# Patient Record
Sex: Male | Born: 1945
Health system: Southern US, Community
[De-identification: ages and names within clinical notes are randomized; demographics above are authoritative.]

## PROBLEM LIST (undated history)

## (undated) DIAGNOSIS — R7303 Prediabetes: Secondary | ICD-10-CM

## (undated) DIAGNOSIS — G479 Sleep disorder, unspecified: Secondary | ICD-10-CM

## (undated) DIAGNOSIS — J45909 Unspecified asthma, uncomplicated: Secondary | ICD-10-CM

## (undated) DIAGNOSIS — F32A Depression, unspecified: Secondary | ICD-10-CM

## (undated) DIAGNOSIS — F329 Major depressive disorder, single episode, unspecified: Secondary | ICD-10-CM

## (undated) DIAGNOSIS — F419 Anxiety disorder, unspecified: Secondary | ICD-10-CM

## (undated) DIAGNOSIS — E785 Hyperlipidemia, unspecified: Secondary | ICD-10-CM

## (undated) DIAGNOSIS — Z87438 Personal history of other diseases of male genital organs: Secondary | ICD-10-CM

## (undated) HISTORY — PX: CIRCUMCISION: SUR203

## (undated) HISTORY — DX: Depression, unspecified: F32.A

## (undated) HISTORY — DX: Prediabetes: R73.03

## (undated) HISTORY — DX: Major depressive disorder, single episode, unspecified: F32.9

## (undated) HISTORY — PX: TONSILLECTOMY: SUR1361

## (undated) HISTORY — PX: OTHER SURGICAL HISTORY: SHX169

## (undated) HISTORY — DX: Personal history of other diseases of male genital organs: Z87.438

## (undated) HISTORY — DX: Unspecified asthma, uncomplicated: J45.909

## (undated) HISTORY — DX: Sleep disorder, unspecified: G47.9

## (undated) HISTORY — DX: Hyperlipidemia, unspecified: E78.5

## (undated) HISTORY — DX: Anxiety disorder, unspecified: F41.9

---

## 2011-08-29 DIAGNOSIS — F329 Major depressive disorder, single episode, unspecified: Secondary | ICD-10-CM | POA: Diagnosis not present

## 2011-09-08 DIAGNOSIS — N400 Enlarged prostate without lower urinary tract symptoms: Secondary | ICD-10-CM | POA: Diagnosis not present

## 2011-10-10 DIAGNOSIS — L570 Actinic keratosis: Secondary | ICD-10-CM | POA: Diagnosis not present

## 2011-10-10 DIAGNOSIS — L821 Other seborrheic keratosis: Secondary | ICD-10-CM | POA: Diagnosis not present

## 2011-11-20 DIAGNOSIS — E559 Vitamin D deficiency, unspecified: Secondary | ICD-10-CM | POA: Diagnosis not present

## 2011-11-20 DIAGNOSIS — Z79899 Other long term (current) drug therapy: Secondary | ICD-10-CM | POA: Diagnosis not present

## 2011-11-20 DIAGNOSIS — Z125 Encounter for screening for malignant neoplasm of prostate: Secondary | ICD-10-CM | POA: Diagnosis not present

## 2011-11-20 DIAGNOSIS — E782 Mixed hyperlipidemia: Secondary | ICD-10-CM | POA: Diagnosis not present

## 2011-11-24 DIAGNOSIS — F329 Major depressive disorder, single episode, unspecified: Secondary | ICD-10-CM | POA: Diagnosis not present

## 2011-12-14 DIAGNOSIS — H2510 Age-related nuclear cataract, unspecified eye: Secondary | ICD-10-CM | POA: Diagnosis not present

## 2011-12-20 DIAGNOSIS — E559 Vitamin D deficiency, unspecified: Secondary | ICD-10-CM | POA: Diagnosis not present

## 2011-12-20 DIAGNOSIS — Z79899 Other long term (current) drug therapy: Secondary | ICD-10-CM | POA: Diagnosis not present

## 2011-12-20 DIAGNOSIS — E782 Mixed hyperlipidemia: Secondary | ICD-10-CM | POA: Diagnosis not present

## 2011-12-20 DIAGNOSIS — Z125 Encounter for screening for malignant neoplasm of prostate: Secondary | ICD-10-CM | POA: Diagnosis not present

## 2012-01-05 DIAGNOSIS — R748 Abnormal levels of other serum enzymes: Secondary | ICD-10-CM | POA: Diagnosis not present

## 2012-01-05 DIAGNOSIS — R7301 Impaired fasting glucose: Secondary | ICD-10-CM | POA: Diagnosis not present

## 2012-01-22 DIAGNOSIS — F329 Major depressive disorder, single episode, unspecified: Secondary | ICD-10-CM | POA: Diagnosis not present

## 2012-03-06 DIAGNOSIS — R7301 Impaired fasting glucose: Secondary | ICD-10-CM | POA: Diagnosis not present

## 2012-03-06 DIAGNOSIS — R7401 Elevation of levels of liver transaminase levels: Secondary | ICD-10-CM | POA: Diagnosis not present

## 2012-05-09 DIAGNOSIS — F329 Major depressive disorder, single episode, unspecified: Secondary | ICD-10-CM | POA: Diagnosis not present

## 2012-05-13 DIAGNOSIS — IMO0002 Reserved for concepts with insufficient information to code with codable children: Secondary | ICD-10-CM | POA: Diagnosis not present

## 2012-07-02 ENCOUNTER — Encounter (HOSPITAL_COMMUNITY): Payer: Self-pay | Admitting: *Deleted

## 2012-07-02 ENCOUNTER — Emergency Department (HOSPITAL_COMMUNITY)
Admission: EM | Admit: 2012-07-02 | Discharge: 2012-07-02 | Disposition: A | Discharge status: Home / Self Care | Payer: Medicare Other | Attending: Emergency Medicine | Admitting: Emergency Medicine

## 2012-07-02 ENCOUNTER — Emergency Department (HOSPITAL_COMMUNITY): Payer: Medicare Other

## 2012-07-02 DIAGNOSIS — K5730 Diverticulosis of large intestine without perforation or abscess without bleeding: Secondary | ICD-10-CM | POA: Diagnosis not present

## 2012-07-02 DIAGNOSIS — R339 Retention of urine, unspecified: Secondary | ICD-10-CM | POA: Diagnosis not present

## 2012-07-02 DIAGNOSIS — I10 Essential (primary) hypertension: Secondary | ICD-10-CM | POA: Insufficient documentation

## 2012-07-02 DIAGNOSIS — L570 Actinic keratosis: Secondary | ICD-10-CM | POA: Diagnosis not present

## 2012-07-02 DIAGNOSIS — N400 Enlarged prostate without lower urinary tract symptoms: Secondary | ICD-10-CM | POA: Diagnosis not present

## 2012-07-02 DIAGNOSIS — K7689 Other specified diseases of liver: Secondary | ICD-10-CM | POA: Diagnosis not present

## 2012-07-02 DIAGNOSIS — R1031 Right lower quadrant pain: Secondary | ICD-10-CM | POA: Diagnosis not present

## 2012-07-02 DIAGNOSIS — Z79899 Other long term (current) drug therapy: Secondary | ICD-10-CM | POA: Diagnosis not present

## 2012-07-02 DIAGNOSIS — R110 Nausea: Secondary | ICD-10-CM | POA: Insufficient documentation

## 2012-07-02 DIAGNOSIS — L738 Other specified follicular disorders: Secondary | ICD-10-CM | POA: Diagnosis not present

## 2012-07-02 DIAGNOSIS — R109 Unspecified abdominal pain: Secondary | ICD-10-CM

## 2012-07-02 LAB — COMPREHENSIVE METABOLIC PANEL
ALT: 74 U/L — ABNORMAL HIGH (ref 0–53)
AST: 85 U/L — ABNORMAL HIGH (ref 0–37)
Albumin: 4 g/dL (ref 3.5–5.2)
Calcium: 9.5 mg/dL (ref 8.4–10.5)
Creatinine, Ser: 1.44 mg/dL — ABNORMAL HIGH (ref 0.50–1.35)
Sodium: 138 mEq/L (ref 135–145)
Total Protein: 7.5 g/dL (ref 6.0–8.3)

## 2012-07-02 LAB — CBC WITH DIFFERENTIAL/PLATELET
Basophils Absolute: 0.1 10*3/uL (ref 0.0–0.1)
Basophils Relative: 1 % (ref 0–1)
Eosinophils Absolute: 0.3 10*3/uL (ref 0.0–0.7)
Eosinophils Relative: 4 % (ref 0–5)
Lymphocytes Relative: 23 % (ref 12–46)
MCH: 31.8 pg (ref 26.0–34.0)
MCHC: 34.6 g/dL (ref 30.0–36.0)
MCV: 92 fL (ref 78.0–100.0)
Platelets: 292 10*3/uL (ref 150–400)
RDW: 12.9 % (ref 11.5–15.5)
WBC: 6.3 10*3/uL (ref 4.0–10.5)

## 2012-07-02 LAB — URINALYSIS, ROUTINE W REFLEX MICROSCOPIC
Bilirubin Urine: NEGATIVE
Hgb urine dipstick: NEGATIVE
Nitrite: NEGATIVE
Specific Gravity, Urine: 1.011 (ref 1.005–1.030)
pH: 7 (ref 5.0–8.0)

## 2012-07-02 MED ORDER — ONDANSETRON HCL 8 MG PO TABS: 8.0000 mg | ORAL_TABLET | Freq: Three times a day (TID) | ORAL | Status: DC | PRN

## 2012-07-02 MED ORDER — IOHEXOL 300 MG/ML  SOLN
100.0000 mL | Freq: Once | INTRAMUSCULAR | Status: AC | PRN
  Administered 2012-07-02: 100 mL via INTRAVENOUS

## 2012-07-02 MED ORDER — MORPHINE SULFATE 4 MG/ML IJ SOLN
4.0000 mg | Freq: Once | INTRAMUSCULAR | Status: AC
  Administered 2012-07-02: 4 mg via INTRAVENOUS
  Filled 2012-07-02: qty 1

## 2012-07-02 MED ORDER — ONDANSETRON HCL 4 MG/2ML IJ SOLN
4.0000 mg | Freq: Once | INTRAMUSCULAR | Status: AC
  Administered 2012-07-02: 4 mg via INTRAVENOUS
  Filled 2012-07-02: qty 2

## 2012-07-02 MED ORDER — LORAZEPAM 2 MG/ML IJ SOLN
0.5000 mg | Freq: Once | INTRAMUSCULAR | Status: AC
  Administered 2012-07-02: 0.5 mg via INTRAVENOUS
  Filled 2012-07-02: qty 1

## 2012-07-02 MED ORDER — FENTANYL CITRATE 0.05 MG/ML IJ SOLN
50.0000 ug | Freq: Once | INTRAMUSCULAR | Status: AC
  Administered 2012-07-02: 50 ug via INTRAVENOUS
  Filled 2012-07-02: qty 2

## 2012-07-02 MED ORDER — OXYCODONE-ACETAMINOPHEN 5-325 MG PO TABS
1.0000 | ORAL_TABLET | ORAL | Status: DC | PRN
Start: 2012-07-02 — End: 2013-07-28

## 2012-07-02 NOTE — ED Notes (Signed)
MD at bedside. 

## 2012-07-02 NOTE — ED Provider Notes (Signed)
History     CSN: [id number]  Arrival date & time 07/02/12  1932   First MD Initiated Contact with Patient 07/02/12 2020      No chief complaint on file.   (Consider location/radiation/quality/duration/timing/severity/associated sxs/prior treatment) HPI History provided by pt.   Pt has had intermittent, non-radiating, sharp pain in RLQ for the past three days.  Gradually increasing in intensity and frequency.  Seems to be aggravated by laying flat as well as movement.  Associated w/ nausea and urinary retention.  Denies fever, anorexia, change in bowels.  H/o BPH and HTN.  No past abd surgeries.  No recent trauma or heavy lifting.   History reviewed. No pertinent past medical history.  History reviewed. No pertinent past surgical history.  No family history on file.  History  Substance Use Topics  . Smoking status: Never Smoker   . Smokeless tobacco: Not on file  . Alcohol Use: 1.8 oz/week    3 Glasses of wine per week      Review of Systems  All other systems reviewed and are negative.    Allergies  Review of patient's allergies indicates no known allergies.  Home Medications   Current Outpatient Rx  Name  Route  Sig  Dispense  Refill  . ALFUZOSIN HCL ER 10 MG PO TB24   Oral   Take 10 mg by mouth daily.         . FENOFIBRATE 160 MG PO TABS   Oral   Take 160 mg by mouth daily.         Marland Kitchen FINASTERIDE 5 MG PO TABS   Oral   Take 5 mg by mouth daily.         Marland Kitchen HYDROXYZINE HCL 10 MG PO TABS   Oral   Take 10 mg by mouth 3 (three) times daily as needed.         Marland Kitchen LORAZEPAM 1 MG PO TABS   Oral   Take 1 mg by mouth at bedtime.         Marland Kitchen VITAMIN D (ERGOCALCIFEROL) 50000 UNITS PO CAPS   Oral   Take 50,000 Units by mouth every 7 (seven) days. Take on Sunday           BP 136/91  Pulse 102  Temp 98 F (36.7 C) (Oral)  Resp 19  SpO2 94%  Physical Exam  Nursing note and vitals reviewed. Constitutional: He is oriented to person, place, and time.  He appears well-developed and well-nourished. No distress.  HENT:  Head: Normocephalic and atraumatic.  Eyes:       Normal appearance  Neck: Normal range of motion.  Cardiovascular: Normal rate and regular rhythm.   Pulmonary/Chest: Effort normal and breath sounds normal. No respiratory distress.  Abdominal: Soft. Bowel sounds are normal. He exhibits no distension and no mass. There is no rebound and no guarding.       Slight tenderness RLQ  Genitourinary:       No CVA tenderness  Musculoskeletal: Normal range of motion.  Neurological: He is alert and oriented to person, place, and time.  Skin: Skin is warm and dry. No rash noted.  Psychiatric: He has a normal mood and affect. His behavior is normal.    ED Course  Procedures (including critical care time)  Labs Reviewed  CBC WITH DIFFERENTIAL - Abnormal; Notable for the following:    Monocytes Relative 14 (*)     All other components within normal limits  COMPREHENSIVE METABOLIC PANEL -  Abnormal; Notable for the following:    Glucose, Bld 118 (*)     Creatinine, Ser 1.44 (*)     AST 85 (*)     ALT 74 (*)     GFR calc non Af Amer 48 (*)     GFR calc Af Amer 56 (*)     All other components within normal limits  URINALYSIS, ROUTINE W REFLEX MICROSCOPIC   Ct Abdomen Pelvis W Contrast  07/02/2012  *RADIOLOGY REPORT*  Clinical Data: Right lower abdominal pain.  Nausea.  Evaluate for appendicitis.  CT ABDOMEN AND PELVIS WITH CONTRAST  Technique:  Multidetector CT imaging of the abdomen and pelvis was performed following the standard protocol during bolus administration of intravenous contrast.  Contrast: OMNIPAQUE IOHEXOL 300 MG/ML  SOLN  Comparison: No priors.  Findings:  Lung Bases: Unremarkable.  Abdomen/Pelvis:  Diffusely decreased attenuation throughout the hepatic parenchyma is compatible with hepatic steatosis, with some focal fatty sparing adjacent to the gallbladder fossa.  The appearance of the gallbladder, pancreas,  spleen, bilateral adrenal glands and the right kidney is unremarkable.  2.2 cm simple cyst in the upper pole left kidney.  Subcentimeter low attenuation lesion in the lower pole of the left kidney is too small to definitively characterize, but is statistically likely to represent a small cyst.  There also appear to be some small peripelvic cysts in the left renal hilum.  The appendix is normal.  No ascites or pneumoperitoneum and no pathologic distension of small bowel.  No definite pathologic lymphadenopathy identified within the abdomen or pelvis.  There are a few colonic diverticula, without surrounding inflammatory changes to suggest an acute diverticulitis at this time.  Hypertrophy of the median lobe of the prostate gland is noted.  Urinary bladder is unremarkable in appearance.  Musculoskeletal: There are no aggressive appearing lytic or blastic lesions noted in the visualized portions of the skeleton.  IMPRESSION: 1.  No acute abnormality in the abdomen or pelvis to account for the patient's symptoms.  Specifically, the appendix is normal. 2.  Hepatic steatosis. 3.  Mild colonic diverticulosis without findings to suggest acute diverticulitis. 4.  Additional incidental findings, as above.   Original Report Authenticated By: Trudie Reed, M.D.      1. Abdominal pain       MDM  [phone] M w/ h/o BPH and HTN, otherwise healthy, presents w/ c/o non-traumatic RLQ pain.  Low clinical suspicion for appendicitis, but CT abd/pelvis ordered d/t patient's age and his level of concern.  Labs sig for mild elevation of transaminases, likely secondary to alcohol intake, and CT neg for acute pathology. Results discussed w/ pt.  He is frustrated that we were unable to determine etiology of pain, but I suspect he has a muscle strain.  Prescribed percocet and recommended he take his daily prednisone, apply heat and avoid activities that aggravate pain.  He has a f/u appointment scheduled with his PCP tomorrow.   Referred to GI for persistent sx as well.  Return precautions discussed.         Otilio Miu, PA-C 07/03/12 0106

## 2012-07-02 NOTE — ED Provider Notes (Signed)
Eduardo Watts is a 66 y.o. male with right lower abdominal pain, for 3 days associated with nausea. No change in bowel or urinary habits. Abdomen is soft is tender. The right lower quadrant without rebound, tenderness.  ED evaluation is reassuring for lack of acute intra-abdominal processes. Doubt metabolic instability, serious bacterial infection or impending vascular collapse; the patient is stable for discharge.  Medical screening examination/treatment/procedure(s) were conducted as a shared visit with non-physician practitioner(s) and myself.  I personally evaluated the patient during the encounter  Flint Melter, MD 07/03/12 [phone]

## 2012-07-02 NOTE — ED Notes (Signed)
Pt c/o lower right localized abd pain x 3 days; worse x 3 hours; nausea; increased urination; pain does not radiate to back or groin

## 2012-07-02 NOTE — ED Notes (Signed)
Patient transported to CT 

## 2012-07-03 DIAGNOSIS — E119 Type 2 diabetes mellitus without complications: Secondary | ICD-10-CM | POA: Diagnosis not present

## 2012-07-03 DIAGNOSIS — R7401 Elevation of levels of liver transaminase levels: Secondary | ICD-10-CM | POA: Diagnosis not present

## 2012-07-03 DIAGNOSIS — R1031 Right lower quadrant pain: Secondary | ICD-10-CM | POA: Diagnosis not present

## 2012-07-06 NOTE — ED Provider Notes (Signed)
Medical screening examination/treatment/procedure(s) were performed by non-physician practitioner and as supervising physician I was immediately available for consultation/collaboration.  Flint Melter, MD 07/06/12 [phone]

## 2012-07-11 DIAGNOSIS — M720 Palmar fascial fibromatosis [Dupuytren]: Secondary | ICD-10-CM | POA: Diagnosis not present

## 2012-07-17 DIAGNOSIS — M545 Low back pain: Secondary | ICD-10-CM | POA: Diagnosis not present

## 2012-07-17 DIAGNOSIS — M47817 Spondylosis without myelopathy or radiculopathy, lumbosacral region: Secondary | ICD-10-CM | POA: Diagnosis not present

## 2012-07-17 DIAGNOSIS — M25559 Pain in unspecified hip: Secondary | ICD-10-CM | POA: Diagnosis not present

## 2012-07-17 DIAGNOSIS — IMO0002 Reserved for concepts with insufficient information to code with codable children: Secondary | ICD-10-CM | POA: Diagnosis not present

## 2012-07-29 DIAGNOSIS — F329 Major depressive disorder, single episode, unspecified: Secondary | ICD-10-CM | POA: Diagnosis not present

## 2012-10-02 DIAGNOSIS — R7401 Elevation of levels of liver transaminase levels: Secondary | ICD-10-CM | POA: Diagnosis not present

## 2012-10-02 DIAGNOSIS — E119 Type 2 diabetes mellitus without complications: Secondary | ICD-10-CM | POA: Diagnosis not present

## 2012-11-07 DIAGNOSIS — F329 Major depressive disorder, single episode, unspecified: Secondary | ICD-10-CM | POA: Diagnosis not present

## 2013-01-01 DIAGNOSIS — E559 Vitamin D deficiency, unspecified: Secondary | ICD-10-CM | POA: Diagnosis not present

## 2013-01-01 DIAGNOSIS — E119 Type 2 diabetes mellitus without complications: Secondary | ICD-10-CM | POA: Diagnosis not present

## 2013-01-01 DIAGNOSIS — R7401 Elevation of levels of liver transaminase levels: Secondary | ICD-10-CM | POA: Diagnosis not present

## 2013-01-01 DIAGNOSIS — Z125 Encounter for screening for malignant neoplasm of prostate: Secondary | ICD-10-CM | POA: Diagnosis not present

## 2013-01-01 DIAGNOSIS — I10 Essential (primary) hypertension: Secondary | ICD-10-CM | POA: Diagnosis not present

## 2013-01-01 DIAGNOSIS — R6882 Decreased libido: Secondary | ICD-10-CM | POA: Diagnosis not present

## 2013-01-13 DIAGNOSIS — H2510 Age-related nuclear cataract, unspecified eye: Secondary | ICD-10-CM | POA: Diagnosis not present

## 2013-02-14 DIAGNOSIS — I789 Disease of capillaries, unspecified: Secondary | ICD-10-CM | POA: Diagnosis not present

## 2013-02-14 DIAGNOSIS — L821 Other seborrheic keratosis: Secondary | ICD-10-CM | POA: Diagnosis not present

## 2013-02-14 DIAGNOSIS — L820 Inflamed seborrheic keratosis: Secondary | ICD-10-CM | POA: Diagnosis not present

## 2013-02-14 DIAGNOSIS — L723 Sebaceous cyst: Secondary | ICD-10-CM | POA: Diagnosis not present

## 2013-02-14 DIAGNOSIS — D1801 Hemangioma of skin and subcutaneous tissue: Secondary | ICD-10-CM | POA: Diagnosis not present

## 2013-03-28 DIAGNOSIS — F329 Major depressive disorder, single episode, unspecified: Secondary | ICD-10-CM | POA: Diagnosis not present

## 2013-04-11 DIAGNOSIS — N400 Enlarged prostate without lower urinary tract symptoms: Secondary | ICD-10-CM | POA: Diagnosis not present

## 2013-05-05 DIAGNOSIS — R7402 Elevation of levels of lactic acid dehydrogenase (LDH): Secondary | ICD-10-CM | POA: Diagnosis not present

## 2013-05-05 DIAGNOSIS — K7689 Other specified diseases of liver: Secondary | ICD-10-CM | POA: Diagnosis not present

## 2013-05-05 DIAGNOSIS — F329 Major depressive disorder, single episode, unspecified: Secondary | ICD-10-CM | POA: Diagnosis not present

## 2013-05-05 DIAGNOSIS — R7401 Elevation of levels of liver transaminase levels: Secondary | ICD-10-CM | POA: Diagnosis not present

## 2013-06-04 DIAGNOSIS — Z23 Encounter for immunization: Secondary | ICD-10-CM | POA: Diagnosis not present

## 2013-06-18 DIAGNOSIS — F329 Major depressive disorder, single episode, unspecified: Secondary | ICD-10-CM | POA: Diagnosis not present

## 2013-07-21 DIAGNOSIS — E782 Mixed hyperlipidemia: Secondary | ICD-10-CM | POA: Diagnosis not present

## 2013-07-21 DIAGNOSIS — E559 Vitamin D deficiency, unspecified: Secondary | ICD-10-CM | POA: Diagnosis not present

## 2013-07-21 DIAGNOSIS — R9431 Abnormal electrocardiogram [ECG] [EKG]: Secondary | ICD-10-CM | POA: Diagnosis not present

## 2013-07-21 DIAGNOSIS — Z0000 Encounter for general adult medical examination without abnormal findings: Secondary | ICD-10-CM | POA: Diagnosis not present

## 2013-07-21 DIAGNOSIS — I10 Essential (primary) hypertension: Secondary | ICD-10-CM | POA: Diagnosis not present

## 2013-07-21 DIAGNOSIS — E119 Type 2 diabetes mellitus without complications: Secondary | ICD-10-CM | POA: Diagnosis not present

## 2013-07-26 ENCOUNTER — Encounter: Payer: Self-pay | Admitting: Interventional Cardiology

## 2013-07-28 ENCOUNTER — Ambulatory Visit (INDEPENDENT_AMBULATORY_CARE_PROVIDER_SITE_OTHER): Payer: Medicare Other | Admitting: Interventional Cardiology

## 2013-07-28 ENCOUNTER — Encounter: Payer: Self-pay | Admitting: Cardiology

## 2013-07-28 ENCOUNTER — Encounter: Payer: Self-pay | Admitting: Interventional Cardiology

## 2013-07-28 VITALS — BP 139/90 | HR 89 | Ht 74.0 in | Wt 210.4 lb

## 2013-07-28 DIAGNOSIS — R9431 Abnormal electrocardiogram [ECG] [EKG]: Secondary | ICD-10-CM | POA: Insufficient documentation

## 2013-07-28 DIAGNOSIS — R0602 Shortness of breath: Secondary | ICD-10-CM

## 2013-07-28 DIAGNOSIS — E782 Mixed hyperlipidemia: Secondary | ICD-10-CM | POA: Diagnosis not present

## 2013-07-28 DIAGNOSIS — R5381 Other malaise: Secondary | ICD-10-CM

## 2013-07-28 LAB — BRAIN NATRIURETIC PEPTIDE: Pro B Natriuretic peptide (BNP): 28 pg/mL (ref 0.0–100.0)

## 2013-07-28 NOTE — Patient Instructions (Signed)
Your physician has requested that you have an echocardiogram. Echocardiography is a painless test that uses sound waves to create images of your heart. It provides your doctor with information about the size and shape of your heart and how well your heart's chambers and valves are working. This procedure takes approximately one hour. There are no restrictions for this procedure.  Your physician recommends that you return for lab work today for BNP.

## 2013-07-28 NOTE — Progress Notes (Signed)
Patient ID: Eduardo Watts, male   DOB: 1945/08/11, 67 y.o.   MRN: [id number]     Patient ID: Eduardo Watts MRN: [id number] DOB/AGE: May 22, 1946 67 y.o.   Referring Physician  Dr. Tenny Craw   Reason for Consultation Abnormal ECG  HPI: 73 y/o who had a routine ECG which was abnormal.  Eduardo Watts work is his most strenuous exercise.  He feels that he tires more quickly.  He attributes it to age.  He does not walk much.  He is very claustrophobic and scared of a machines that enclose him.  He did have a nuclear stress test and did not tolerate the camera about 12 years ago, so it was stopped.  He feels his heart pounding with anxiety.  He has some SHOB with working in the yard.  He was trimming some shrubs and could feel it.  His wife could feel it.  No leg swelling.  Mother had a pacer.  No stents or CABG in the family.    Current Outpatient Prescriptions  Medication Sig Dispense Refill  . alfuzosin (UROXATRAL) 10 MG 24 hr tablet Take 10 mg by mouth daily.      . B Complex Vitamins (B COMPLEX PO) Take by mouth.      . carvedilol (COREG) 3.125 MG tablet Take 3.125 mg by mouth 2 (two) times daily with a meal.      . Cholecalciferol (VITAMIN D3) 2000 UNITS TABS Take by mouth.      . cyclobenzaprine (FLEXERIL) 10 MG tablet Take 10 mg by mouth 3 (three) times daily as needed for muscle spasms.      . fenofibrate 160 MG tablet Take 160 mg by mouth daily.      . finasteride (PROSCAR) 5 MG tablet Take 5 mg by mouth daily.      . fluticasone (FLONASE) 50 MCG/ACT nasal spray Place into both nostrils daily.      . hydrOXYzine (ATARAX/VISTARIL) 10 MG tablet Take 10 mg by mouth 3 (three) times daily as needed.      Marland Kitchen KRILL OIL PO Take 500 mg by mouth.      Marland Kitchen LORazepam (ATIVAN) 1 MG tablet Take 1 mg by mouth at bedtime.      . Omega-3 Fatty Acids (FISH OIL) 1200 MG CAPS Take by mouth.      . Plant Sterols and Stanols (CHOLEST OFF PO) Take 450 mg by mouth.      Marland Kitchen TWINRIX 720-20 injection       . Vitamin D,  Ergocalciferol, (DRISDOL) 50000 UNITS CAPS Take 50,000 Units by mouth every 7 (seven) days. Take on Sunday       No current facility-administered medications for this visit.   Past Medical History  Diagnosis Date  . Depression     sees Dr Jennelle Human  . Sleep disturbances   . Hyperlipidemia   . History of BPH     Dr.Dahlstedt  . Asthma     as a child    Family History  Problem Relation Age of Onset  . Dementia Mother   . Cancer - Prostate Father   . Hyperlipidemia Sister     high cholestrol    History   Social History  . Marital Status: Unknown    Spouse Name: N/A    Number of Children: N/A  . Years of Education: N/A   Occupational History  . Not on file.   Social History Main Topics  . Smoking status: Never Smoker   . Smokeless tobacco: Not  on file  . Alcohol Use: 1.8 oz/week    3 Glasses of wine per week  . Drug Use: Not on file  . Sexual Activity: Not on file   Other Topics Concern  . Not on file   Social History Narrative   Never smoked   Alcohol yes -drinking on the weekends/2 glasses of wine with dinner   Caffeine- yes   Recreational drug use -No   Diet- fruits and vegetables,less fried food more pasta   Exercise- decrease to fatigue ,does yard work,and  walks occasionally    Occupation -employed Airline pilot   Married 1 daughter/ 1 granddaughter   Goes by Eduardo Watts" loves animals    Past Surgical History  Procedure Laterality Date  . Tonsillectomy        (Not in a hospital admission)  Review of systems complete and found to be negative unless listed above .  No nausea, vomiting.  No fever chills, No focal weakness,  No palpitations.  Physical Exam: Filed Vitals:   07/28/13 0839  BP: 139/90  Pulse: 89    Weight: 210 lb 6.4 oz (95.437 kg)  Physical exam:  [place]/AT EOMI No JVD, No carotid bruit RRR S1S2  No wheezing Soft. NT, nondistended No edema. No focal motor or sensory deficits Normal affect  Labs:   Lab Results  Component Value Date   WBC  6.3 07/02/2012   HGB 15.5 07/02/2012   HCT 44.8 07/02/2012   MCV 92.0 07/02/2012   PLT 292 07/02/2012   No results found for this basename: NA, K, CL, CO2, BUN, CREATININE, CALCIUM, LABALBU, PROT, BILITOT, ALKPHOS, ALT, AST, GLUCOSE,  in the last 168 hours No results found for this basename: CKTOTAL, CKMB, CKMBINDEX, TROPONINI    No results found for this basename: CHOL   No results found for this basename: HDL   No results found for this basename: LDLCALC   No results found for this basename: TRIG   No results found for this basename: CHOLHDL   No results found for this basename: LDLDIRECT      Radiology: Abdominal CT in 11/13: Fatty liver EKG: Normal sinus rhythm, Q wave in lead 3. T-wave inversions anteriorly, diffuse T-wave flattening in other leads.  ASSESSMENT AND PLAN:  Shortness of breath:  History of alcohol use. There is some mild iron overload as well based on the old records. We'll need to evaluate echocardiogram for evidence of systolic dysfunction. He apparently does not have full blown hemochromatosis. He has never been treated with phlebotomy.  Evaluate for fluid overload. Check BNP.   Abnormal ECG: Nonspecific ST segment changes. T-wave flattening and T-wave inversion. Q wave in lead 3.  Check echocardiogram to evaluate for structural heart disease.  Fatigue:  May be related to deconditioning. Will evaluate for fluid overload as well.  Hyperlipidemia:  Continue Fenofibrate.  LDL 214. TG 227.  May need statin for LDL lowering. His liver function will be an issue.  Signed: All Her Fredric Mare, MD, [facility] 07/28/2013, 9:00 AM

## 2013-08-08 ENCOUNTER — Ambulatory Visit (HOSPITAL_COMMUNITY): Payer: Medicare Other | Attending: Cardiology | Admitting: Radiology

## 2013-08-08 ENCOUNTER — Encounter: Payer: Self-pay | Admitting: Cardiology

## 2013-08-08 DIAGNOSIS — E785 Hyperlipidemia, unspecified: Secondary | ICD-10-CM | POA: Insufficient documentation

## 2013-08-08 DIAGNOSIS — R5381 Other malaise: Secondary | ICD-10-CM | POA: Insufficient documentation

## 2013-08-08 DIAGNOSIS — R9431 Abnormal electrocardiogram [ECG] [EKG]: Secondary | ICD-10-CM | POA: Diagnosis not present

## 2013-08-08 DIAGNOSIS — R5383 Other fatigue: Secondary | ICD-10-CM

## 2013-08-08 DIAGNOSIS — R0602 Shortness of breath: Secondary | ICD-10-CM | POA: Diagnosis not present

## 2013-08-08 NOTE — Progress Notes (Signed)
Echocardiogram performed.  

## 2013-08-15 DIAGNOSIS — L570 Actinic keratosis: Secondary | ICD-10-CM | POA: Diagnosis not present

## 2013-08-15 DIAGNOSIS — L821 Other seborrheic keratosis: Secondary | ICD-10-CM | POA: Diagnosis not present

## 2013-08-15 DIAGNOSIS — L819 Disorder of pigmentation, unspecified: Secondary | ICD-10-CM | POA: Diagnosis not present

## 2013-08-15 DIAGNOSIS — L820 Inflamed seborrheic keratosis: Secondary | ICD-10-CM | POA: Diagnosis not present

## 2013-08-15 DIAGNOSIS — L988 Other specified disorders of the skin and subcutaneous tissue: Secondary | ICD-10-CM | POA: Diagnosis not present

## 2013-08-15 DIAGNOSIS — D1801 Hemangioma of skin and subcutaneous tissue: Secondary | ICD-10-CM | POA: Diagnosis not present

## 2013-08-15 DIAGNOSIS — D485 Neoplasm of uncertain behavior of skin: Secondary | ICD-10-CM | POA: Diagnosis not present

## 2013-09-02 ENCOUNTER — Encounter: Payer: Self-pay | Admitting: Interventional Cardiology

## 2013-09-18 DIAGNOSIS — F3289 Other specified depressive episodes: Secondary | ICD-10-CM | POA: Diagnosis not present

## 2013-09-18 DIAGNOSIS — F329 Major depressive disorder, single episode, unspecified: Secondary | ICD-10-CM | POA: Diagnosis not present

## 2013-10-09 DIAGNOSIS — E119 Type 2 diabetes mellitus without complications: Secondary | ICD-10-CM | POA: Diagnosis not present

## 2013-10-09 DIAGNOSIS — I10 Essential (primary) hypertension: Secondary | ICD-10-CM | POA: Diagnosis not present

## 2013-10-17 DIAGNOSIS — F329 Major depressive disorder, single episode, unspecified: Secondary | ICD-10-CM | POA: Diagnosis not present

## 2013-10-17 DIAGNOSIS — F3289 Other specified depressive episodes: Secondary | ICD-10-CM | POA: Diagnosis not present

## 2013-10-27 DIAGNOSIS — K7689 Other specified diseases of liver: Secondary | ICD-10-CM | POA: Diagnosis not present

## 2013-10-27 DIAGNOSIS — R7989 Other specified abnormal findings of blood chemistry: Secondary | ICD-10-CM | POA: Diagnosis not present

## 2013-10-27 DIAGNOSIS — Z23 Encounter for immunization: Secondary | ICD-10-CM | POA: Diagnosis not present

## 2013-11-04 DIAGNOSIS — G909 Disorder of the autonomic nervous system, unspecified: Secondary | ICD-10-CM | POA: Diagnosis not present

## 2013-11-04 DIAGNOSIS — M720 Palmar fascial fibromatosis [Dupuytren]: Secondary | ICD-10-CM | POA: Diagnosis not present

## 2014-01-09 DIAGNOSIS — F329 Major depressive disorder, single episode, unspecified: Secondary | ICD-10-CM | POA: Diagnosis not present

## 2014-01-09 DIAGNOSIS — F3289 Other specified depressive episodes: Secondary | ICD-10-CM | POA: Diagnosis not present

## 2014-02-04 DIAGNOSIS — R0609 Other forms of dyspnea: Secondary | ICD-10-CM | POA: Diagnosis not present

## 2014-02-04 DIAGNOSIS — R7989 Other specified abnormal findings of blood chemistry: Secondary | ICD-10-CM | POA: Diagnosis not present

## 2014-02-04 DIAGNOSIS — I10 Essential (primary) hypertension: Secondary | ICD-10-CM | POA: Diagnosis not present

## 2014-02-04 DIAGNOSIS — Z23 Encounter for immunization: Secondary | ICD-10-CM | POA: Diagnosis not present

## 2014-02-04 DIAGNOSIS — E119 Type 2 diabetes mellitus without complications: Secondary | ICD-10-CM | POA: Diagnosis not present

## 2014-02-04 DIAGNOSIS — R0989 Other specified symptoms and signs involving the circulatory and respiratory systems: Secondary | ICD-10-CM | POA: Diagnosis not present

## 2014-02-04 DIAGNOSIS — E782 Mixed hyperlipidemia: Secondary | ICD-10-CM | POA: Diagnosis not present

## 2014-02-27 DIAGNOSIS — L570 Actinic keratosis: Secondary | ICD-10-CM | POA: Diagnosis not present

## 2014-02-27 DIAGNOSIS — L821 Other seborrheic keratosis: Secondary | ICD-10-CM | POA: Diagnosis not present

## 2014-02-27 DIAGNOSIS — L820 Inflamed seborrheic keratosis: Secondary | ICD-10-CM | POA: Diagnosis not present

## 2014-02-27 DIAGNOSIS — D1801 Hemangioma of skin and subcutaneous tissue: Secondary | ICD-10-CM | POA: Diagnosis not present

## 2014-03-02 ENCOUNTER — Ambulatory Visit: Payer: No Typology Code available for payment source | Admitting: Physician Assistant

## 2014-03-10 DIAGNOSIS — F3289 Other specified depressive episodes: Secondary | ICD-10-CM | POA: Diagnosis not present

## 2014-03-10 DIAGNOSIS — F329 Major depressive disorder, single episode, unspecified: Secondary | ICD-10-CM | POA: Diagnosis not present

## 2014-03-19 IMAGING — CT CT ABD-PELV W/ CM
1 of 3 series · 14 of 32 positions shown, 19 images · IV contrast (omnipaque)
Comparison: No priors.

CLINICAL DATA: Right lower abdominal pain.  Nausea.  Evaluate for
appendicitis.

CT ABDOMEN AND PELVIS WITH CONTRAST
TECHNIQUE: Multidetector CT imaging of the abdomen and pelvis was
performed following the standard protocol during bolus
administration of intravenous contrast.
Contrast: 100mL OMNIPAQUE IOHEXOL 300 MG/ML  SOLN

[Series 2: abd/pel with · axial · 0.81mm/px · z∈[-642,-217]mm · 14 of 97 slices shown, 19 images]
[im 6/97  soft-tissue]
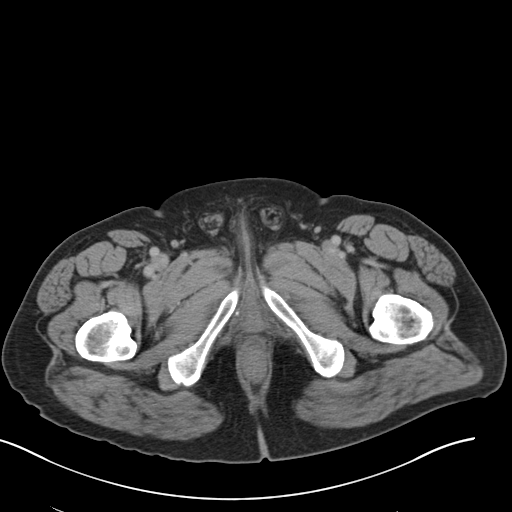
[im 6/97  bone]
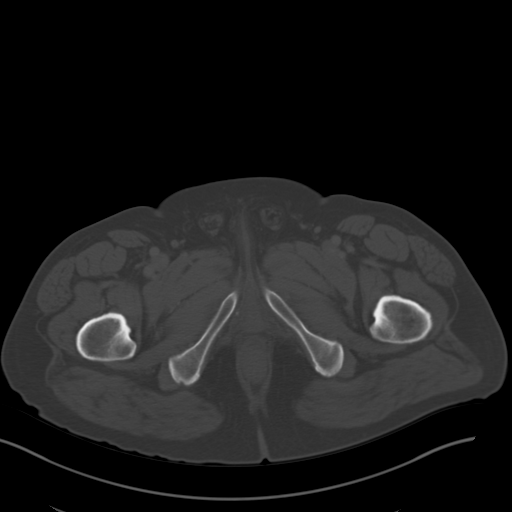
[im 16/97  soft-tissue]
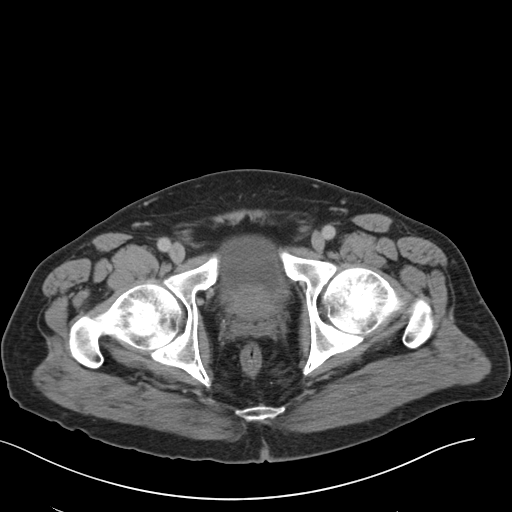
[im 21/97  soft-tissue]
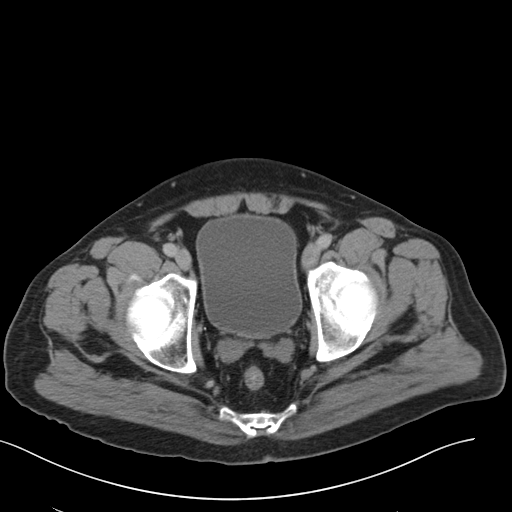
[im 26/97  soft-tissue]
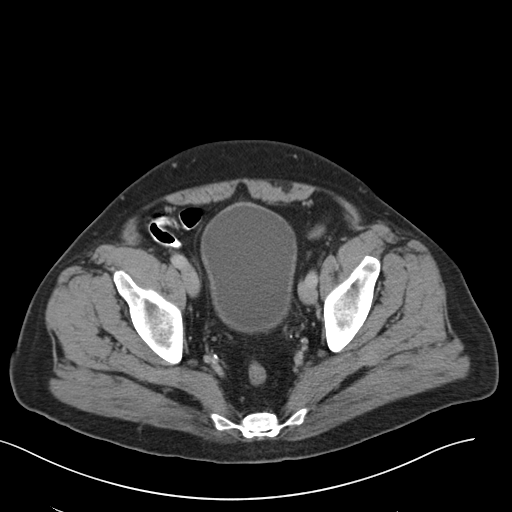
[im 36/97  soft-tissue]
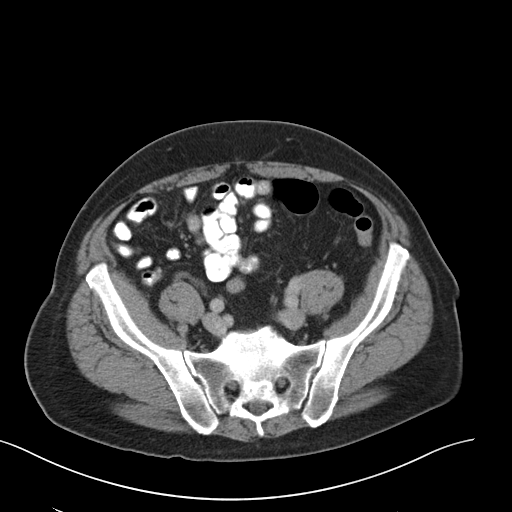
[im 41/97  soft-tissue]
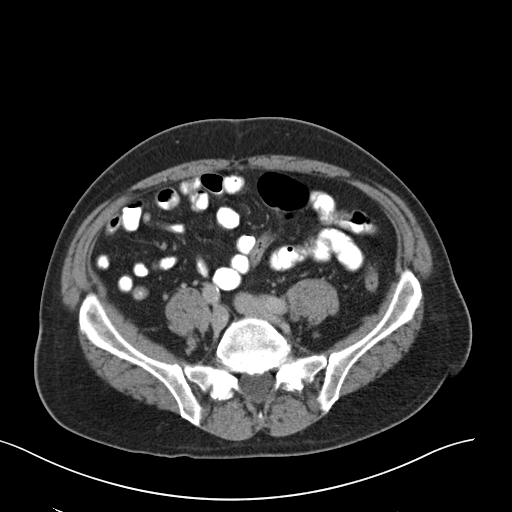
[im 51/97  soft-tissue]
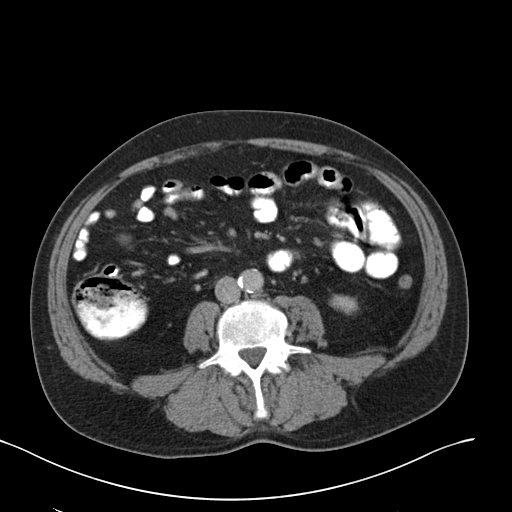
[im 56/97  soft-tissue]
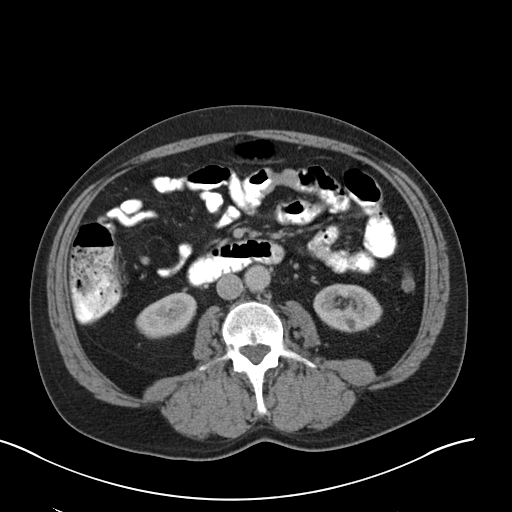
[im 61/97  soft-tissue]
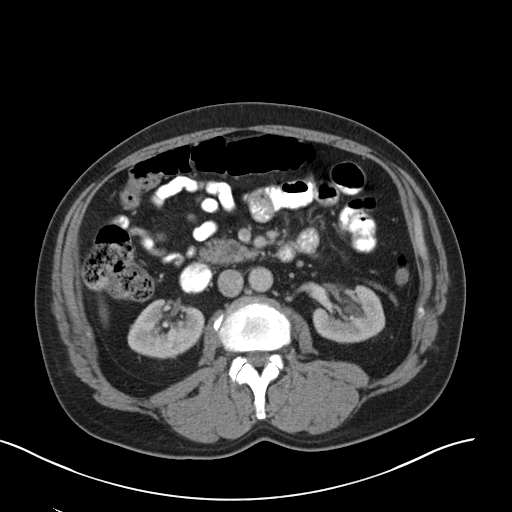
[im 61/97  bone]
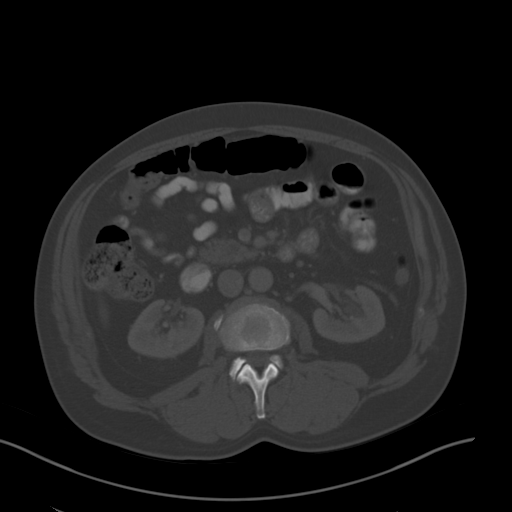
[im 71/97  soft-tissue]
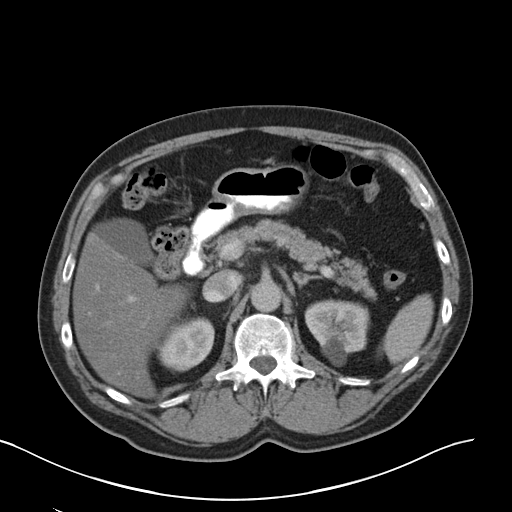
[im 76/97  soft-tissue]
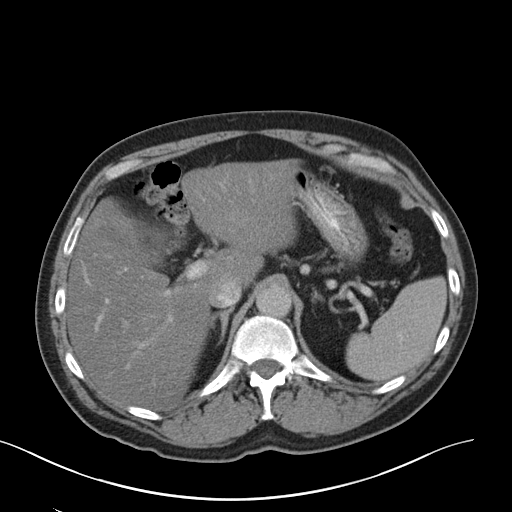
[im 76/97  lung]
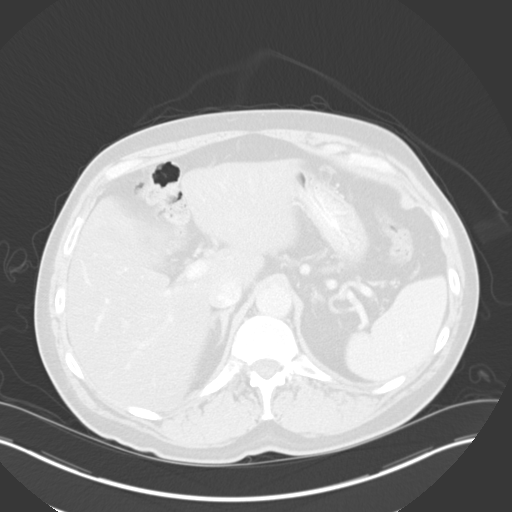
[im 81/97  soft-tissue]
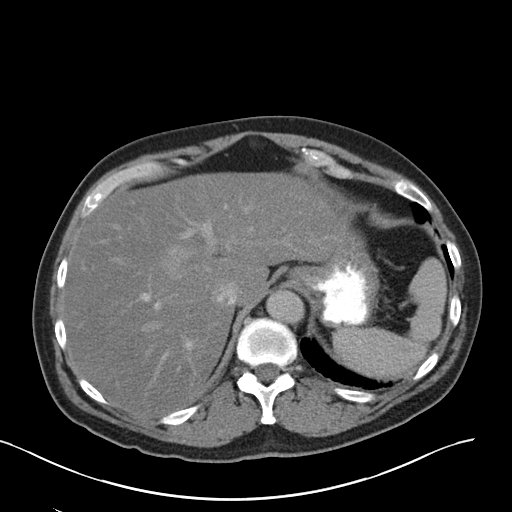
[im 81/97  lung]
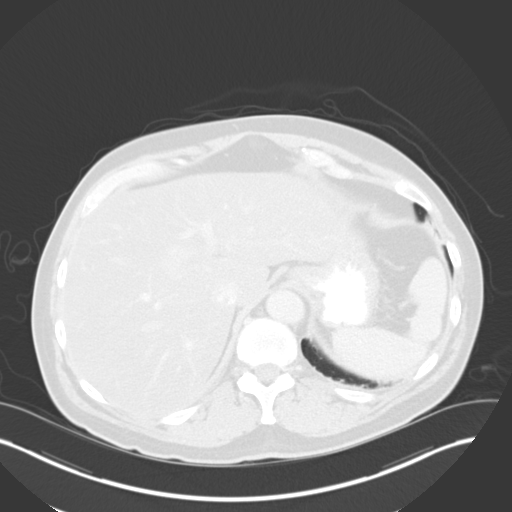
[im 86/97  lung]
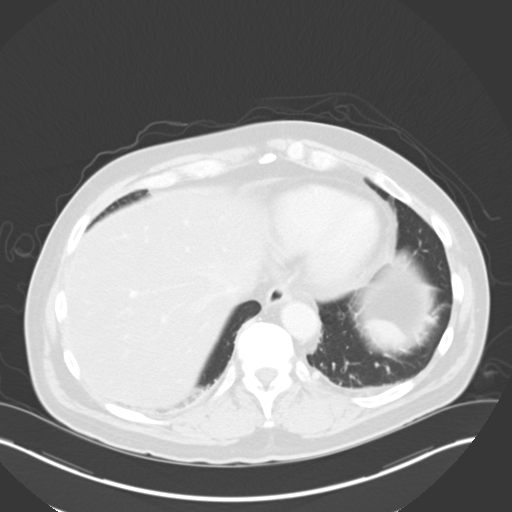
[im 91/97  soft-tissue]
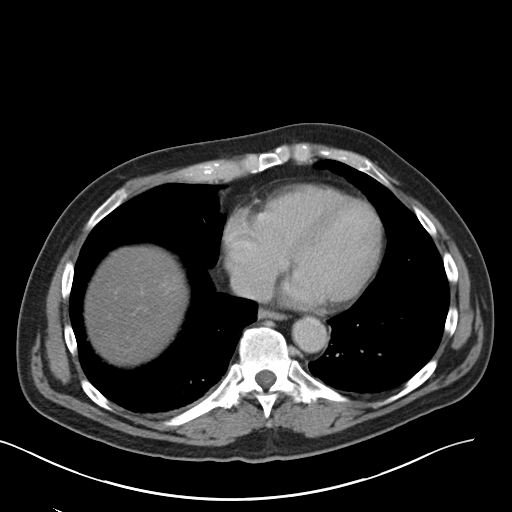
[im 91/97  lung]
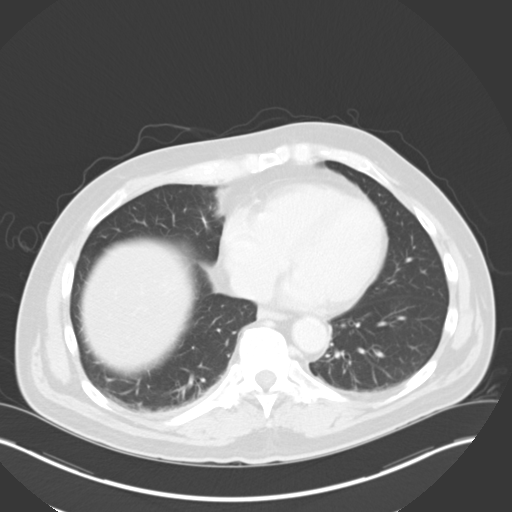

[14 of 32 positions shown; findings below may reference images not displayed]

FINDINGS: Lung Bases: Unremarkable.

Abdomen/Pelvis:  Diffusely decreased attenuation throughout the
hepatic parenchyma is compatible with hepatic steatosis, with some
focal fatty sparing adjacent to the gallbladder fossa.  The
appearance of the gallbladder, pancreas, spleen, bilateral adrenal
glands and the right kidney is unremarkable.  2.2 cm simple cyst in
the upper pole left kidney.  Subcentimeter low attenuation lesion
in the lower pole of the left kidney is too small to definitively
characterize, but is statistically likely to represent a small
cyst.  There also appear to be some small peripelvic cysts in the
left renal hilum.

The appendix is normal.  No ascites or pneumoperitoneum and no
pathologic distension of small bowel.  No definite pathologic
lymphadenopathy identified within the abdomen or pelvis.  There are
a few colonic diverticula, without surrounding inflammatory changes
to suggest an acute diverticulitis at this time.  Hypertrophy of
the median lobe of the prostate gland is noted.  Urinary bladder is
unremarkable in appearance.

Musculoskeletal: There are no aggressive appearing lytic or blastic
lesions noted in the visualized portions of the skeleton.
IMPRESSION: 1.  No acute abnormality in the abdomen or pelvis to account for
the patient's symptoms.  Specifically, the appendix is normal.
2.  Hepatic steatosis.
3.  Mild colonic diverticulosis without findings to suggest acute
diverticulitis.
4.  Additional incidental findings, as above.

## 2014-04-17 DIAGNOSIS — N401 Enlarged prostate with lower urinary tract symptoms: Secondary | ICD-10-CM | POA: Diagnosis not present

## 2014-04-17 DIAGNOSIS — N138 Other obstructive and reflux uropathy: Secondary | ICD-10-CM | POA: Diagnosis not present

## 2014-04-17 DIAGNOSIS — R339 Retention of urine, unspecified: Secondary | ICD-10-CM | POA: Diagnosis not present

## 2014-04-21 ENCOUNTER — Ambulatory Visit (INDEPENDENT_AMBULATORY_CARE_PROVIDER_SITE_OTHER): Payer: Medicare Other | Admitting: Interventional Cardiology

## 2014-04-21 ENCOUNTER — Encounter: Payer: Self-pay | Admitting: Cardiology

## 2014-04-21 ENCOUNTER — Encounter: Payer: Self-pay | Admitting: Interventional Cardiology

## 2014-04-21 VITALS — BP 122/72 | HR 64 | Ht 74.0 in | Wt 203.0 lb

## 2014-04-21 DIAGNOSIS — R9430 Abnormal result of cardiovascular function study, unspecified: Secondary | ICD-10-CM | POA: Diagnosis not present

## 2014-04-21 DIAGNOSIS — R0609 Other forms of dyspnea: Secondary | ICD-10-CM

## 2014-04-21 DIAGNOSIS — R0989 Other specified symptoms and signs involving the circulatory and respiratory systems: Secondary | ICD-10-CM

## 2014-04-21 DIAGNOSIS — R9431 Abnormal electrocardiogram [ECG] [EKG]: Secondary | ICD-10-CM | POA: Diagnosis not present

## 2014-04-21 DIAGNOSIS — E782 Mixed hyperlipidemia: Secondary | ICD-10-CM | POA: Diagnosis not present

## 2014-04-21 DIAGNOSIS — Z5181 Encounter for therapeutic drug level monitoring: Secondary | ICD-10-CM

## 2014-04-21 LAB — BASIC METABOLIC PANEL
BUN: 11 mg/dL (ref 6–23)
CALCIUM: 9.4 mg/dL (ref 8.4–10.5)
CHLORIDE: 103 meq/L (ref 96–112)
CO2: 22 mEq/L (ref 19–32)
CREATININE: 0.8 mg/dL (ref 0.4–1.5)
GFR: 109.45 mL/min (ref >60.00)
Glucose, Bld: 140 mg/dL — ABNORMAL HIGH (ref 70–99)
Potassium: 4.3 mEq/L (ref 3.5–5.1)
Sodium: 137 mEq/L (ref 135–145)

## 2014-04-21 LAB — PROTIME-INR
INR: 1.1 ratio — AB (ref 0.8–1.0)
PROTHROMBIN TIME: 11.8 s (ref 9.6–13.1)

## 2014-04-21 NOTE — Progress Notes (Signed)
Patient ID: Eduardo Watts, male   DOB: 1945/09/17, 68 y.o.   MRN: [id number]      Occasional palpitations:  If cath w/u negative, would plan on 30 day event monitor.  Sx occur once a week.    Juhi Lagrange S.

## 2014-04-21 NOTE — Patient Instructions (Signed)
Your physician recommends that you return for lab work today for bmet, cbc with diff and pt/inr.  Your physician has requested that you have a cardiac catheterization. Cardiac catheterization is used to diagnose and/or treat various heart conditions. Doctors may recommend this procedure for a number of different reasons. The most common reason is to evaluate chest pain. Chest pain can be a symptom of coronary artery disease (CAD), and cardiac catheterization can show whether plaque is narrowing or blocking your heart's arteries. This procedure is also used to evaluate the valves, as well as measure the blood flow and oxygen levels in different parts of your heart. For further information please visit www.cardiosmart.org. Please follow instruction sheet, as given.   

## 2014-04-21 NOTE — Progress Notes (Signed)
Patient ID: Eduardo Watts, male   DOB: Oct 22, 1945, 68 y.o.   MRN: [id number] Patient ID: Eduardo Watts, male   DOB: 1946-06-02, 68 y.o.   MRN: [id number]     Patient ID: Eduardo Watts MRN: [id number] DOB/AGE: 1945/12/18 68 y.o.   Referring Physician  Dr. Harrington Watts   Reason for Consultation Abnormal ECG  HPI: 52 y/o who had a routine ECG which was abnormal in 2014.  Eduardo Watts work is his most strenuous exercise.  He feels that he tires more quickly.  He attributes it to age.  He does not walk much.  He is very claustrophobic and scared of a machines that enclose him.  He did have a nuclear stress test and did not tolerate the camera about 12 years ago, so it was stopped.  He feels his heart pounding with anxiety.  He has some SHOB with working in the yard.  He was trimming some shrubs and could feel it.  His wife could feel it.  No leg swelling.  Mother had a pacer.  No stents or CABG in the family.   He had an echo in Jan 2015 that showed normal systolic funciton.  He continues to be [facility].  He has never felt better since seeing me, and in fact is 10% worse by his report.  He walks to the mailbox and has SHOB.  He has palpitations once a week, and sweats.  He admits to alcohol consumption and has been asked to decrease by his endocrinologist and internist.    Current Outpatient Prescriptions  Medication Sig Dispense Refill  . alfuzosin (UROXATRAL) 10 MG 24 hr tablet Take 10 mg by mouth daily.      . B Complex Vitamins (B COMPLEX PO) Take by mouth.      Marland Kitchen CALCIUM PO Take by mouth.      . carvedilol (COREG) 3.125 MG tablet Take 3.125 mg by mouth 2 (two) times daily with a meal.      . Cholecalciferol (VITAMIN D3) 2000 UNITS TABS Take by mouth.      . finasteride (PROSCAR) 5 MG tablet Take 5 mg by mouth daily.      . fluticasone (FLONASE) 50 MCG/ACT nasal spray Place into both nostrils daily.      . hydrOXYzine (ATARAX/VISTARIL) 10 MG tablet Take 10 mg by mouth 3 (three) times daily as needed.       Marland Kitchen KRILL OIL PO Take 500 mg by mouth.      Marland Kitchen LORazepam (ATIVAN) 1 MG tablet Take 1 mg by mouth at bedtime.      Marland Kitchen MAGNESIUM PO Take by mouth.      . Plant Sterols and Stanols (CHOLEST OFF PO) Take 450 mg by mouth.       No current facility-administered medications for this visit.   Past Medical History  Diagnosis Date  . Depression     sees Dr Eduardo Watts  . Sleep disturbances   . Hyperlipidemia   . History of BPH     Dr.Dahlstedt  . Asthma     as a child    Family History  Problem Relation Age of Onset  . Dementia Mother   . Cancer - Prostate Father   . Hyperlipidemia Sister     high cholestrol    History   Social History  . Marital Status: Unknown    Spouse Name: N/A    Number of Children: N/A  . Years of Education: N/A   Occupational History  . Not on  file.   Social History Main Topics  . Smoking status: Never Smoker   . Smokeless tobacco: Not on file  . Alcohol Use: 1.8 oz/week    3 Glasses of wine per week  . Drug Use: Not on file  . Sexual Activity: Not on file   Other Topics Concern  . Not on file   Social History Narrative   Never smoked   Alcohol yes -drinking on the weekends/2 glasses of wine with dinner   Caffeine- yes   Recreational drug use -No   Diet- fruits and vegetables,less fried food more pasta   Exercise- decrease to fatigue ,does yard work,and  walks occasionally    Occupation -employed Press photographer   Married 1 daughter/ 1 granddaughter   Goes by Eduardo Watts" loves animals    Past Surgical History  Procedure Laterality Date  . Tonsillectomy        (Not in a hospital admission)  Review of systems complete and found to be negative unless listed above .  No nausea, vomiting.  No fever chills, No focal weakness,  No palpitations.  Physical Exam: Filed Vitals:   04/21/14 0813  BP: 122/72  Pulse: 64    Weight: 203 lb (92.08 kg)  Physical exam:  /AT EOMI No JVD, No carotid bruit RRR S1S2  No wheezing Soft. NT, nondistended No  edema. No focal motor or sensory deficits Normal affect  Labs:   Lab Results  Component Value Date   WBC 6.3 07/02/2012   HGB 15.5 07/02/2012   HCT 44.8 07/02/2012   MCV 92.0 07/02/2012   PLT 292 07/02/2012   No results found for this basename: NA, K, CL, CO2, BUN, CREATININE, CALCIUM, LABALBU, PROT, BILITOT, ALKPHOS, ALT, AST, GLUCOSE,  in the last 168 hours No results found for this basename: CKTOTAL,  CKMB,  CKMBINDEX,  TROPONINI    No results found for this basename: CHOL   No results found for this basename: HDL   No results found for this basename: LDLCALC   No results found for this basename: TRIG   No results found for this basename: CHOLHDL   No results found for this basename: LDLDIRECT      Radiology: Abdominal CT in 11/13: Fatty liver EKG: Normal sinus rhythm, Q wave in lead 3. T-wave inversions anteriorly, diffuse T-wave flattening in other leads.  ASSESSMENT AND PLAN:  Shortness of breath:  Echocardiogram showed no systolic dysfunction in Jan 2015. He apparently does not have full blown hemochromatosis. He has never been treated with phlebotomy.  No evidence of fluid overload. Check BNP.  May be anginal equivalent. Symptoms have persisted.  Risks and benefits of cath discussed and he is agreeable to proceeding.  WOuld not believe negative stress result, so will plan for cath.  All questions answered.    Abnormal ECG: Nonspecific ST segment changes. T-wave flattening and T-wave inversion. Q wave in lead 3.  He is very concerned about the T wave inversions.   Abnormal echo:  Explained diastolic dysfunction at length.  Encouraged him to decrease alcohol intake as well.   Hyperlipidemia:  Continue Fenofibrate.  LDL 214. TG 227.  May need statin for LDL lowering. His liver function will be an issue.  Signed:  Mina Marble, MD, [facility] 04/21/2014, 8:41 AM

## 2014-04-22 LAB — CBC WITH DIFFERENTIAL/PLATELET
Basophils Absolute: 0 10*3/uL (ref 0.0–0.1)
Basophils Relative: 0.3 % (ref 0.0–3.0)
EOS ABS: 0.2 10*3/uL (ref 0.0–0.7)
EOS PCT: 2.7 % (ref 0.0–5.0)
HCT: 46 % (ref 39.0–52.0)
Hemoglobin: 15.5 g/dL (ref 13.0–17.0)
Lymphocytes Relative: 14.8 % (ref 12.0–46.0)
Lymphs Abs: 1.1 10*3/uL (ref 0.7–4.0)
MCHC: 33.7 g/dL (ref 30.0–36.0)
MCV: 100 fl (ref 78.0–100.0)
MONO ABS: 0.5 10*3/uL (ref 0.1–1.0)
Monocytes Relative: 6.9 % (ref 3.0–12.0)
NEUTROS PCT: 75.3 % (ref 43.0–77.0)
Neutro Abs: 5.7 10*3/uL (ref 1.4–7.7)
PLATELETS: 235 10*3/uL (ref 150.0–400.0)
RBC: 4.6 Mil/uL (ref 4.22–5.81)
RDW: 13.4 % (ref 11.5–15.5)
WBC: 7.6 10*3/uL (ref 4.0–10.5)

## 2014-04-29 ENCOUNTER — Other Ambulatory Visit: Payer: Self-pay | Admitting: Interventional Cardiology

## 2014-04-29 ENCOUNTER — Encounter (HOSPITAL_COMMUNITY): Payer: Self-pay | Admitting: Pharmacy Technician

## 2014-04-29 DIAGNOSIS — I209 Angina pectoris, unspecified: Secondary | ICD-10-CM

## 2014-04-30 ENCOUNTER — Encounter (HOSPITAL_COMMUNITY)
Admission: RE | Disposition: A | Discharge status: Home / Self Care | Payer: Self-pay | Source: Ambulatory Visit | Attending: Interventional Cardiology

## 2014-04-30 ENCOUNTER — Ambulatory Visit (HOSPITAL_COMMUNITY)
Admission: RE | Admit: 2014-04-30 | Discharge: 2014-04-30 | Disposition: A | Discharge status: Home / Self Care | Payer: Medicare Other | Source: Ambulatory Visit | Attending: Interventional Cardiology | Admitting: Interventional Cardiology

## 2014-04-30 DIAGNOSIS — R9431 Abnormal electrocardiogram [ECG] [EKG]: Secondary | ICD-10-CM | POA: Diagnosis not present

## 2014-04-30 DIAGNOSIS — R0989 Other specified symptoms and signs involving the circulatory and respiratory systems: Secondary | ICD-10-CM | POA: Diagnosis present

## 2014-04-30 DIAGNOSIS — R0609 Other forms of dyspnea: Secondary | ICD-10-CM | POA: Diagnosis not present

## 2014-04-30 DIAGNOSIS — I209 Angina pectoris, unspecified: Secondary | ICD-10-CM | POA: Diagnosis not present

## 2014-04-30 DIAGNOSIS — I2510 Atherosclerotic heart disease of native coronary artery without angina pectoris: Secondary | ICD-10-CM | POA: Insufficient documentation

## 2014-04-30 HISTORY — PX: LEFT HEART CATHETERIZATION WITH CORONARY ANGIOGRAM: SHX5451

## 2014-04-30 SURGERY — LEFT HEART CATHETERIZATION WITH CORONARY ANGIOGRAM
Anesthesia: LOCAL

## 2014-04-30 MED ORDER — OXYCODONE-ACETAMINOPHEN 5-325 MG PO TABS
ORAL_TABLET | ORAL | Status: AC
  Filled 2014-04-30: qty 1

## 2014-04-30 MED ORDER — HEPARIN SODIUM (PORCINE) 1000 UNIT/ML IJ SOLN
INTRAMUSCULAR | Status: AC
  Filled 2014-04-30: qty 1

## 2014-04-30 MED ORDER — DIAZEPAM 5 MG PO TABS
5.0000 mg | ORAL_TABLET | Freq: Once | ORAL | Status: AC
  Administered 2014-04-30: 5 mg via ORAL

## 2014-04-30 MED ORDER — OXYCODONE-ACETAMINOPHEN 5-325 MG PO TABS
1.0000 | ORAL_TABLET | Freq: Four times a day (QID) | ORAL | Status: DC | PRN
  Administered 2014-04-30: 1 via ORAL

## 2014-04-30 MED ORDER — ASPIRIN 81 MG PO CHEW: 81.0000 mg | CHEWABLE_TABLET | ORAL | Status: AC

## 2014-04-30 MED ORDER — HEPARIN (PORCINE) IN NACL 2-0.9 UNIT/ML-% IJ SOLN
INTRAMUSCULAR | Status: AC
  Filled 2014-04-30: qty 1000

## 2014-04-30 MED ORDER — FENTANYL CITRATE 0.05 MG/ML IJ SOLN
INTRAMUSCULAR | Status: AC
  Filled 2014-04-30: qty 2

## 2014-04-30 MED ORDER — LIDOCAINE HCL (PF) 1 % IJ SOLN
INTRAMUSCULAR | Status: AC
  Filled 2014-04-30: qty 30

## 2014-04-30 MED ORDER — MIDAZOLAM HCL 2 MG/2ML IJ SOLN
INTRAMUSCULAR | Status: AC
  Filled 2014-04-30: qty 2

## 2014-04-30 MED ORDER — VERAPAMIL HCL 2.5 MG/ML IV SOLN
INTRAVENOUS | Status: AC
  Filled 2014-04-30: qty 2

## 2014-04-30 MED ORDER — DIAZEPAM 5 MG PO TABS
ORAL_TABLET | ORAL | Status: AC
  Filled 2014-04-30: qty 1

## 2014-04-30 MED ORDER — SODIUM CHLORIDE 0.9 % IJ SOLN: 3.0000 mL | Freq: Two times a day (BID) | INTRAMUSCULAR | Status: DC

## 2014-04-30 MED ORDER — SODIUM CHLORIDE 0.9 % IV SOLN
INTRAVENOUS | Status: DC
  Administered 2014-04-30: 07:00:00 via INTRAVENOUS

## 2014-04-30 MED ORDER — NITROGLYCERIN 1 MG/10 ML FOR IR/CATH LAB
INTRA_ARTERIAL | Status: AC
  Filled 2014-04-30: qty 10

## 2014-04-30 MED ORDER — SODIUM CHLORIDE 0.9 % IV SOLN: 1.0000 mL/kg/h | INTRAVENOUS | Status: AC

## 2014-04-30 MED ORDER — SODIUM CHLORIDE 0.9 % IV SOLN: 250.0000 mL | INTRAVENOUS | Status: DC | PRN

## 2014-04-30 MED ORDER — SODIUM CHLORIDE 0.9 % IJ SOLN: 3.0000 mL | INTRAMUSCULAR | Status: DC | PRN

## 2014-04-30 NOTE — Interval H&P Note (Signed)
Cath Lab Visit (complete for each Cath Lab visit)  Clinical Evaluation Leading to the Procedure:   ACS: No.  Non-ACS:    Anginal Classification: CCS III  Anti-ischemic medical therapy: Minimal Therapy (1 class of medications)  Non-Invasive Test Results: No non-invasive testing performed  Prior CABG: No previous CABG      History and Physical Interval Note:  04/30/2014 7:42 AM  Eduardo Watts  has presented today for surgery, with the diagnosis of abnormal EKG  The various methods of treatment have been discussed with the patient and family. After consideration of risks, benefits and other options for treatment, the patient has consented to  Procedure(s): LEFT HEART CATHETERIZATION WITH CORONARY ANGIOGRAM (N/A) as a surgical intervention .  The patient's history has been reviewed, patient examined, no change in status, stable for surgery.  I have reviewed the patient's chart and labs.  Questions were answered to the patient's satisfaction.     VARANASI,JAYADEEP S.

## 2014-04-30 NOTE — CV Procedure (Signed)
       PROCEDURE:  Left heart catheterization with selective coronary angiography, left ventriculogram.  INDICATIONS:  DOE  The risks, benefits, and details of the procedure were explained to the patient.  The patient verbalized understanding and wanted to proceed.  Informed written consent was obtained.  PROCEDURE TECHNIQUE:  After Xylocaine anesthesia a 66F slender sheath was placed in the right radial artery with a single anterior needle wall stick.   Right coronary angiography was done using a Judkins R4 guide catheter.  Left coronary angiography was done using a Judkins L3.5 guide catheter.  Left ventriculography was done using a pigtail catheter.  A TR band was used for hemostasis.   CONTRAST:  Total of 90 cc.  COMPLICATIONS:  None.    HEMODYNAMICS:  Aortic pressure was 124/69; LV pressure was 124/3; LVEDP 9.  There was no gradient between the left ventricle and aorta.    ANGIOGRAPHIC DATA:   The left main coronary artery is widely patent.  The left anterior descending artery is a large vessel which wraps the apex. There is no significant atherosclerosis. There several medium size diagonal vessels which are widely patent.  The left circumflex artery is a large vessel. There several obtuse marginal branches which are widely patent. The circumflex system appears angiographically normal.  The right coronary artery is a large, dominant vessel. There is some catheter spasm in the proximal vessel with a mild stenosis. In the distal RCA, there is a moderate disease, 30-40%. The posterior lateral artery is large and widely patent. The posterior descending artery is medium-sized and patent.  LEFT VENTRICULOGRAM:  Left ventricular angiogram was done in the 30 RAO projection and revealed normal left ventricular wall motion and systolic function with an estimated ejection fraction of 50=55%.  LVEDP was 9 mmHg.  IMPRESSIONS:  1. Normal left main coronary artery. 2. Normal left anterior  descending artery and its branches. 3. Normal left circumflex artery and its branches. 4. Mild to moderate scattered plaque in the proximal and distal right coronary artery. 5. Normal left ventricular systolic function.  LVEDP 9 mmHg.  Ejection fraction 50-55%.  RECOMMENDATION:  Continue medical therapy. No evidence of fluid overload. Aggressive preventive therapy. We talked about diet control and increasing exercise to help improve his stamina.  Continue risk factor modification. He'll also need management of his iron overload disease. We also recommended decreasing alcohol intake.

## 2014-04-30 NOTE — Discharge Instructions (Signed)
Radial Site Care °Refer to this sheet in the next few weeks. These instructions provide you with information on caring for yourself after your procedure. Your caregiver may also give you more specific instructions. Your treatment has been planned according to current medical practices, but problems sometimes occur. Call your caregiver if you have any problems or questions after your procedure. °HOME CARE INSTRUCTIONS °· You may shower the day after the procedure. Remove the bandage (dressing) and gently wash the site with plain soap and water. Gently pat the site dry. °· Do not apply powder or lotion to the site. °· Do not submerge the affected site in water for 3 to 5 days. °· Inspect the site at least twice daily. °· Do not flex or bend the affected arm for 24 hours. °· No lifting over 5 pounds (2.3 kg) for 5 days after your procedure. °· Do not drive home if you are discharged the same day of the procedure. Have someone else drive you. °· You may drive 24 hours after the procedure unless otherwise instructed by your caregiver. °· Do not operate machinery or power tools for 24 hours. °· A responsible adult should be with you for the first 24 hours after you arrive home. °What to expect: °· Any bruising will usually fade within 1 to 2 weeks. °· Blood that collects in the tissue (hematoma) may be painful to the touch. It should usually decrease in size and tenderness within 1 to 2 weeks. °SEEK IMMEDIATE MEDICAL CARE IF: °· You have unusual pain at the radial site. °· You have redness, warmth, swelling, or pain at the radial site. °· You have drainage (other than a small amount of blood on the dressing). °· You have chills. °· You have a fever or persistent symptoms for more than 72 hours. °· You have a fever and your symptoms suddenly get worse. °· Your arm becomes pale, cool, tingly, or numb. °· You have heavy bleeding from the site. Hold pressure on the site. °Document Released: 08/26/2010 Document Revised:  10/16/2011 Document Reviewed: 08/26/2010 °ExitCare® Patient Information ©2015 [employer]. This information is not intended to replace advice given to you by your health care provider. Make sure you discuss any questions you have with your health care provider. ° °

## 2014-04-30 NOTE — H&P (View-Only) (Signed)
Patient ID: Eduardo Watts, male   DOB: 1946/03/09, 68 y.o.   MRN: [id number]      Occasional palpitations:  If cath w/u negative, would plan on 30 day event monitor.  Sx occur once a week.    Marylynn Rigdon S.

## 2014-05-01 ENCOUNTER — Telehealth: Payer: Self-pay | Admitting: Interventional Cardiology

## 2014-05-01 NOTE — Telephone Encounter (Signed)
Reviewed briefly cath findings with patient. He requested appt w/ Dr. Irish Lack for further discussion on findings and next step in plan of care. Appt. made for 9/30.

## 2014-05-01 NOTE — Telephone Encounter (Signed)
New message    Patient calling back regarding report of procedure on yesterday.

## 2014-05-06 ENCOUNTER — Encounter: Payer: Self-pay | Admitting: Interventional Cardiology

## 2014-05-06 ENCOUNTER — Ambulatory Visit (INDEPENDENT_AMBULATORY_CARE_PROVIDER_SITE_OTHER): Payer: Medicare Other | Admitting: Interventional Cardiology

## 2014-05-06 VITALS — BP 124/80 | HR 57 | Ht 74.0 in | Wt 199.1 lb

## 2014-05-06 DIAGNOSIS — I2510 Atherosclerotic heart disease of native coronary artery without angina pectoris: Secondary | ICD-10-CM | POA: Diagnosis not present

## 2014-05-06 DIAGNOSIS — I209 Angina pectoris, unspecified: Secondary | ICD-10-CM | POA: Diagnosis not present

## 2014-05-06 NOTE — Progress Notes (Signed)
Patient ID: Eduardo Watts, male   DOB: 01-Feb-1946, 68 y.o.   MRN: [id number]    [place], [place] [place], [place]  [address] Phone: [phone] Fax:  [phone]  Date:  05/06/2014   ID:  Eduardo Watts, DOB 07-14-1946, MRN [id number]  PCP:   Melinda Crutch, MD      History of Present Illness: Eduardo Watts is a 68 y.o. male who had a heart cath in 9/15. This showed moderate RCA disease. There was no significant obstructive disease. His ejection fraction was low normal. He comes today for followup.  He states he is given up alcohol. He has an iron overload syndrome as well. He has not started exercising. He is looking forward to doing this. He wants to come in for routine followup. We went over the results of his cardiac cath in detail.   Wt Readings from Last 3 Encounters:  05/06/14 199 lb 1.9 oz (90.32 kg)  04/30/14 203 lb (92.08 kg)  04/30/14 203 lb (92.08 kg)     Past Medical History  Diagnosis Date  . Depression     sees Dr [name]  . Sleep disturbances   . Hyperlipidemia   . History of BPH     Dr.Dahlstedt  . Asthma     as a child    Current Outpatient Prescriptions  Medication Sig Dispense Refill  . alfuzosin (UROXATRAL) 10 MG 24 hr tablet Take 10 mg by mouth daily.      Marland Kitchen aspirin 81 MG tablet Take 81 mg by mouth daily.      . B Complex Vitamins (B COMPLEX PO) Take 1 tablet by mouth daily.       Marland Kitchen CALCIUM PO Take 1 tablet by mouth daily.       . carvedilol (COREG) 3.125 MG tablet Take 3.125 mg by mouth 2 (two) times daily with a meal.      . Cholecalciferol (VITAMIN D3) 2000 UNITS TABS Take 2,000 Units by mouth daily.       . finasteride (PROSCAR) 5 MG tablet Take 5 mg by mouth daily.      . fluticasone (FLONASE) 50 MCG/ACT nasal spray Place 1 spray into both nostrils daily.       . hydrOXYzine (ATARAX/VISTARIL) 10 MG tablet Take 10 mg by mouth 3 (three) times daily as needed for anxiety.       Marland Kitchen KRILL OIL PO Take 500 mg by mouth daily.       Marland Kitchen  LORazepam (ATIVAN) 1 MG tablet Take 1 mg by mouth at bedtime.      Marland Kitchen MAGNESIUM PO Take 1 tablet by mouth daily.       . Plant Sterols and Stanols (CHOLEST OFF PO) Take 1 tablet by mouth daily.        No current facility-administered medications for this visit.    Allergies:    Allergies  Allergen Reactions  . Zocor [Simvastatin] Other (See Comments)    Muscle aches  . Trazodone And Nefazodone Other (See Comments)    "crazy"    Social History:  The patient  reports that he has never smoked. He does not have any smokeless tobacco history on file. He reports that he drinks about 1.8 ounces of alcohol per week.   Family History:  The patient's family history includes Cancer - Prostate in his father; Dementia in his mother; Hyperlipidemia in his sister.   ROS:  Please see the history of present illness.  No nausea, vomiting.  No fevers, chills.  No focal weakness.  No dysuria. No bleeding problem.   All other systems reviewed and negative.   PHYSICAL EXAM: VS:  BP 124/80  Pulse 57  Ht 6\' 2"  (1.88 m)  Wt 199 lb 1.9 oz (90.32 kg)  BMI 25.55 kg/m2 Well nourished, well developed, in no acute distress HEENT: normal Neck: no JVD, no carotid bruits Cardiac:  normal S1, S2; RRR;  Lungs:  clear to auscultation bilaterally, no wheezing, rhonchi or rales Abd: soft, nontender, no hepatomegaly Ext: no edema, 2+ right radial pulse Skin: warm and dry Neuro:   no focal abnormalities noted      ASSESSMENT AND PLAN:  1. CAD: Moderate.  Continue risk factor modification. He will try to exercise more and eat healthy. Hopefully, his stamina will improve with more exercise as well. He would like to followup in 6 months.  Signed, Mina Marble, MD, [facility] 05/06/2014 9:46 AM

## 2014-05-06 NOTE — Patient Instructions (Signed)
Your physician recommends that you continue on your current medications as directed. Please refer to the Current Medication list given to you today.  Your physician wants you to follow-up in: 6 months with Dr. Varanasi.  You will receive a reminder letter in the mail two months in advance. If you don't receive a letter, please call our office to schedule the follow-up appointment.  

## 2014-05-07 DIAGNOSIS — F329 Major depressive disorder, single episode, unspecified: Secondary | ICD-10-CM | POA: Diagnosis not present

## 2014-05-14 DIAGNOSIS — E119 Type 2 diabetes mellitus without complications: Secondary | ICD-10-CM | POA: Diagnosis not present

## 2014-05-14 DIAGNOSIS — H2513 Age-related nuclear cataract, bilateral: Secondary | ICD-10-CM | POA: Diagnosis not present

## 2014-06-09 ENCOUNTER — Ambulatory Visit (INDEPENDENT_AMBULATORY_CARE_PROVIDER_SITE_OTHER): Payer: Medicare Other | Admitting: Interventional Cardiology

## 2014-06-09 ENCOUNTER — Telehealth: Payer: Self-pay | Admitting: Interventional Cardiology

## 2014-06-09 ENCOUNTER — Encounter: Payer: Self-pay | Admitting: Interventional Cardiology

## 2014-06-09 VITALS — BP 126/80 | HR 75 | Ht 74.0 in | Wt 193.8 lb

## 2014-06-09 DIAGNOSIS — R9431 Abnormal electrocardiogram [ECG] [EKG]: Secondary | ICD-10-CM

## 2014-06-09 DIAGNOSIS — I2510 Atherosclerotic heart disease of native coronary artery without angina pectoris: Secondary | ICD-10-CM | POA: Diagnosis not present

## 2014-06-09 DIAGNOSIS — R05 Cough: Secondary | ICD-10-CM

## 2014-06-09 DIAGNOSIS — I209 Angina pectoris, unspecified: Secondary | ICD-10-CM | POA: Diagnosis not present

## 2014-06-09 DIAGNOSIS — R059 Cough, unspecified: Secondary | ICD-10-CM

## 2014-06-09 NOTE — Progress Notes (Signed)
Patient ID: Eduardo Watts., male   DOB: [birth year], 68 y.o.   MRN: [id number] .jvf Patient ID: Eduardo Watts., male   DOB: 1945/10/18, 68 y.o.   MRN: [id number]    [place], [place], [place]  [address] Phone: [phone] Fax:  [phone]  Date:  06/09/2014   ID:  Eduardo Watts., DOB [birth year], MRN [id number]  PCP:   Melinda Crutch, MD      History of Present Illness: [name]. is a 68 y.o. male who had a heart cath in 9/15. This showed moderate RCA disease. There was no significant obstructive disease. His ejection fraction was low normal. He comes today because of cough and " change of voice."  He has had some fluttering.  He is still concerned about the anterior T wave inversion.  Cough started a week after the cath. Fluttering noted there as well.    He states he is given up alcohol since the day of the cath. He has an iron overload syndrome as well. He has not started exercising. He is looking forward to doing this. He wants to come in for routine followup. We went over the results of his cardiac cath in detail.   Wt Readings from Last 3 Encounters:  06/09/14 193 lb 12.8 oz (87.907 kg)  05/06/14 199 lb 1.9 oz (90.32 kg)  04/30/14 203 lb (92.08 kg)     Past Medical History  Diagnosis Date  . Depression     sees Dr [name]  . Sleep disturbances   . Hyperlipidemia   . History of BPH     Dr.Dahlstedt  . Asthma     as a child    Current Outpatient Prescriptions  Medication Sig Dispense Refill  . alfuzosin (UROXATRAL) 10 MG 24 hr tablet Take 10 mg by mouth daily.    Marland Kitchen aspirin 81 MG tablet Take 81 mg by mouth daily.    . B Complex Vitamins (B COMPLEX PO) Take 1 tablet by mouth daily.     Marland Kitchen CALCIUM PO Take 1 tablet by mouth daily.     . carvedilol (COREG) 3.125 MG tablet Take 3.125 mg by mouth 2 (two) times daily with a meal.    . Cholecalciferol (VITAMIN D3) 2000 UNITS TABS Take 2,000 Units by mouth daily.     . finasteride  (PROSCAR) 5 MG tablet Take 5 mg by mouth daily.    . fluticasone (FLONASE) 50 MCG/ACT nasal spray Place 1 spray into both nostrils daily.     . hydrOXYzine (ATARAX/VISTARIL) 10 MG tablet Take 10 mg by mouth 3 (three) times daily as needed for anxiety.     Marland Kitchen KRILL OIL PO Take 500 mg by mouth daily.     Marland Kitchen LORazepam (ATIVAN) 1 MG tablet Take 1 mg by mouth at bedtime.    Marland Kitchen MAGNESIUM PO Take 1 tablet by mouth daily.     . Plant Sterols and Stanols (CHOLEST OFF PO) Take 1 tablet by mouth daily.      No current facility-administered medications for this visit.    Allergies:    Allergies  Allergen Reactions  . Zocor [Simvastatin] Other (See Comments)    Muscle aches  . Trazodone And Nefazodone Other (See Comments)    "crazy"    Social History:  The patient  reports that he has never smoked. He does not have any smokeless tobacco history on file. He reports that he drinks about 1.8 oz  of alcohol per week.   Family History:  The patient's family history includes Cancer - Prostate in his father; Dementia in his mother; Hyperlipidemia in his sister.   ROS:  Please see the history of present illness.  No nausea, vomiting.  No fevers, chills.  No focal weakness.  No dysuria. No bleeding problem.   All other systems reviewed and negative.   PHYSICAL EXAM: VS:  BP 126/80 mmHg  Pulse 75  Ht 6\' 2"  (1.88 m)  Wt 193 lb 12.8 oz (87.907 kg)  BMI 24.87 kg/m2  SpO2 95% Well nourished, well developed, in no acute distress HEENT: normal Neck: no JVD, no carotid bruits Cardiac:  normal S1, S2; RRR;  Lungs:  clear to auscultation bilaterally, no wheezing, rhonchi or rales Abd: soft, nontender, no hepatomegaly Ext: no edema, 2+ right radial pulse Skin: warm and dry Neuro:   no focal abnormalities noted, right sided facial twitch Psych: anxious      ASSESSMENT AND PLAN:  1. CAD: Moderate.  Continue risk factor modification. He will try to exercise more and eat healthy. Hopefully, his stamina will  improve with more exercise as well. Cough is not related to his CAD. 2. Cough: Sounds like he may have viral bronchitis. Not making much sputum. Will check chest x-ray to rule out pneumonia. Consider pulmonary function tests. He may benefit from an inhaler. He states that he had asthma as a child. He will be seeing Dr. Harrington Challenger in the next couple of months.  He is not taking an ACE inhibitor. 3. Abnormal ECG: He continues to be concerned about his T-wave inversion from prior ECG. I tried to reassure him that his may be normal for him. Also, his ejection fraction of 50-55% is within the normal range.   4. I congratulated him on abstaining from alcohol for over a month now. Signed, Mina Marble, MD, [facility] 06/09/2014 4:53 PM

## 2014-06-09 NOTE — Telephone Encounter (Signed)
Received call from operator from patient who states since the time of his cardiac catheterization he has noticed a substantial change in that his heart rate feels weaker and feels like it flutters; patient states prior to cath his heart would race and he would feel it in his throat.  Patient states he has a constant tickle in the throat and coughing that he has to subdue when he is talking.  Patient states he did have some of these symptoms when he saw Dr. Irish Lack on 9/30 but he did not mention because it was so soon after the cath and he thought they would go away.  I advised patient that Dr. Irish Lack has an opening this afternoon.  Patient scheduled to see him at 4:15 today.

## 2014-06-09 NOTE — Telephone Encounter (Signed)
New message      Pt had a cath 6wks ago.  He is having sob and heart flutter.  Please advise

## 2014-06-09 NOTE — Patient Instructions (Addendum)
A chest x-ray takes a picture of the organs and structures inside the chest, including the heart, lungs, and blood vessels. This test can show several things, including, whether the heart is enlarges; whether fluid is building up in the lungs; and whether pacemaker / defibrillator leads are still in place.

## 2014-06-10 ENCOUNTER — Ambulatory Visit
Admission: RE | Admit: 2014-06-10 | Discharge: 2014-06-10 | Disposition: A | Discharge status: Home / Self Care | Payer: Medicare Other | Source: Ambulatory Visit | Attending: Interventional Cardiology | Admitting: Interventional Cardiology

## 2014-06-10 DIAGNOSIS — R059 Cough, unspecified: Secondary | ICD-10-CM

## 2014-06-10 DIAGNOSIS — R0602 Shortness of breath: Secondary | ICD-10-CM | POA: Diagnosis not present

## 2014-06-10 DIAGNOSIS — R05 Cough: Secondary | ICD-10-CM | POA: Diagnosis not present

## 2014-06-10 DIAGNOSIS — R0989 Other specified symptoms and signs involving the circulatory and respiratory systems: Secondary | ICD-10-CM | POA: Diagnosis not present

## 2014-06-11 ENCOUNTER — Other Ambulatory Visit: Payer: Self-pay | Admitting: *Deleted

## 2014-06-11 DIAGNOSIS — R059 Cough, unspecified: Secondary | ICD-10-CM

## 2014-06-11 DIAGNOSIS — R05 Cough: Secondary | ICD-10-CM

## 2014-07-13 ENCOUNTER — Ambulatory Visit (INDEPENDENT_AMBULATORY_CARE_PROVIDER_SITE_OTHER): Payer: Medicare Other | Admitting: Internal Medicine

## 2014-07-13 DIAGNOSIS — R05 Cough: Secondary | ICD-10-CM | POA: Diagnosis not present

## 2014-07-13 DIAGNOSIS — R059 Cough, unspecified: Secondary | ICD-10-CM

## 2014-07-13 LAB — PULMONARY FUNCTION TEST
DL/VA % pred: 98 %
DL/VA: 4.65 ml/min/mmHg/L
DLCO UNC % PRED: 83 %
DLCO unc: 29.69 ml/min/mmHg
FEF 25-75 POST: 1.64 L/s
FEF 25-75 Pre: 1.31 L/sec
FEF2575-%Change-Post: 24 %
FEF2575-%PRED-POST: 60 %
FEF2575-%Pred-Pre: 48 %
FEV1-%Change-Post: 6 %
FEV1-%PRED-PRE: 68 %
FEV1-%Pred-Post: 73 %
FEV1-POST: 2.61 L
FEV1-Pre: 2.45 L
FEV1FVC-%CHANGE-POST: 1 %
FEV1FVC-%PRED-PRE: 84 %
FEV6-%Change-Post: 6 %
FEV6-%Pred-Post: 88 %
FEV6-%Pred-Pre: 83 %
FEV6-Post: 4.07 L
FEV6-Pre: 3.83 L
FEV6FVC-%CHANGE-POST: 0 %
FEV6FVC-%Pred-Post: 104 %
FEV6FVC-%Pred-Pre: 103 %
FVC-%CHANGE-POST: 5 %
FVC-%Pred-Post: 84 %
FVC-%Pred-Pre: 80 %
FVC-Post: 4.11 L
FVC-Pre: 3.91 L
PRE FEV1/FVC RATIO: 63 %
PRE FEV6/FVC RATIO: 98 %
Post FEV1/FVC ratio: 63 %
Post FEV6/FVC ratio: 99 %
RV % pred: 114 %
RV: 2.96 L
TLC % pred: 94 %
TLC: 7.08 L

## 2014-07-13 NOTE — Progress Notes (Signed)
PFT done today. 

## 2014-07-16 ENCOUNTER — Encounter (HOSPITAL_COMMUNITY): Payer: Self-pay | Admitting: Interventional Cardiology

## 2014-07-16 DIAGNOSIS — F329 Major depressive disorder, single episode, unspecified: Secondary | ICD-10-CM | POA: Diagnosis not present

## 2014-07-22 ENCOUNTER — Telehealth: Payer: Self-pay | Admitting: Interventional Cardiology

## 2014-07-22 DIAGNOSIS — R942 Abnormal results of pulmonary function studies: Secondary | ICD-10-CM

## 2014-07-22 NOTE — Telephone Encounter (Signed)
OK to refer to pulmonary per Dr. [name].

## 2014-07-23 NOTE — Telephone Encounter (Signed)
Called patient back about having a referral to pulmonary per Dr. [name]. Patient agreeable to plan. Will send to [facility] for scheduling.

## 2014-07-23 NOTE — Addendum Note (Signed)
Addended by: Ewell Poe L on: 07/23/2014 09:28 AM   Modules accepted: Orders

## 2014-08-13 DIAGNOSIS — R7989 Other specified abnormal findings of blood chemistry: Secondary | ICD-10-CM | POA: Diagnosis not present

## 2014-08-13 DIAGNOSIS — Z125 Encounter for screening for malignant neoplasm of prostate: Secondary | ICD-10-CM | POA: Diagnosis not present

## 2014-08-13 DIAGNOSIS — E782 Mixed hyperlipidemia: Secondary | ICD-10-CM | POA: Diagnosis not present

## 2014-08-13 DIAGNOSIS — E559 Vitamin D deficiency, unspecified: Secondary | ICD-10-CM | POA: Diagnosis not present

## 2014-08-13 DIAGNOSIS — E119 Type 2 diabetes mellitus without complications: Secondary | ICD-10-CM | POA: Diagnosis not present

## 2014-08-13 DIAGNOSIS — I10 Essential (primary) hypertension: Secondary | ICD-10-CM | POA: Diagnosis not present

## 2014-09-01 DIAGNOSIS — L812 Freckles: Secondary | ICD-10-CM | POA: Diagnosis not present

## 2014-09-01 DIAGNOSIS — D1801 Hemangioma of skin and subcutaneous tissue: Secondary | ICD-10-CM | POA: Diagnosis not present

## 2014-09-01 DIAGNOSIS — I788 Other diseases of capillaries: Secondary | ICD-10-CM | POA: Diagnosis not present

## 2014-09-01 DIAGNOSIS — L821 Other seborrheic keratosis: Secondary | ICD-10-CM | POA: Diagnosis not present

## 2014-09-01 DIAGNOSIS — L304 Erythema intertrigo: Secondary | ICD-10-CM | POA: Diagnosis not present

## 2014-09-07 ENCOUNTER — Ambulatory Visit (INDEPENDENT_AMBULATORY_CARE_PROVIDER_SITE_OTHER): Payer: Medicare Other | Admitting: Internal Medicine

## 2014-09-07 ENCOUNTER — Encounter: Payer: Self-pay | Admitting: Internal Medicine

## 2014-09-07 VITALS — BP 96/60 | HR 87 | Ht 74.0 in | Wt 198.0 lb

## 2014-09-07 DIAGNOSIS — R059 Cough, unspecified: Secondary | ICD-10-CM

## 2014-09-07 DIAGNOSIS — R05 Cough: Secondary | ICD-10-CM | POA: Diagnosis not present

## 2014-09-07 DIAGNOSIS — I952 Hypotension due to drugs: Secondary | ICD-10-CM

## 2014-09-07 MED ORDER — FLUTICASONE PROPIONATE HFA 110 MCG/ACT IN AERO: 2.0000 | INHALATION_SPRAY | Freq: Two times a day (BID) | RESPIRATORY_TRACT | Status: DC

## 2014-09-07 NOTE — Progress Notes (Signed)
09/07/14- 69 yoM referred courtesy of Dr Irish Lack after PFT, for pulmonary consultation FOLLOWS FOR: Pt had a heart cath 04/30/14 and has since noticed a cough following procedure. He had a abnormal PFT. PFT 07/13/14- minimal obstructive airways disease, insignificant response to bronchodilator, normal lung volumes and diffusion. Dry cough started around the time of his heart catheterization in September and is triggered especially by speech and especially in the morning, happening 2 or 3 times a day. Some shortness of breath and easy fatigue. Sometimes notices some wheeze or "gurgling". Denies history of pneumonia, taking Flonase for allergic rhinitis. 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 CXR 06/10/14 FINDINGS: Cardiomediastinal silhouette is within normal limits. Thoracic aortic calcification is noted. The lungs are normally to mildly hyperinflated. Slightly increased density in the medial right lung base appears to represent osseous overgrowth of the costovertebral articulation on prior abdominal CT. Lungs are otherwise clear. No pleural effusion or pneumothorax is identified. Old right seventh and eighth rib fractures are noted. IMPRESSION: No acute abnormality identified. Electronically Signed  By: [name]  On: 06/10/2014 09:45  Prior to Admission medications   Medication Sig Start Date End Date Taking? Authorizing Provider  alfuzosin (UROXATRAL) 10 MG 24 hr tablet Take 10 mg by mouth daily.   Yes Historical Provider, MD  aspirin 81 MG tablet Take 81 mg by mouth daily.   Yes Historical Provider, MD  B Complex Vitamins (B COMPLEX PO) Take 1 tablet by mouth daily.    Yes Historical Provider, MD  CALCIUM PO Take 1 tablet by mouth daily.    Yes Historical Provider, MD  carvedilol (COREG) 3.125 MG tablet Take 3.125 mg by mouth daily.    Yes Historical Provider, MD   Cholecalciferol (VITAMIN D3) 2000 UNITS TABS Take 2,000 Units by mouth daily.    Yes Historical Provider, MD  FENOFIBRATE PO Take 160 mg by mouth daily.   Yes Historical Provider, MD  finasteride (PROSCAR) 5 MG tablet Take 5 mg by mouth daily.   Yes Historical Provider, MD  fluticasone (FLONASE) 50 MCG/ACT nasal spray Place 1 spray into both nostrils daily.    Yes Historical Provider, MD  hydrOXYzine (ATARAX/VISTARIL) 10 MG tablet Take 10 mg by mouth 3 (three) times daily as needed for anxiety.    Yes Historical Provider, MD  KRILL OIL PO Take 500 mg by mouth daily.    Yes Historical Provider, MD  LORazepam (ATIVAN) 1 MG tablet Take 1 mg by mouth at bedtime.   Yes Historical Provider, MD  MAGNESIUM PO Take 1 tablet by mouth daily.    Yes Historical Provider, MD  Plant Sterols and Stanols (CHOLEST OFF PO) Take 1 tablet by mouth daily.    Yes Historical Provider, MD  fluticasone (FLOVENT HFA) 110 MCG/ACT inhaler Inhale 2 puffs into the lungs 2 (two) times daily. 09/07/14   Deneise Lever, MD   Past Medical History  Diagnosis Date  . Depression     sees Dr [name]  . Sleep disturbances   . Hyperlipidemia   . History of BPH     Dr.Dahlstedt  . Asthma     as a child   Past Surgical History  Procedure Laterality Date  . Tonsillectomy    . Left heart catheterization with coronary angiogram N/A 04/30/2014    Procedure: LEFT HEART CATHETERIZATION WITH CORONARY ANGIOGRAM;  Surgeon: [name], MD;  Location: [facility] C[facility] LAB;  Service: Cardiovascular;  Laterality: N/A;   Family History  Problem Relation Age of Onset  . Dementia Mother   . Cancer - Prostate Father   . Hyperlipidemia Sister     high cholestrol   History   Social History  . Marital Status: Married    Spouse Name: N/A    Number of Children: N/A  . Years of Education: N/A   Occupational History  . Not on file.   Social History Main Topics  . Smoking status: Never Smoker   . Smokeless tobacco: Never Used  .  Alcohol Use: No     Comment: pt has not had any wine since 04/2014  . Drug Use: No  . Sexual Activity: Not on file   Other Topics Concern  . Not on file   Social History Narrative   Never smoked   Alcohol yes -drinking on the weekends/2 glasses of wine with dinner   Caffeine- yes   Recreational drug use -No   Diet- fruits and vegetables,less fried food more pasta   Exercise- decrease to fatigue ,does yard work,and  walks occasionally    Occupation -employed Press photographer   Married 1 daughter/ 1 granddaughter   Goes by [employer]" loves animals   ROS-see HPI Constitutional:   No-   weight loss, night sweats, fevers, chills, fatigue, lassitude. HEENT:   No-  headaches, difficulty swallowing, tooth/dental problems, sore throat,       No-  sneezing, itching, ear ache, nasal congestion, post nasal drip,  CV:  No-   chest pain, orthopnea, PND, swelling in lower extremities, anasarca,                                  dizziness, +palpitations Resp: +shortness of breath with exertion or at rest.              No-   productive cough,  No non-productive cough,  No- coughing up of blood.              No-   change in color of mucus.  No- wheezing.   Skin: No-   rash or lesions. GI:  No-   heartburn, indigestion, abdominal pain, nausea, vomiting, diarrhea,                 change in bowel habits, loss of appetite GU: No-   dysuria, change in color of urine, no urgency or frequency.  No- flank pain. MS:  No-   joint pain or swelling.  No- decreased range of motion.  No- back pain. Neuro-     nothing unusual Psych:  No- change in mood or affect. + depression or anxiety.  No memory loss.  OBJ- Physical Exam   BP 96/60 General- Alert, Oriented, Affect-appropriate, Distress- none acute Skin- rash-none, lesions- none, excoriation- none Lymphadenopathy- none Head- atraumatic            Eyes- Gross vision intact, PERRLA, conjunctivae and secretions clear            Ears- Hearing, canals-normal            Nose-  Clear, no-Septal dev, mucus, polyps, erosion, perforation             Throat- Mallampati II/+ palatoplasty , mucosa clear , drainage- none, tonsils-                   atrophic Neck- flexible , trachea midline, no  stridor , thyroid nl, carotid no bruit Chest - symmetrical excursion , unlabored           Heart/CV- RRR , no murmur , no gallop  , no rub, nl s1 s2                           - JVD- none , edema- none, stasis changes- none, varices- none           Lung- clear to P&A, wheeze- none, cough- none , dullness-none, rub- none           Chest wall-  Abd- tender-no, distended-no, bowel sounds-present, HSM- no Br/ Gen/ Rectal- Not done, not indicated Extrem- cyanosis- none, clubbing, none, atrophy- none, strength- nl Neuro- + right lower facial weakness

## 2014-09-07 NOTE — Patient Instructions (Signed)
Sample Flovent 110 inhaler    2 puffs, then rinse mouth, twice daily  See if this reduces your cough  You will want to discuss the Coreg, which might possibly be related to cough. Your BP is lower than you may need, and it might be possible to come off the Coreg.

## 2014-09-08 DIAGNOSIS — I959 Hypotension, unspecified: Secondary | ICD-10-CM | POA: Insufficient documentation

## 2014-09-08 NOTE — Assessment & Plan Note (Signed)
When pressed, he admits that cough might have begun before his heart catheterization, but not by much and not as bothersome as it  has become. We talked about reflux and medications as possible triggers. Plan-with blood pressure this low, he will ask his cardiologist if he can come off of Coreg. This medication might contribute to his cough as described Plan-stop Coreg if okay with cardiology, start Flovent HFA 110

## 2014-09-08 NOTE — Assessment & Plan Note (Signed)
Blood pressure now is low enough that he might be able to come off of Coreg. He was be interesting if cough improved with this change. He will get permission to stop this drug from his cardiologist.

## 2014-09-10 ENCOUNTER — Telehealth: Payer: Self-pay | Admitting: Interventional Cardiology

## 2014-09-10 NOTE — Telephone Encounter (Signed)
Message received from Dr. Irish Lack that he had reviewed Dr. [name] pulmonary note on the patient. He is having trouble with a cough that is thought might be related to coreg. Ok per Dr. Irish Lack to go ahead and have the patient stop coreg. The patient is only taking 3.125 mg once daily on his coreg. He is aware to go head and stop this and see how his cough does over the next several days. He voices understanding.

## 2014-10-07 DIAGNOSIS — N4889 Other specified disorders of penis: Secondary | ICD-10-CM | POA: Diagnosis not present

## 2014-10-07 DIAGNOSIS — R338 Other retention of urine: Secondary | ICD-10-CM | POA: Diagnosis not present

## 2014-10-07 DIAGNOSIS — N401 Enlarged prostate with lower urinary tract symptoms: Secondary | ICD-10-CM | POA: Diagnosis not present

## 2014-10-21 ENCOUNTER — Ambulatory Visit (INDEPENDENT_AMBULATORY_CARE_PROVIDER_SITE_OTHER): Payer: Medicare Other | Admitting: Interventional Cardiology

## 2014-10-21 ENCOUNTER — Encounter: Payer: Self-pay | Admitting: Interventional Cardiology

## 2014-10-21 VITALS — BP 124/76 | HR 73 | Ht 74.0 in | Wt 198.0 lb

## 2014-10-21 DIAGNOSIS — R05 Cough: Secondary | ICD-10-CM

## 2014-10-21 DIAGNOSIS — R9431 Abnormal electrocardiogram [ECG] [EKG]: Secondary | ICD-10-CM

## 2014-10-21 DIAGNOSIS — R002 Palpitations: Secondary | ICD-10-CM

## 2014-10-21 DIAGNOSIS — R059 Cough, unspecified: Secondary | ICD-10-CM

## 2014-10-21 NOTE — Progress Notes (Signed)
Patient ID: Eduardo Sox., male   DOB: 19-Jun-1946, 69 y.o.   MRN: [id number]    Eduardo, [place], [place]  [address] Phone: [phone] Fax:  [phone]  Date:  10/21/2014   ID:  Eduardo Sox., DOB [birth year], MRN [id number]  PCP:   Melinda Crutch, MD      History of Present Illness: Eduardo Watts. is a 69 y.o. male who had a heart cath in 9/15. This showed moderate RCA disease. There was no significant obstructive disease. His ejection fraction was low normal. He had a pulmonary eval for cough.  This is improved with less Coreg.  He is concerned about AFib and stroke isk.  He feels that he has daily palpitation.  He hits his chest.    He states he is given up alcohol since the day of the cath. He has an iron overload syndrome as well. He has not started exercising regularly. He mows the yard.    Wt Readings from Last 3 Encounters:  10/21/14 198 lb (89.812 kg)  09/07/14 198 lb (89.812 kg)  06/09/14 193 lb 12.8 oz (87.907 kg)     Past Medical History  Diagnosis Date  . Depression     sees Dr [name]  . Sleep disturbances   . Hyperlipidemia   . History of BPH     Dr.Dahlstedt  . Asthma     as a child    Current Outpatient Prescriptions  Medication Sig Dispense Refill  . alfuzosin (UROXATRAL) 10 MG 24 hr tablet Take 10 mg by mouth daily.    Marland Kitchen aspirin 81 MG tablet Take 81 mg by mouth daily.    . B Complex Vitamins (B COMPLEX PO) Take 1 tablet by mouth daily.     Marland Kitchen CALCIUM PO Take 1 tablet by mouth daily.     . carvedilol (COREG) 3.125 MG tablet Take 3.125 mg by mouth daily.     . Cholecalciferol (VITAMIN D3) 2000 UNITS TABS Take 2,000 Units by mouth daily.     . FENOFIBRATE PO Take 160 mg by mouth daily.    . finasteride (PROSCAR) 5 MG tablet Take 5 mg by mouth daily.    . fluticasone (FLONASE) 50 MCG/ACT nasal spray Place 1 spray into both nostrils daily.     . fluticasone (FLOVENT HFA) 110 MCG/ACT inhaler Inhale 2 puffs into the lungs  2 (two) times daily. 1 Inhaler 12  . hydrOXYzine (ATARAX/VISTARIL) 10 MG tablet Take 10 mg by mouth 3 (three) times daily as needed for anxiety.     Marland Kitchen KRILL OIL PO Take 500 mg by mouth daily.     Marland Kitchen LORazepam (ATIVAN) 1 MG tablet Take 1 mg by mouth at bedtime.    Marland Kitchen MAGNESIUM PO Take 1 tablet by mouth daily.     . Plant Sterols and Stanols (CHOLEST OFF PO) Take 1 tablet by mouth daily.      No current facility-administered medications for this visit.    Allergies:    Allergies  Allergen Reactions  . Zocor [Simvastatin] Other (See Comments)    Muscle aches  . Trazodone And Nefazodone Other (See Comments)    "crazy"    Social History:  The patient  reports that he has never smoked. He has never used smokeless tobacco. He reports that he does not drink alcohol or use illicit drugs.   Family History:  The patient's family history includes Cancer - Prostate in his father; Dementia  in his mother; Hyperlipidemia in his sister.   ROS:  Please see the history of present illness.  No nausea, vomiting.  No fevers, chills.  No focal weakness.  No dysuria. No bleeding problem.   All other systems reviewed and negative.   PHYSICAL EXAM: VS:  BP 124/76 mmHg  Pulse 73  Ht 6\' 2"  (1.88 m)  Wt 198 lb (89.812 kg)  BMI 25.41 kg/m2 Well nourished, well developed, in no acute distress HEENT: normal Neck: no JVD, no carotid bruits Cardiac:  normal S1, S2; RRR;  Lungs:  clear to auscultation bilaterally, no wheezing, rhonchi or rales Abd: soft, nontender, no hepatomegaly Ext: no edema, 2+ right radial pulse Skin: warm and dry Neuro:   no focal abnormalities noted, right sided facial twitch Psych: anxious      ASSESSMENT AND PLAN:  1. CAD: Moderate.  Continue risk factor modification. He will try to exercise more and eat healthy. Hopefully, his stamina will improve with more exercise as well. Cough is not related to his CAD.  Better with decreased Coreg.  We'll not plan to go above 3.125 mg twice a  day. 2. Cough:  Evaluated by pulmonary.  He did not respond well to a cortisone inhaler. 3. Abnormal ECG: He continues to be concerned about his T-wave inversion from prior ECG. I tried to reassure him that his may be normal for him. Also, his ejection fraction of 50-55% is within the normal range.   4. I congratulated him on abstaining from alcohol for  several  months now.  This will be good for both his heart and his liver. 5.  Palpitations: He is having frequent palpitations. It is difficult to know whether these are PACs, PVCs or something more sustained. He is concerned about atrial fibrillation given his family history of stroke. We'll plan for 30 day event monitor.  Signed, Mina Marble, MD, [facility] 10/21/2014 10:17 AM

## 2014-10-21 NOTE — Patient Instructions (Addendum)
Your physician recommends that you continue on your current medications as directed. Please refer to the Current Medication list given to you today.  Your physician has recommended that you wear an event monitor. Event monitors are medical devices that record the heart's electrical activity. Doctors most often [place] these monitors to diagnose arrhythmias. Arrhythmias are problems with the speed or rhythm of the heartbeat. The monitor is a small, portable device. You can wear one while you do your normal daily activities. This is usually used to diagnose what is causing palpitations/syncope (passing out).  Your physician recommends that you schedule a follow-up appointment in: 3 months with Dr. Irish Lack.

## 2014-10-23 DIAGNOSIS — F329 Major depressive disorder, single episode, unspecified: Secondary | ICD-10-CM | POA: Diagnosis not present

## 2014-10-26 ENCOUNTER — Encounter (INDEPENDENT_AMBULATORY_CARE_PROVIDER_SITE_OTHER): Payer: Medicare Other

## 2014-10-26 ENCOUNTER — Encounter: Payer: Self-pay | Admitting: *Deleted

## 2014-10-26 DIAGNOSIS — R002 Palpitations: Secondary | ICD-10-CM

## 2014-10-26 NOTE — Progress Notes (Signed)
Patient ID: Eduardo Watts., male   DOB: 1945-11-13, 69 y.o.   MRN: [id number] [employer] 30 day cardiac event monitor applied to patient.

## 2014-11-06 ENCOUNTER — Encounter: Payer: Self-pay | Admitting: Internal Medicine

## 2014-11-06 ENCOUNTER — Ambulatory Visit (INDEPENDENT_AMBULATORY_CARE_PROVIDER_SITE_OTHER): Payer: Medicare Other | Admitting: Internal Medicine

## 2014-11-06 ENCOUNTER — Encounter (INDEPENDENT_AMBULATORY_CARE_PROVIDER_SITE_OTHER): Payer: Self-pay

## 2014-11-06 VITALS — BP 118/70 | HR 82 | Ht 74.0 in | Wt 202.6 lb

## 2014-11-06 DIAGNOSIS — R059 Cough, unspecified: Secondary | ICD-10-CM

## 2014-11-06 DIAGNOSIS — R0600 Dyspnea, unspecified: Secondary | ICD-10-CM

## 2014-11-06 DIAGNOSIS — R05 Cough: Secondary | ICD-10-CM | POA: Diagnosis not present

## 2014-11-06 DIAGNOSIS — R0789 Other chest pain: Secondary | ICD-10-CM | POA: Diagnosis not present

## 2014-11-06 MED ORDER — TIOTROPIUM BROMIDE MONOHYDRATE 2.5 MCG/ACT IN AERS: 2.0000 | INHALATION_SPRAY | Freq: Every day | RESPIRATORY_TRACT | Status: DC

## 2014-11-06 NOTE — Progress Notes (Signed)
09/07/14- 13 yoM referred courtesy of Dr Irish Lack after PFT, for pulmonary consultation FOLLOWS FOR: Pt had a heart cath 04/30/14 and has since noticed a cough following procedure. He had a abnormal PFT. PFT 07/13/14- minimal obstructive airways disease, insignificant response to bronchodilator, normal lung volumes and diffusion. Dry cough started around the time of his heart catheterization in September and is triggered especially by speech and especially in the morning, happening 2 or 3 times a day. Some shortness of breath and easy fatigue. Sometimes notices some wheeze or "gurgling". Denies history of pneumonia, taking Flonase for allergic rhinitis. 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 CXR 06/10/14 FINDINGS: Cardiomediastinal silhouette is within normal limits. Thoracic aortic calcification is noted. The lungs are normally to mildly hyperinflated. Slightly increased density in the medial right lung base appears to represent osseous overgrowth of the costovertebral articulation on prior abdominal CT. Lungs are otherwise clear. No pleural effusion or pneumothorax is identified. Old right seventh and eighth rib fractures are noted. CXR 06/10/14 IMPRESSION: No acute abnormality identified. Electronically Signed  By: [name]  On: 06/10/2014 09:45  [birth year]- 69 YO M never smoker followed for cough, mild obstructive disease complicated by CAD, palpitations. We had suggested he come off Coreg- cardiology has him on reduced dose with less cough. We added Flovent 110. FOLLOWS FOR: Pt states cough as improved and his wearing a heart monitor for 30 days. He states he has SOB that increases with activity. Pt states right lung pain. Denies any n/v/d or f/c/s. Denies cough. Discomfort right parasternal region 3 years, not tender. Flovent irritated throat. We reviewed chest x-ray showing old  right seventh, eighth rib fractures and he remembers childhood trauma.  ROS-see HPI Constitutional:   No-   weight loss, night sweats, fevers, chills, fatigue, lassitude. HEENT:   No-  headaches, difficulty swallowing, tooth/dental problems, sore throat,       No-  sneezing, itching, ear ache, nasal congestion, post nasal drip,  CV:  No-   chest pain, orthopnea, PND, swelling in lower extremities, anasarca,                                  dizziness, +palpitations Resp: +shortness of breath with exertion or at rest.              No-   productive cough, + non-productive cough,  No- coughing up of blood.              No-   change in color of mucus.  No- wheezing.   Skin: No-   rash or lesions. GI:  No-   heartburn, indigestion, abdominal pain, nausea, vomiting,  GU: . MS:  No-   joint pain or swelling.   Neuro-     nothing unusual Psych:  No- change in mood or affect. + depression or anxiety.  No memory loss.  OBJ- Physical Exam   BP 96/60 General- Alert, Oriented, Affect-appropriate, Distress- none acute Skin- rash-none, lesions- none, excoriation- none Lymphadenopathy- none Head- atraumatic            Eyes- Gross vision intact, PERRLA, conjunctivae and secretions clear            Ears- Hearing, canals-normal            Nose- Clear, no-Septal dev, mucus, polyps, erosion, perforation  Throat- Mallampati II/+ uvulopalatoplasty , mucosa clear , drainage- none, tonsils-  atrophic, + throat clearing Neck- flexible , trachea midline, no stridor , thyroid nl, carotid no bruit Chest - symmetrical excursion , unlabored           Heart/CV- RRR , no murmur , no gallop  , no rub, nl s1 s2                           - JVD- none , edema- none, stasis changes- none, varices- none           Lung- clear to P&A, wheeze- none, cough- none , dullness-none, rub- none           Chest wall-+ heart monitor; + asymmetry chest wall at sternum consistent with old rib fractures and rib cage  distortion. Abd-  Br/ Gen/ Rectal- Not done, not indicated Extrem- cyanosis- none, clubbing, none, atrophy- none, strength- nl Neuro- + right lower facial weakness

## 2014-11-06 NOTE — Patient Instructions (Signed)
Order- walk test on room air  Dx dyspnea with exertion  Sample Spiriva Respimat   2 puffs, once daily  Order- ONOX on room air   Dx Dyspnea

## 2014-11-11 ENCOUNTER — Telehealth: Payer: Self-pay | Admitting: Internal Medicine

## 2014-11-11 DIAGNOSIS — J42 Unspecified chronic bronchitis: Secondary | ICD-10-CM

## 2014-11-11 DIAGNOSIS — R0600 Dyspnea, unspecified: Secondary | ICD-10-CM | POA: Diagnosis not present

## 2014-11-11 NOTE — Telephone Encounter (Signed)
Spoke with Eduardo Watts States that the patient's ONO qualified him for O2 Pt O2 sats were below 89% x 92 minutes and the lowest O2 sat recorded was 84% Please advise if you want nocturnal O2 started. Thanks  Cy-pls advise.

## 2014-11-12 NOTE — Telephone Encounter (Signed)
Order [facility] [place] O2 for sleep 2L for dx chronic bronchitis, based on ONOX 11/11/14 Please let patient know I will review this with him at next ov, but we wanted to go ahead because it is better for his heart not to let oxygen drop so much in sleep.

## 2014-11-12 NOTE — Telephone Encounter (Signed)
LM with Leafy Ro letting her know this order was placed. Nothing further was needed.

## 2014-11-26 ENCOUNTER — Telehealth: Payer: Self-pay | Admitting: Internal Medicine

## 2014-11-26 DIAGNOSIS — R0789 Other chest pain: Secondary | ICD-10-CM | POA: Insufficient documentation

## 2014-11-26 NOTE — Telephone Encounter (Addendum)
Mandy from [facility] called and said that patient is refusing order for O2 until he hears from our office. Message was routed back to triage and triage placed order unaware that patient was not given results.   Results are as follows:  States that the patient's ONO qualified him for O2 Pt O2 sats were below 89% x 92 minutes and the lowest O2 sat recorded was 84% Please advise if you want nocturnal O2 started. Thanks   Called and left message for patient to call back.  Will give results to patient, then call Mandy to confirm order so patient can start O2. Needs to begin O2 before ONO results are 48 days old or patient will need to do another test.

## 2014-11-26 NOTE — Assessment & Plan Note (Signed)
Musculoskeletal chest wall pain

## 2014-11-26 NOTE — Assessment & Plan Note (Addendum)
Mild obstructive airways disease demonstrated on PFT. We can try sample Spiriva for trial. His throat clearing suggests reflux or postnasal drainage. Reducing Coreg dose seemed to help cough. Room air oximetry 91% at rest raising question of oxygenation reserve. Plan-sample Spiriva, reflux precautions, 6 minute walk test, overnight oximetry

## 2014-11-30 NOTE — Telephone Encounter (Signed)
LM for pt to return call x 1

## 2014-12-01 NOTE — Telephone Encounter (Signed)
lmtcb for pt.  

## 2014-12-02 NOTE — Telephone Encounter (Signed)
Attempted to call pt. Line rang several times then the line went dead. Will try back.

## 2014-12-03 NOTE — Telephone Encounter (Signed)
LMTCB x 3 (home #) LMTCB x 1 (cell #)  Deneise Lever, MD at 11/12/2014 1:09 PM     Status: Signed       Expand All Collapse All   Order DME [place] O2 for sleep 2L for dx chronic bronchitis, based on ONOX 11/11/14 Please let patient know I will review this with him at next ov, but we wanted to go ahead because it is better for his heart not to let oxygen drop so much in sleep.             Virl Cagey, CMA at 11/11/2014 4:48 PM     Status: Signed       Expand All Collapse All   Spoke with Eduardo Watts States that the patient's ONO qualified him for O2 Pt O2 sats were below 89% x 92 minutes and the lowest O2 sat recorded was 84% Please advise if you want nocturnal O2 started. Thanks  Cy-pls advise.

## 2014-12-04 NOTE — Telephone Encounter (Signed)
lmtcb

## 2014-12-07 ENCOUNTER — Telehealth: Payer: Self-pay | Admitting: Interventional Cardiology

## 2014-12-07 ENCOUNTER — Telehealth: Payer: Self-pay | Admitting: Internal Medicine

## 2014-12-07 NOTE — Telephone Encounter (Signed)
°  1. Is this related to a heart monitor you are wearing?  About one he wore already  2. What is your issue?? Pt calling wanting results. Please call back and advise.

## 2014-12-07 NOTE — Telephone Encounter (Signed)
Called spoke with pt. He refuses to be set up on O2 until he speaks with his cardiologists Dr. Irish Lack. He wants to see what results he has and go from there. FYI for Dr. [name].

## 2014-12-07 NOTE — Telephone Encounter (Signed)
Left message for patient to call back. Looks like results for monitor are still in process phase.

## 2014-12-11 NOTE — Telephone Encounter (Signed)
Final result of monitor will be given to Dr Irish Lack next week.  The monitor team will deliver monitor on Monday.

## 2014-12-11 NOTE — Telephone Encounter (Signed)
I spoke with the pt and made him aware that his monitor has not been reviewed at this time. I made the pt aware that he should receive a call next week with results after the MD has reviewed.

## 2014-12-14 ENCOUNTER — Telehealth: Payer: Self-pay | Admitting: Interventional Cardiology

## 2014-12-14 NOTE — Telephone Encounter (Signed)
Pt calling to find out monitor results. Informed pt that Dr. Irish Lack would not be in the office until this afternoon.  Pt states that he called [facility] office 2 weeks ago in regards to monitor and they stated that they would email him the results and he never received these results. Informed pt that I would call today with results if available or would call tomorrow with an update. Pt verbalized understanding and was in agreement with this plan.

## 2014-12-15 NOTE — Telephone Encounter (Signed)
Left message for pt to call back. Received monitor results from Dr. Irish Lack, they are as follows: No Afib. PACs and PVCs correlate to palpitations.

## 2014-12-17 NOTE — Telephone Encounter (Signed)
Left message to call back  

## 2014-12-18 NOTE — Telephone Encounter (Signed)
Informed pt of monitor results. While speaking with pt he states that he had a sleep study done and it was noted that pt's O2 dropped to 88% during sleep. Pt states that Dr. [name], Pulmonologist, wants pt to wear O2 at night. Pt states that he is not ready to do this unless Dr. Irish Lack feels that it is absolutely necessary.  Informed pt that I would route this information to Dr. Irish Lack for review and advisement. Pt verbalized understanding and was in agreement with this plan.

## 2014-12-18 NOTE — Telephone Encounter (Signed)
I think the night time oxygen would be beneficial.  Longterm lack of oxygen at night can cause pulmonary hypertension and AFib as well as stretching of the right ventricle.  Better to try to prevent all of this.

## 2014-12-21 NOTE — Telephone Encounter (Signed)
Left message to call back  

## 2014-12-22 NOTE — Telephone Encounter (Signed)
Spoke with pt and informed him of Dr. Hassell Done recommendations. Pt verbalized understanding and agreed to speak with Dr. [name] in regards to wearing O2 at night.

## 2015-01-07 ENCOUNTER — Ambulatory Visit: Payer: Medicare Other | Admitting: Internal Medicine

## 2015-01-20 ENCOUNTER — Ambulatory Visit: Payer: Medicare Other | Admitting: Internal Medicine

## 2015-01-20 ENCOUNTER — Encounter (INDEPENDENT_AMBULATORY_CARE_PROVIDER_SITE_OTHER): Payer: Self-pay

## 2015-01-22 ENCOUNTER — Encounter: Payer: Self-pay | Admitting: Internal Medicine

## 2015-01-25 ENCOUNTER — Encounter: Payer: Self-pay | Admitting: Interventional Cardiology

## 2015-01-25 ENCOUNTER — Ambulatory Visit (INDEPENDENT_AMBULATORY_CARE_PROVIDER_SITE_OTHER): Payer: Medicare Other | Admitting: Interventional Cardiology

## 2015-01-25 VITALS — BP 132/70 | HR 82 | Ht 74.0 in | Wt 204.0 lb

## 2015-01-25 DIAGNOSIS — G4734 Idiopathic sleep related nonobstructive alveolar hypoventilation: Secondary | ICD-10-CM

## 2015-01-25 DIAGNOSIS — R059 Cough, unspecified: Secondary | ICD-10-CM

## 2015-01-25 DIAGNOSIS — R05 Cough: Secondary | ICD-10-CM

## 2015-01-25 DIAGNOSIS — R002 Palpitations: Secondary | ICD-10-CM

## 2015-01-25 NOTE — Patient Instructions (Signed)
**Note De-Identified Justine Cossin Obfuscation** Medication Instructions:  No change  Labwork: None  Testing/Procedures: None  Follow-Up: Your physician wants you to follow-up in: 5 months. You will receive a reminder letter in the mail two months in advance. If you don't receive a letter, please call our office to schedule the follow-up appointment.

## 2015-01-25 NOTE — Progress Notes (Signed)
Patient ID: Eduardo Watts., male   DOB: Jun 12, 1946, 69 y.o.   MRN: [id number]     Cardiology Office Note   Date:  01/25/2015   ID:  Eduardo Watts., DOB 1946-08-01, MRN [id number]  PCP:   Melinda Crutch, MD    No chief complaint on file. F/U palpitations   Wt Readings from Last 3 Encounters:  01/25/15 204 lb (92.534 kg)  11/06/14 202 lb 9.6 oz (91.899 kg)  10/21/14 198 lb (89.812 kg)       History of Present Illness: Eduardo Watts. is a 69 y.o. male  who had a heart cath in 9/15. This showed moderate RCA disease. He has been axious about the 30-40% RCA stenosis.  There was no significant obstructive disease. His ejection fraction was low normal. He had a pulmonary eval for cough. This is improved with less Coreg but still persists.  He has had a cough that he describes as an ant in his throat.  Worse at night. He is concerned about AFib and stroke risk. The daily palpitations have resolved.  He had a monitor showing only PACs and PVCs which correlated to sx.  He states he is given up alcohol since the day of the cath. He has an iron overload syndrome as well. He has not started exercising regularly. He mows the yard.   His father recently passed away in 23-Jan-2015.  He was recommended to wear oxygen at night.  He does not want to.     Past Medical History  Diagnosis Date  . Depression     sees Dr [name]  . Sleep disturbances   . Hyperlipidemia   . History of BPH     Dr.Dahlstedt  . Asthma     as a child    Past Surgical History  Procedure Laterality Date  . Tonsillectomy    . Left heart catheterization with coronary angiogram N/A 04/30/2014    Procedure: LEFT HEART CATHETERIZATION WITH CORONARY ANGIOGRAM;  Surgeon: [name], MD;  Location: [facility] CATH LAB;  Service: Cardiovascular;  Laterality: N/A;     Current Outpatient Prescriptions  Medication Sig Dispense Refill  . alfuzosin (UROXATRAL) 10 MG 24 hr tablet Take 10 mg by mouth daily.      Marland Kitchen aspirin 81 MG tablet Take 81 mg by mouth daily.    . B Complex Vitamins (B COMPLEX PO) Take 1 tablet by mouth daily.     Marland Kitchen CALCIUM PO Take 1 tablet by mouth daily.     . carvedilol (COREG) 3.125 MG tablet Take 3.125 mg by mouth every other day. Takes 1/2 tablet every other day    . Cholecalciferol (VITAMIN D3) 2000 UNITS TABS Take 2,000 Units by mouth daily.     . FENOFIBRATE PO Take 160 mg by mouth daily.    . finasteride (PROSCAR) 5 MG tablet Take 5 mg by mouth daily.    . fluticasone (FLONASE) 50 MCG/ACT nasal spray Place 1 spray into both nostrils daily.     . fluticasone (FLOVENT HFA) 110 MCG/ACT inhaler Inhale 2 puffs into the lungs 2 (two) times daily. 1 Inhaler 12  . hydrOXYzine (ATARAX/VISTARIL) 10 MG tablet Take 10 mg by mouth 3 (three) times daily as needed for anxiety.     Marland Kitchen KRILL OIL PO Take 500 mg by mouth daily.     Marland Kitchen LORazepam (ATIVAN) 1 MG tablet Take 1 mg by mouth at bedtime.    Marland Kitchen MAGNESIUM PO Take 1  tablet by mouth daily.     . Plant Sterols and Stanols (CHOLEST OFF PO) Take 1 tablet by mouth daily.     . Tiotropium Bromide Monohydrate (SPIRIVA RESPIMAT) 2.5 MCG/ACT AERS Inhale 2 puffs into the lungs daily. 4 g 0   No current facility-administered medications for this visit.    Allergies:   Zocor and Trazodone and nefazodone    Social History:  The patient  reports that he has never smoked. He has never used smokeless tobacco. He reports that he does not drink alcohol or use illicit drugs.   Family History:  The patient's family history includes Cancer - Prostate in his father; Dementia in his mother; Hyperlipidemia in his sister.    ROS:  Please see the history of present illness.   Otherwise, review of systems are positive for palpitations, cough.   All other systems are reviewed and negative.    PHYSICAL EXAM: VS:  BP 132/70 mmHg  Pulse 82  Ht 6\' 2"  (1.88 m)  Wt 204 lb (92.534 kg)  BMI 26.18 kg/m2 , BMI Body mass index is 26.18 kg/(m^2). GEN: Well  nourished, well developed, in no acute distress HEENT: normal Neck: no JVD, carotid bruits, or masses Cardiac: RRR; no murmurs, rubs, or gallops,no edema  Respiratory:  clear to auscultation bilaterally, normal work of breathing GI: soft, nontender, nondistended, + BS MS: no deformity or atrophy Skin: warm and dry, no rash Neuro:  Strength and sensation are intact Psych: euthymic mood, full affect     Recent Labs: 04/21/2014: BUN 11; Creatinine, Ser 0.8; Hemoglobin 15.5; Platelets 235.0; Potassium 4.3; Sodium 137   Lipid Panel No results found for: CHOL, TRIG, HDL, CHOLHDL, VLDL, LDLCALC, LDLDIRECT   Other studies Reviewed: Additional studies/ records that were reviewed today with results demonstrating: cath report reviewed.  Moderate RCA lesion.   ASSESSMENT AND PLAN:  1. Palpitations: Reviewed recent event monitor. No pathologic arrhythmia noted. He did have some premature beats. Continue medical therapy. Continue to minimize caffeine. 2. Cough: I don't think this is related to his heart. He is not taking an ACE inhibitor. 3. Hypoxia: I reviewed his sleep study. His oxygen saturations dropped to the 84% range. Oxygen was recommended at night but he inclined. I think long-term, he would benefit from supplemental oxygen at night if his oxygen saturations are dropping. I explained him that there are long-term effects on the heart from this chronic hypoxia such as atrial fibrillation as well as right heart failure. He does not want to use oxygen. He will go back and discuss this with Dr. [name]. 4. Coronary artery disease: Moderate based on recent cath. No angina at this time. Continue aggressive preventative therapy.   Current medicines are reviewed at length with the patient today.  The patient concerns regarding his medicines were addressed.  The following changes have been made:  No change  Labs/ tests ordered today include:  No orders of the defined types were placed in this  encounter.    Recommend 150 minutes/week of aerobic exercise Low fat, low carb, high fiber diet recommended  Disposition:   FU in 5 months per his request   Teresita Madura., MD  01/25/2015 8:50 AM    [facility] [address], [place], [place]  [address] Phone: [phone]; Fax: [phone]

## 2015-01-27 DIAGNOSIS — R002 Palpitations: Secondary | ICD-10-CM | POA: Insufficient documentation

## 2015-01-27 DIAGNOSIS — G4734 Idiopathic sleep related nonobstructive alveolar hypoventilation: Secondary | ICD-10-CM | POA: Insufficient documentation

## 2015-02-26 DIAGNOSIS — L218 Other seborrheic dermatitis: Secondary | ICD-10-CM | POA: Diagnosis not present

## 2015-02-26 DIAGNOSIS — L821 Other seborrheic keratosis: Secondary | ICD-10-CM | POA: Diagnosis not present

## 2015-02-26 DIAGNOSIS — L812 Freckles: Secondary | ICD-10-CM | POA: Diagnosis not present

## 2015-02-26 DIAGNOSIS — L570 Actinic keratosis: Secondary | ICD-10-CM | POA: Diagnosis not present

## 2015-04-02 ENCOUNTER — Ambulatory Visit (INDEPENDENT_AMBULATORY_CARE_PROVIDER_SITE_OTHER): Payer: Medicare Other | Admitting: Internal Medicine

## 2015-04-02 ENCOUNTER — Encounter: Payer: Self-pay | Admitting: Internal Medicine

## 2015-04-02 VITALS — BP 124/78 | HR 87 | Ht 74.0 in | Wt 209.1 lb

## 2015-04-02 DIAGNOSIS — G4700 Insomnia, unspecified: Secondary | ICD-10-CM | POA: Diagnosis not present

## 2015-04-02 DIAGNOSIS — R0600 Dyspnea, unspecified: Secondary | ICD-10-CM

## 2015-04-02 DIAGNOSIS — R0609 Other forms of dyspnea: Secondary | ICD-10-CM

## 2015-04-02 NOTE — Assessment & Plan Note (Signed)
Not clear what minimum sufficient dose of lorazepam might be. Plan-reduce lorazepam to 1/20.5 mg. Then repeat overnight oximetry on room air looking for improved oxygen desaturation with less sedation.

## 2015-04-02 NOTE — Assessment & Plan Note (Signed)
Exercise tolerance based on simple walk test unremarkable for his age and level of fitness. We have not demonstrated oxygen desaturation with rest or on 6 minute walk test. He asked whether his lorazepam at bedtime could be suppressing his breathing and contributing to desaturation. Plan-reduced lorazepam to 1/20.5 mg at bedtime to reduce drug effect on respiratory drive and awakeness. Repeat overnight oximetry on lower dose lorazepam to see how that affects desaturation during sleep.

## 2015-04-02 NOTE — Progress Notes (Signed)
09/07/14- 89 yoM referred courtesy of Dr Irish Lack after PFT, for pulmonary consultation FOLLOWS FOR: Pt had a heart cath 04/30/14 and has since noticed a cough following procedure. He had a abnormal PFT. PFT 07/13/14- minimal obstructive airways disease, insignificant response to bronchodilator, normal lung volumes and diffusion. Dry cough started around the time of his heart catheterization in September and is triggered especially by speech and especially in the morning, happening 2 or 3 times a day. Some shortness of breath and easy fatigue. Sometimes notices some wheeze or "gurgling". Denies history of pneumonia, taking Flonase for allergic rhinitis. 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 CXR 06/10/14 FINDINGS: Cardiomediastinal silhouette is within normal limits. Thoracic aortic calcification is noted. The lungs are normally to mildly hyperinflated. Slightly increased density in the medial right lung base appears to represent osseous overgrowth of the costovertebral articulation on prior abdominal CT. Lungs are otherwise clear. No pleural effusion or pneumothorax is identified. Old right seventh and eighth rib fractures are noted. CXR 06/10/14 IMPRESSION: No acute abnormality identified. Electronically Signed  By: [name]  On: 06/10/2014 09:45  34/1/16- 69 YO M never smoker followed for cough, mild obstructive disease complicated by CAD, palpitations. We had suggested he come off Coreg- cardiology has him on reduced dose with less cough. We added Flovent 110. FOLLOWS FOR: Pt states cough as improved and his wearing a heart monitor for 30 days. He states he has SOB that increases with activity. Pt states right lung pain. Denies any n/v/d or f/c/s. Denies cough. Discomfort right parasternal region 3 years, not tender. Flovent irritated throat. We reviewed chest x-ray showing old  right seventh, eighth rib fractures and he remembers childhood trauma.  04/02/15- 18 YO M never smoker followed for cough, mild obstructive disease complicated by CAD, palpitations. FOLLOWS FOR: Patient states that shortness of breath occurs with slight exertion. Patient denies cough, sinus drainage. Patient states that he received call about wearing oxygen at night and he states that he refused this service because he had not heard from our office. I pointed out we had attempted to call him several times and that he had said he wanted to wait on home oxygen until he could talk with his cardiologist about it. We reviewed the overnight oximetry report demonstrating he does qualify for oxygen during sleep. He denies any change in exercise tolerance or any active problem with dyspnea on exertion within when he considers his level of fitness. PFT in 07/2014 was reviewed again, demonstrating only minimal obstruction. He is up 3 or 4 times a night taking care of pets or going to the bathroom. Overall bedtime 9 PM and at 4 AM. He takes lorazepam from Dr. [name] for sleep. Walk Test-04/02/2015-he walked 555 feet starting with saturation 93%, falling to a nadir of 92% on room air with no oxygen desaturation. Pulse rate 100-109, regular.  ROS-see HPI Constitutional:   No-   weight loss, night sweats, fevers, chills, fatigue, lassitude. HEENT:   No-  headaches, difficulty swallowing, tooth/dental problems, sore throat,       No-  sneezing, itching, ear ache, nasal congestion, post nasal drip,  CV:  No-   chest pain, orthopnea, PND, swelling in lower extremities, anasarca,                              dizziness, +palpitations Resp: +shortness  of breath with exertion or at rest.              No-   productive cough, + non-productive cough,  No- coughing up of blood.              No-   change in color of mucus.  No- wheezing.   Skin: No-   rash or lesions. GI:  No-   heartburn, indigestion, abdominal pain,  nausea, vomiting,  GU: . MS:  No-   joint pain or swelling.   Neuro-     nothing unusual Psych:  No- change in mood or affect. + depression or anxiety.  No memory loss.  OBJ- Physical Exam    General- Alert, Oriented, Affect-appropriate, Distress- none acute Skin- rash-none, lesions- none, excoriation- none Lymphadenopathy- none Head- atraumatic            Eyes- Gross vision intact, PERRLA, conjunctivae and secretions clear            Ears- Hearing, canals-normal            Nose- Clear, no-Septal dev, mucus, polyps, erosion, perforation             Throat- Mallampati II/+ uvulopalatoplasty , mucosa clear , drainage- none, tonsils-  atrophic, + throat clearing Neck- flexible , trachea midline, no stridor , thyroid nl, carotid no bruit Chest - symmetrical excursion , unlabored           Heart/CV- RRR , no murmur , no gallop  , no rub, nl s1 s2                           - JVD- none , edema- none, stasis changes- none, varices- none           Lung- clear to P&A, wheeze- none, cough- none , dullness-none, rub- none           Chest wall-+ heart monitor; + asymmetry chest wall at sternum consistent with old rib fractures and rib cage distortion. Abd-  Br/ Gen/ Rectal- Not done, not indicated Extrem- cyanosis- none, clubbing, none, atrophy- none, strength- nl Neuro- + right lower facial weakness

## 2015-04-02 NOTE — Patient Instructions (Addendum)
Order- 6MWT- done  Order- Schedule ONOX on room air     This will be done on a night when only take 1/2 tab of lorazepam   Dx Dyspnea

## 2015-04-05 DIAGNOSIS — R7989 Other specified abnormal findings of blood chemistry: Secondary | ICD-10-CM | POA: Diagnosis not present

## 2015-04-05 DIAGNOSIS — E119 Type 2 diabetes mellitus without complications: Secondary | ICD-10-CM | POA: Diagnosis not present

## 2015-04-05 DIAGNOSIS — Z0000 Encounter for general adult medical examination without abnormal findings: Secondary | ICD-10-CM | POA: Diagnosis not present

## 2015-04-05 DIAGNOSIS — E782 Mixed hyperlipidemia: Secondary | ICD-10-CM | POA: Diagnosis not present

## 2015-04-05 DIAGNOSIS — I10 Essential (primary) hypertension: Secondary | ICD-10-CM | POA: Diagnosis not present

## 2015-04-05 DIAGNOSIS — Z125 Encounter for screening for malignant neoplasm of prostate: Secondary | ICD-10-CM | POA: Diagnosis not present

## 2015-04-05 DIAGNOSIS — E559 Vitamin D deficiency, unspecified: Secondary | ICD-10-CM | POA: Diagnosis not present

## 2015-04-27 DIAGNOSIS — R0600 Dyspnea, unspecified: Secondary | ICD-10-CM | POA: Diagnosis not present

## 2015-04-30 ENCOUNTER — Telehealth: Payer: Self-pay | Admitting: Internal Medicine

## 2015-04-30 DIAGNOSIS — N401 Enlarged prostate with lower urinary tract symptoms: Secondary | ICD-10-CM | POA: Diagnosis not present

## 2015-04-30 DIAGNOSIS — R338 Other retention of urine: Secondary | ICD-10-CM | POA: Diagnosis not present

## 2015-04-30 DIAGNOSIS — R3912 Poor urinary stream: Secondary | ICD-10-CM | POA: Diagnosis not present

## 2015-04-30 NOTE — Telephone Encounter (Signed)
Pt is aware that his ONO continues to show he needs 2L/M O2 QHS even after decreasing his Lorazepam dose(as requested per patient). Pt agrees to allow [facility] to set him up for O2 QHS.    I called Mandy to see about setting patient up on O2; states patient was set up on O2 already. Nothing more needed at this time.

## 2015-05-11 ENCOUNTER — Encounter: Payer: Self-pay | Admitting: Internal Medicine

## 2015-05-20 DIAGNOSIS — H2513 Age-related nuclear cataract, bilateral: Secondary | ICD-10-CM | POA: Diagnosis not present

## 2015-05-20 DIAGNOSIS — F329 Major depressive disorder, single episode, unspecified: Secondary | ICD-10-CM | POA: Diagnosis not present

## 2015-05-20 DIAGNOSIS — E119 Type 2 diabetes mellitus without complications: Secondary | ICD-10-CM | POA: Diagnosis not present

## 2015-07-12 NOTE — Progress Notes (Signed)
Patient ID: Eduardo Sox., male   DOB: November 27, 1945, 69 y.o.   MRN: [id number]     Cardiology Office Note   Date:  07/13/2015   ID:  Eduardo Sox., DOB February 13, 1946, MRN [id number]  PCP:   Melinda Crutch, MD    No chief complaint on file. f/u palpitations   Wt Readings from Last 3 Encounters:  07/13/15 201 lb (91.173 kg)  04/02/15 209 lb 2 oz (94.858 kg)  01/25/15 204 lb (92.534 kg)       History of Present Illness: Eduardo Millard. is a 69 y.o. male  who had a heart cath in 9/15. This showed moderate RCA disease. There was no significant obstructive disease. His ejection fraction was low normal. He had a pulmonary eval for cough. This is improved with less Coreg. He is concerned about AFib and stroke risk. He feels that he has daily palpitation. He hits his chest to help the palpitations resolve.     He had given up alcohol since the day of the cath in 2015 until a few months ago. He has an iron overload syndrome as well. He has not started exercising regularly, even though his wife walks daily. He was mowing the yard in the summer months.  He reports that he has some night sweats occasionally.  He is now on nighttime oxygen.  No need for it during the day.   Since the last visit, his exercise frequency has decreased.  He is not doing any exercise.  He feels less stamina.  He has also gone back to drinking alcohol. He knows he needs to cut back on this. He reports drinking half a bottle per day.    Past Medical History  Diagnosis Date  . Depression     sees Dr [name]  . Sleep disturbances   . Hyperlipidemia   . History of BPH     Dr.Dahlstedt  . Asthma     as a child    Past Surgical History  Procedure Laterality Date  . Tonsillectomy    . Left heart catheterization with coronary angiogram N/A 04/30/2014    Procedure: LEFT HEART CATHETERIZATION WITH CORONARY ANGIOGRAM;  Surgeon: [name], MD;  Location: [facility] CATH LAB;  Service: Cardiovascular;   Laterality: N/A;     Current Outpatient Prescriptions  Medication Sig Dispense Refill  . alfuzosin (UROXATRAL) 10 MG 24 hr tablet Take 10 mg by mouth daily.    Marland Kitchen aspirin 81 MG tablet Take 81 mg by mouth daily.    . B Complex Vitamins (B COMPLEX PO) Take 1 tablet by mouth daily.     Marland Kitchen CALCIUM PO Take 1 tablet by mouth daily.     . carvedilol (COREG) 3.125 MG tablet Take 3.125 mg by mouth every other day. Takes 1/2 tablet every other day    . Cholecalciferol (VITAMIN D3) 2000 UNITS TABS Take 2,000 Units by mouth daily.     . FENOFIBRATE PO Take 160 mg by mouth daily.    . finasteride (PROSCAR) 5 MG tablet Take 5 mg by mouth daily.    . fluticasone (FLONASE) 50 MCG/ACT nasal spray Place 1 spray into both nostrils daily.     . fluticasone (FLOVENT HFA) 110 MCG/ACT inhaler Inhale 2 puffs into the lungs 2 (two) times daily. 1 Inhaler 12  . hydrOXYzine (ATARAX/VISTARIL) 10 MG tablet Take 10 mg by mouth 3 (three) times daily as needed for anxiety.     Marland Kitchen KRILL OIL PO  Take 500 mg by mouth daily.     Marland Kitchen LORazepam (ATIVAN) 1 MG tablet Take 1 mg by mouth at bedtime.    Marland Kitchen MAGNESIUM PO Take 1 tablet by mouth daily.     . Plant Sterols and Stanols (CHOLEST OFF PO) Take 1 tablet by mouth daily.     . Tiotropium Bromide Monohydrate (SPIRIVA RESPIMAT) 2.5 MCG/ACT AERS Inhale 2 puffs into the lungs daily. 4 g 0   No current facility-administered medications for this visit.    Allergies:   Zocor and Trazodone and nefazodone    Social History:  The patient  reports that he has never smoked. He has never used smokeless tobacco. He reports that he does not drink alcohol or use illicit drugs.   Family History:  The patient's family history includes Cancer - Prostate in his father; Dementia in his mother; Hyperlipidemia in his sister; Stroke in his mother. There is no history of Heart attack or Hypertension.    ROS:  Please see the history of present illness.   Otherwise, review of systems are positive for  dizziness- improved with less Coreg. He is concerned about the side effects ativan.   All other systems are reviewed and negative.    PHYSICAL EXAM: VS:  BP 108/60 mmHg  Pulse 89  Ht 6\' 2"  (1.88 m)  Wt 201 lb (91.173 kg)  BMI 25.80 kg/m2  SpO2 96% , BMI Body mass index is 25.8 kg/(m^2). GEN: Well nourished, well developed, in no acute distress HEENT: normal Neck: no JVD, carotid bruits, or masses Cardiac: RRR; no murmurs, rubs, or gallops,no edema  Respiratory:  clear to auscultation bilaterally, normal work of breathing GI: soft, nontender, nondistended, + BS MS: no deformity or atrophy Skin: warm and dry, no rash Neuro:  Strength and sensation are intact Psych: euthymic mood, full affect     Recent Labs: No results found for requested labs within last 365 days.   Lipid Panel No results found for: CHOL, TRIG, HDL, CHOLHDL, VLDL, LDLCALC, LDLDIRECT   Other studies Reviewed: Additional studies/ records that were reviewed today with results demonstrating: Monitor in 3/16 showed only symptomatic PACs and PVCs, no AFib.   ASSESSMENT AND PLAN:  1. CAD: Moderate - noted on 9/15 cath. Continue risk factor modification. He will try to exercise more and eat healthy. Hopefully, his stamina will improve with more exercise as well. Cough is not related to his CAD. Better with decreased Coreg. We'll not plan to go above 3.125 mg twice a day.  At this point, he is cutting the pills in half. 2. Abnormal ECG: He has had persistent T-wave inversion from prior ECG.  His ejection fraction of 50-55% is within the normal range. 3. Needs to abstain from alcohol after starting drinking again a few months ago. This will be good for both his heart and his liver.  We spoke about this at length. 4. Cough: Evaluated by pulmonary. He did not respond well to a cortisone inhaler.  Continue nighttime oxygen.   Current medicines are reviewed at length with the patient today.  The patient concerns  regarding his medicines were addressed.  The following changes have been made:  No change  Labs/ tests ordered today include:  No orders of the defined types were placed in this encounter.    Recommend 150 minutes/week of aerobic exercise Low fat, low carb, high fiber diet recommended  Disposition:   FU in 6 months  6 months Signed, [name]., MD  07/13/2015  8:48 AM    [facility] [address], [place], [place]  [address] Phone: [phone]; Fax: [phone]

## 2015-07-13 ENCOUNTER — Ambulatory Visit (INDEPENDENT_AMBULATORY_CARE_PROVIDER_SITE_OTHER): Payer: Medicare Other | Admitting: Interventional Cardiology

## 2015-07-13 ENCOUNTER — Encounter: Payer: Self-pay | Admitting: Interventional Cardiology

## 2015-07-13 VITALS — BP 108/60 | HR 89 | Ht 74.0 in | Wt 201.0 lb

## 2015-07-13 DIAGNOSIS — R9431 Abnormal electrocardiogram [ECG] [EKG]: Secondary | ICD-10-CM | POA: Diagnosis not present

## 2015-07-13 DIAGNOSIS — Z7289 Other problems related to lifestyle: Secondary | ICD-10-CM

## 2015-07-13 DIAGNOSIS — Z789 Other specified health status: Secondary | ICD-10-CM

## 2015-07-13 DIAGNOSIS — I2510 Atherosclerotic heart disease of native coronary artery without angina pectoris: Secondary | ICD-10-CM | POA: Diagnosis not present

## 2015-07-13 NOTE — Patient Instructions (Signed)
Medication Instructions:  Same-no changes  Labwork: None  Testing/Procedures: None  Follow-Up: Your physician wants you to follow-up in: 6 months. You will receive a reminder letter in the mail two months in advance. If you don't receive a letter, please call our office to schedule the follow-up appointment.      If you need a refill on your cardiac medications before your next appointment, please call your pharmacy.   

## 2015-08-04 ENCOUNTER — Ambulatory Visit: Payer: Medicare Other | Admitting: Internal Medicine

## 2015-08-04 ENCOUNTER — Encounter: Payer: Self-pay | Admitting: Internal Medicine

## 2015-08-04 VITALS — BP 110/74 | HR 81 | Ht 74.0 in | Wt 204.0 lb

## 2015-08-04 DIAGNOSIS — G4733 Obstructive sleep apnea (adult) (pediatric): Secondary | ICD-10-CM

## 2015-08-04 NOTE — Progress Notes (Signed)
09/07/14- 13 yoM referred courtesy of Dr Irish Lack after PFT, for pulmonary consultation FOLLOWS FOR: Pt had a heart cath 04/30/14 and has since noticed a cough following procedure. He had a abnormal PFT. PFT 07/13/14- minimal obstructive airways disease, insignificant response to bronchodilator, normal lung volumes and diffusion. Dry cough started around the time of his heart catheterization in September and is triggered especially by speech and especially in the morning, happening 2 or 3 times a day. Some shortness of breath and easy fatigue. Sometimes notices some wheeze or "gurgling". Denies history of pneumonia, taking Flonase for allergic rhinitis. History of tonsillectomy with palatoplasty at age 46 and resection of a benign tumor from his right cheek leaving residual right facial weakness. CXR 06/10/14 FINDINGS: Cardiomediastinal silhouette is within normal limits. Thoracic aortic calcification is noted. The lungs are normally to mildly hyperinflated. Slightly increased density in the medial right lung base appears to represent osseous overgrowth of the costovertebral articulation on prior abdominal CT. Lungs are otherwise clear. No pleural effusion or pneumothorax is identified. Old right seventh and eighth rib fractures are noted. CXR 06/10/14 IMPRESSION: No acute abnormality identified. Electronically Signed  By: [name]  On: 06/10/2014 09:45  34/1/16- 53 YO M never smoker followed for cough, mild obstructive disease complicated by CAD, palpitations. We had suggested he come off Coreg- cardiology has him on reduced dose with less cough. We added Flovent 110. FOLLOWS FOR: Pt states cough as improved and his wearing a heart monitor for 30 days. He states he has SOB that increases with activity. Pt states right lung pain. Denies any n/v/d or f/c/s. Denies cough. Discomfort right parasternal region 3 years, not tender. Flovent irritated throat. We reviewed chest x-ray showing old  right seventh, eighth rib fractures and he remembers childhood trauma.  04/02/15- 36 YO M never smoker followed for cough, mild obstructive disease complicated by CAD, palpitations. FOLLOWS FOR: Patient states that shortness of breath occurs with slight exertion. Patient denies cough, sinus drainage. Patient states that he received call about wearing oxygen at night and he states that he refused this service because he had not heard from our office. I pointed out we had attempted to call him several times and that he had said he wanted to wait on home oxygen until he could talk with his cardiologist about it. We reviewed the overnight oximetry report demonstrating he does qualify for oxygen during sleep. He denies any change in exercise tolerance or any active problem with dyspnea on exertion within when he considers his level of fitness. PFT in 07/2014 was reviewed again, demonstrating only minimal obstruction. He is up 3 or 4 times a night taking care of pets or going to the bathroom. Overall bedtime 9 PM 4 AM. He takes lorazepam from Dr. [name] for sleep. Walk Test-04/02/2015-he walked 555 feet starting with saturation 93%, falling to a nadir of 92% on room air with no oxygen desaturation. Pulse rate 100-109, regular.  08/04/2015- 68 year old male never smoker followed for cough, mild obstructive disease complicated by CAD, palpitation FOLLOWS FOR Pt here to review recent ONO results from [place]. Pt states SOB is not any better since last visit. Pt is using nocturnal O2  O2 2 L sleep/[facility] Followed by cardiology-EF 50-55% with regular exercise recommended ONOX-    ROS-see HPI Constitutional:   No-   weight loss, night sweats, fevers, chills, fatigue, lassitude. HEENT:   No-  headaches, difficulty swallowing, tooth/dental problems, sore throat,       No-  sneezing,  itching, ear ache, nasal congestion, post nasal drip,  CV:  No-   chest pain, orthopnea, PND, swelling in lower extremities,  anasarca,                              dizziness, +palpitations Resp: +shortness of breath with exertion or at rest.              No-   productive cough, + non-productive cough,  No- coughing up of blood.              No-   change in color of mucus.  No- wheezing.   Skin: No-   rash or lesions. GI:  No-   heartburn, indigestion, abdominal pain, nausea, vomiting,  GU: . MS:  No-   joint pain or swelling.   Neuro-     nothing unusual Psych:  No- change in mood or affect. + depression or anxiety.  No memory loss.  OBJ- Physical Exam    General- Alert, Oriented, Affect-appropriate, Distress- none acute Skin- rash-none, lesions- none, excoriation- none Lymphadenopathy- none Head- atraumatic            Eyes- Gross vision intact, PERRLA, conjunctivae and secretions clear            Ears- Hearing, canals-normal            Nose- Clear, no-Septal dev, mucus, polyps, erosion, perforation             Throat- Mallampati II/+ uvulopalatoplasty , mucosa clear , drainage- none, tonsils-  atrophic, + throat clearing Neck- flexible , trachea midline, no stridor , thyroid nl, carotid no bruit Chest - symmetrical excursion , unlabored           Heart/CV- RRR , no murmur , no gallop  , no rub, nl s1 s2                           - JVD- none , edema- none, stasis changes- none, varices- none           Lung- clear to P&A, wheeze- none, cough- none , dullness-none, rub- none           Chest wall-+ heart monitor; + asymmetry chest wall at sternum consistent with old rib fractures and rib cage distortion. Abd-  Br/ Gen/ Rectal- Not done, not indicated Extrem- cyanosis- none, clubbing, none, atrophy- none, strength- nl Neuro- + right lower facial weakness

## 2015-08-04 NOTE — Patient Instructions (Signed)
Order- schedule NPSG split protocol  Dx OSA    Complicated by CAD,

## 2015-08-13 DIAGNOSIS — F329 Major depressive disorder, single episode, unspecified: Secondary | ICD-10-CM | POA: Diagnosis not present

## 2015-08-30 DIAGNOSIS — L820 Inflamed seborrheic keratosis: Secondary | ICD-10-CM | POA: Diagnosis not present

## 2015-08-30 DIAGNOSIS — L821 Other seborrheic keratosis: Secondary | ICD-10-CM | POA: Diagnosis not present

## 2015-08-30 DIAGNOSIS — L812 Freckles: Secondary | ICD-10-CM | POA: Diagnosis not present

## 2015-08-30 DIAGNOSIS — D692 Other nonthrombocytopenic purpura: Secondary | ICD-10-CM | POA: Diagnosis not present

## 2015-08-30 DIAGNOSIS — L218 Other seborrheic dermatitis: Secondary | ICD-10-CM | POA: Diagnosis not present

## 2015-08-30 DIAGNOSIS — D1801 Hemangioma of skin and subcutaneous tissue: Secondary | ICD-10-CM | POA: Diagnosis not present

## 2015-09-01 DIAGNOSIS — J029 Acute pharyngitis, unspecified: Secondary | ICD-10-CM | POA: Diagnosis not present

## 2015-09-29 ENCOUNTER — Ambulatory Visit (HOSPITAL_BASED_OUTPATIENT_CLINIC_OR_DEPARTMENT_OTHER): Payer: Medicare Other | Attending: Internal Medicine | Admitting: *Deleted

## 2015-09-29 DIAGNOSIS — G4733 Obstructive sleep apnea (adult) (pediatric): Secondary | ICD-10-CM | POA: Diagnosis not present

## 2015-09-29 DIAGNOSIS — Z79899 Other long term (current) drug therapy: Secondary | ICD-10-CM | POA: Insufficient documentation

## 2015-09-29 DIAGNOSIS — G4736 Sleep related hypoventilation in conditions classified elsewhere: Secondary | ICD-10-CM | POA: Insufficient documentation

## 2015-09-29 DIAGNOSIS — R0683 Snoring: Secondary | ICD-10-CM | POA: Diagnosis not present

## 2015-09-29 DIAGNOSIS — F419 Anxiety disorder, unspecified: Secondary | ICD-10-CM | POA: Insufficient documentation

## 2015-09-29 DIAGNOSIS — F40240 Claustrophobia: Secondary | ICD-10-CM | POA: Insufficient documentation

## 2015-10-03 DIAGNOSIS — G4733 Obstructive sleep apnea (adult) (pediatric): Secondary | ICD-10-CM | POA: Diagnosis not present

## 2015-10-03 NOTE — Progress Notes (Signed)
   Patient Name: Eduardo Watts, Eduardo Watts Date: 09/29/2015 Gender: Male D.O.B: 1946/07/13 Age (years): 65 Referring Provider: Baird Lyons MD, ABSM Height (inches): 72 Interpreting Physician: Baird Lyons MD, ABSM Weight (lbs): 204 RPSGT: Gerhard Perches BMI: 28 MRN: [id number] Neck Size: 17.00 CLINICAL INFORMATION Sleep Study Type: NPSG Indication for sleep study: OSA Epworth Sleepiness Score: 1  SLEEP STUDY TECHNIQUE As per the AASM Manual for the Scoring of Sleep and Associated Events v2.3 (April 2016) with a hypopnea requiring 4% desaturations. The channels recorded and monitored were frontal, central and occipital EEG, electrooculogram (EOG), submentalis EMG (chin), nasal and oral airflow, thoracic and abdominal wall motion, anterior tibialis EMG, snore microphone, electrocardiogram, and pulse oximetry.  MEDICATIONS Patient's medications include: charted for review Medications self-administered by patient during sleep study : Lorazepam  SLEEP ARCHITECTURE The study was initiated at 10:06:55 PM and ended at 4:23:46 AM. Sleep onset time was 40.3 minutes and the sleep efficiency was 48.6%. The total sleep time was 183.0 minutes. Stage REM latency was 70.5 minutes. The patient spent 19.67% of the night in stage N1 sleep, 74.32% in stage N2 sleep, 0.00% in stage N3 and 6.01% in REM. Alpha intrusion was absent. Supine sleep was 80.05%. Wake after sleep onset 153 minutes  RESPIRATORY PARAMETERS The overall apnea/hypopnea index (AHI) was 3.0 per hour. There were 4 total apneas, including 4 obstructive, 0 central and 0 mixed apneas. There were 5 hypopneas and 8 RERAs. The AHI during Stage REM sleep was 5.5 per hour. AHI while supine was 2.9 per hour. The mean oxygen saturation was 90.12%. The minimum SpO2 during sleep was 85.00%. Soft snoring was noted during this study.  CARDIAC DATA The 2 lead EKG demonstrated sinus rhythm. The mean heart rate was 69.00 beats per minute.  Other EKG findings include: None.  LEG MOVEMENT DATA The total PLMS were 0 with a resulting PLMS index of 0.00. Associated arousal with leg movement index was 0.0 .  IMPRESSIONS - No significant obstructive sleep apnea occurred during this study (AHI = 3.0/h). - No significant central sleep apnea occurred during this study (CAI = 0.0/h). - Mild oxygen desaturation was noted during this study (Min O2 = 85.00%). - The patient snored with Soft snoring volume. - No cardiac abnormalities were noted during this study. - Clinically significant periodic limb movements did not occur during sleep. No significant associated arousals. - Anxiety with claustrophobia commented upon by technician. Patient took lorazepam for this.  DIAGNOSIS - Nocturnal Hypoxemia (327.26 [G47.36 ICD-10]  RECOMMENDATIONS - Consider assessing for oxygen desaturation during sleep - Avoid alcohol, sedatives and other CNS depressants that may worsen sleep apnea and disrupt normal sleep architecture. - Sleep hygiene should be reviewed to assess factors that may improve sleep quality. - Weight management and regular exercise should be initiated or continued if appropriate.  Deneise Lever Diplomate, American Board of Sleep Medicine  ELECTRONICALLY SIGNED ON:  10/03/2015, 9:18 AM [place] PH: [phone]   FX: [phone] [place]

## 2015-10-07 DIAGNOSIS — R61 Generalized hyperhidrosis: Secondary | ICD-10-CM | POA: Diagnosis not present

## 2015-10-07 DIAGNOSIS — E119 Type 2 diabetes mellitus without complications: Secondary | ICD-10-CM | POA: Diagnosis not present

## 2015-10-07 DIAGNOSIS — I10 Essential (primary) hypertension: Secondary | ICD-10-CM | POA: Diagnosis not present

## 2015-10-07 DIAGNOSIS — R634 Abnormal weight loss: Secondary | ICD-10-CM | POA: Diagnosis not present

## 2015-10-07 DIAGNOSIS — R6882 Decreased libido: Secondary | ICD-10-CM | POA: Diagnosis not present

## 2015-11-04 DIAGNOSIS — F329 Major depressive disorder, single episode, unspecified: Secondary | ICD-10-CM | POA: Diagnosis not present

## 2015-11-15 DIAGNOSIS — H2513 Age-related nuclear cataract, bilateral: Secondary | ICD-10-CM | POA: Diagnosis not present

## 2015-11-15 DIAGNOSIS — R7303 Prediabetes: Secondary | ICD-10-CM | POA: Diagnosis not present

## 2015-11-26 DIAGNOSIS — E559 Vitamin D deficiency, unspecified: Secondary | ICD-10-CM | POA: Diagnosis not present

## 2015-11-26 DIAGNOSIS — T148 Other injury of unspecified body region: Secondary | ICD-10-CM | POA: Diagnosis not present

## 2015-11-26 DIAGNOSIS — R5383 Other fatigue: Secondary | ICD-10-CM | POA: Diagnosis not present

## 2015-11-26 DIAGNOSIS — E291 Testicular hypofunction: Secondary | ICD-10-CM | POA: Diagnosis not present

## 2015-11-30 DIAGNOSIS — E291 Testicular hypofunction: Secondary | ICD-10-CM | POA: Diagnosis not present

## 2015-12-09 ENCOUNTER — Ambulatory Visit (INDEPENDENT_AMBULATORY_CARE_PROVIDER_SITE_OTHER): Payer: Medicare Other | Admitting: Internal Medicine

## 2015-12-09 ENCOUNTER — Encounter: Payer: Self-pay | Admitting: Internal Medicine

## 2015-12-09 VITALS — BP 122/64 | HR 71 | Ht 74.0 in | Wt 203.0 lb

## 2015-12-09 DIAGNOSIS — G4734 Idiopathic sleep related nonobstructive alveolar hypoventilation: Secondary | ICD-10-CM | POA: Diagnosis not present

## 2015-12-09 DIAGNOSIS — R0609 Other forms of dyspnea: Secondary | ICD-10-CM

## 2015-12-09 NOTE — Progress Notes (Signed)
09/07/14- 59 yoM referred courtesy of Dr Irish Lack after PFT, for pulmonary consultation FOLLOWS FOR: Pt had a heart cath 04/30/14 and has since noticed a cough following procedure. He had a abnormal PFT. PFT 07/13/14- minimal obstructive airways disease, insignificant response to bronchodilator, normal lung volumes and diffusion. Dry cough started around the time of his heart catheterization in September and is triggered especially by speech and especially in the morning, happening 2 or 3 times a day. Some shortness of breath and easy fatigue. Sometimes notices some wheeze or "gurgling". Denies history of pneumonia, taking Flonase for allergic rhinitis. 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 CXR 06/10/14 FINDINGS: Cardiomediastinal silhouette is within normal limits. Thoracic aortic calcification is noted. The lungs are normally to mildly hyperinflated. Slightly increased density in the medial right lung base appears to represent osseous overgrowth of the costovertebral articulation on prior abdominal CT. Lungs are otherwise clear. No pleural effusion or pneumothorax is identified. Old right seventh and eighth rib fractures are noted. CXR 06/10/14 IMPRESSION: No acute abnormality identified. Electronically Signed  By: [name]  On: 06/10/2014 09:45  34/1/16- 76 YO M never smoker followed for cough, mild obstructive disease complicated by CAD, palpitations. We had suggested he come off Coreg- cardiology has him on reduced dose with less cough. We added Flovent 110. FOLLOWS FOR: Pt states cough as improved and his wearing a heart monitor for 30 days. He states he has SOB that increases with activity. Pt states right lung pain. Denies any n/v/d or f/c/s. Denies cough. Discomfort right parasternal region 3 years, not tender. Flovent irritated throat. We reviewed chest x-ray showing old  right seventh, eighth rib fractures and he remembers childhood trauma.  04/02/15- 69 YO M never smoker followed for cough, mild obstructive disease complicated by CAD, palpitations. FOLLOWS FOR: Patient states that shortness of breath occurs with slight exertion. Patient denies cough, sinus drainage. Patient states that he received call about wearing oxygen at night and he states that he refused this service because he had not heard from our office. I pointed out we had attempted to call him several times and that he had said he wanted to wait on home oxygen until he could talk with his cardiologist about it. We reviewed the overnight oximetry report demonstrating he does qualify for oxygen during sleep. He denies any change in exercise tolerance or any active problem with dyspnea on exertion within when he considers his level of fitness. PFT in 07/2014 was reviewed again, demonstrating only minimal obstruction. He is up 3 or 4 times a night taking care of pets or going to the bathroom. Overall bedtime 9 PM 4 AM. He takes lorazepam from Dr. [name] for sleep. Walk Test-04/02/2015-he walked 555 feet starting with saturation 93%, falling to a nadir of 92% on room air with no oxygen desaturation. Pulse rate 100-109, regular.  08/04/2015- 70 year old male never smoker followed for cough, mild obstructive disease complicated by CAD, palpitation FOLLOWS FOR Pt here to review recent ONO results from [place]. Pt states SOB is not any better since last visit. Pt is using nocturnal O2  O2 2 L sleep/[facility] Followed by cardiology-EF 50-55% with regular exercise recommended ONOX- 04/25/15- qualified for sleep O2 with 50 minutes<= 88% room air  12/09/2015-70 year old male never smoker followed for cough, mild obstructive airways disease, complicated by CAD, palpitations O2 2L/ sleep/ [facility] FOLLOWS FOR: Review Sleep study with patient;pt would also  like to discuss his O2 usage at night through [place]. NPSG  09/29/15- WNL, AHI 3/ hr, with desat to 85%, mean sat 90.1% He has been using oxygen for sleep. We compared results of overnight oximetry in September with polysomnogram in February. He denies significant cough or wheeze, or daytime dyspnea.  ROS-see HPI Constitutional:   No-   weight loss, night sweats, fevers, chills, fatigue, lassitude. HEENT:   No-  headaches, difficulty swallowing, tooth/dental problems, sore throat,       No-  sneezing, itching, ear ache, nasal congestion, post nasal drip,  CV:  No-   chest pain, orthopnea, PND, swelling in lower extremities, anasarca,                              dizziness, +palpitations Resp: +shortness of breath with exertion or at rest.              No-   productive cough, + non-productive cough,  No- coughing up of blood.              No-   change in color of mucus.  No- wheezing.   Skin: No-   rash or lesions. GI:  No-   heartburn, indigestion, abdominal pain, nausea, vomiting,  GU: . MS:  No-   joint pain or swelling.   Neuro-     nothing unusual Psych:  No- change in mood or affect. + depression or anxiety.  No memory loss.  OBJ- Physical Exam    General- Alert, Oriented, Affect-appropriate, Distress- none acute Skin- rash-none, lesions- none, excoriation- none Lymphadenopathy- none Head- atraumatic            Eyes- Gross vision intact, PERRLA, conjunctivae and secretions clear            Ears- Hearing, canals-normal            Nose- Clear, no-Septal dev, mucus, polyps, erosion, perforation             Throat- Mallampati II/+ uvulopalatoplasty , mucosa clear , drainage- none, tonsils-  atrophic, + throat clearing Neck- flexible , trachea midline, no stridor , thyroid nl, carotid no bruit Chest - symmetrical excursion , unlabored           Heart/CV- RRR , no murmur , no gallop  , no rub, nl s1 s2                           - JVD- none , edema- none, stasis changes- none, varices- none           Lung- clear to P&A, wheeze- none, cough- none  , dullness-none, rub- none           Chest wall-+ heart monitor; + asymmetry chest wall at sternum consistent with old rib fractures and rib cage distortion. Abd-  Br/ Gen/ Rectal- Not done, not indicated Extrem- cyanosis- none, clubbing, none, atrophy- none, strength- nl Neuro- + right lower facial weakness

## 2015-12-09 NOTE — Patient Instructions (Signed)
Ok to continue sleep oxygen at 1L/ m as discussed.  Please call if needed

## 2015-12-10 NOTE — Assessment & Plan Note (Signed)
At today's visit he is not describing exertional dyspnea and insists he feels quite well.

## 2015-12-10 NOTE — Assessment & Plan Note (Signed)
Marginal need for sleep-related oxygen. He would like to try reducing from 2 L to 1 L during sleep Plan-try O2 1 L sleep/[facility]

## 2016-01-16 NOTE — Progress Notes (Signed)
Patient ID: Eduardo Sox., male   DOB: [birth year], 70 y.o.   MRN: [id number]     Cardiology Office Note   Date:  01/17/2016   ID:  Eduardo Sox., DOB Apr 20, 1946, MRN [id number]  PCP:   Eduardo Crutch, MD    No chief complaint on file. f/u palpitations, CAD   Wt Readings from Last 3 Encounters:  01/17/16 202 lb (91.627 kg)  12/09/15 203 lb (92.08 kg)  09/29/15 204 lb (92.534 kg)       History of Present Illness: Eduardo Watts. is a 70 y.o. male  who had a heart cath in 9/15. This showed moderate RCA disease. There was no significant obstructive disease. His ejection fraction was low normal. He had a pulmonary eval for cough. This is improved with less Coreg. He is concerned about AFib and stroke risk. He feels that he has daily palpitation. He hits his chest to help the palpitations resolve.  Prior w/u for AFib has been negative.  He had given up alcohol since the day of the cath in 2015 until a few months ago. He has an iron overload syndrome as well. He has not started exercising regularly, even though his wife walks daily. He was mowing the yard in the summer months.    He is now on nighttime oxygen- but only uses it half the night.  He had another sleep study in 2016 and it was normal.  No need for it during the day.   Since the last visit, his exercise frequency has not increased.  He is not doing much exercise.  He feels less stamina.  He is still drinking alcohol. He knows he needs to cut back on this. He reports drinking half a bottle per day.  He continues to "sin" (to use his word).  I explained to him that this is more a health concern rather than a moral concern.    Past Medical History  Diagnosis Date  . Depression     sees Dr Eduardo Watts  . Sleep disturbances   . Hyperlipidemia   . History of BPH     Dr.Dahlstedt  . Asthma     as a child    Past Surgical History  Procedure Laterality Date  . Tonsillectomy    . Left heart catheterization  with coronary angiogram N/A 04/30/2014    Procedure: LEFT HEART CATHETERIZATION WITH CORONARY ANGIOGRAM;  Surgeon: [name], MD;  Location: [facility] CATH LAB;  Service: Cardiovascular;  Laterality: N/A;     Current Outpatient Prescriptions  Medication Sig Dispense Refill  . alfuzosin (UROXATRAL) 10 MG 24 hr tablet Take 10 mg by mouth daily.    Marland Kitchen aspirin 81 MG tablet Take 81 mg by mouth daily.    . B Complex Vitamins (B COMPLEX PO) Take 1 tablet by mouth daily.     Marland Kitchen CALCIUM PO Take 1 tablet by mouth daily.     . carvedilol (COREG) 3.125 MG tablet Take 3.125 mg by mouth daily. Takes 1/2 tablet by mouth daily    . Cholecalciferol (VITAMIN D3) 2000 UNITS TABS Take 2,000 Units by mouth daily.     . finasteride (PROSCAR) 5 MG tablet Take 5 mg by mouth daily.    . fluticasone (FLONASE) 50 MCG/ACT nasal spray Place 1 spray into both nostrils daily.     . hydrOXYzine (ATARAX/VISTARIL) 10 MG tablet Take 10 mg by mouth 3 (three) times daily as needed for anxiety.     Marland Kitchen  KRILL OIL PO Take 500 mg by mouth daily.     Marland Kitchen LORazepam (ATIVAN) 1 MG tablet Take 1 mg by mouth at bedtime.    Marland Kitchen MAGNESIUM PO Take 1 tablet by mouth daily.      No current facility-administered medications for this visit.    Allergies:   Zocor and Trazodone and nefazodone    Social History:  The patient  reports that he has never smoked. He has never used smokeless tobacco. He reports that he does not drink alcohol or use illicit drugs.   Family History:  The patient's family history includes Cancer - Prostate in his father; Dementia in his mother; Hyperlipidemia in his sister; Stroke in his mother. There is no history of Heart attack or Hypertension.    ROS:  Please see the history of present illness.   Otherwise, review of systems are positive for dizziness- improved with less Coreg. He is concerned about the side effects ativan.   All other systems are reviewed and negative.    PHYSICAL EXAM: VS:  BP 118/70 mmHg   Pulse 80  Ht 6\' 2"  (1.88 m)  Wt 202 lb (91.627 kg)  BMI 25.92 kg/m2  SpO2 93% , BMI Body mass index is 25.92 kg/(m^2). GEN: Well nourished, well developed, in no acute distress HEENT: normal Neck: no JVD, carotid bruits, or masses Cardiac: RRR; no murmurs, rubs, or gallops,no edema  Respiratory:  clear to auscultation bilaterally, normal work of breathing GI: soft, nontender, nondistended, + BS MS: no deformity or atrophy Skin: warm and dry, no rash Neuro:  Strength and sensation are intact Psych: euthymic mood, full affect     Recent Labs: No results found for requested labs within last 365 days.   Lipid Panel No results found for: CHOL, TRIG, HDL, CHOLHDL, VLDL, LDLCALC, LDLDIRECT   Other studies Reviewed: Additional studies/ records that were reviewed today with results demonstrating: Monitor in 3/16 showed only symptomatic PACs and PVCs, no AFib.   ASSESSMENT AND PLAN:  1. CAD: Moderate - noted on 9/15 cath. Continue risk factor modification. He will try to exercise more and eat healthy.  Goal as noted below. Hopefully, his stamina will improve with more exercise as well. Cough is not related to his CAD. Better with decreased Coreg. We'll not plan to go above 3.125 mg twice a day.  At this point, he is cutting the pills in half.  BP controlled. 2. Abnormal ECG: He has had persistent T-wave inversion from prior ECG.  His ejection fraction of 50-55% is within the normal range at last check in 2015.  No signs of CHF.  I did warn that excess alcohol can cause CHF. 3. Needs to abstain from alcohol after starting drinking ago. This will be good for both his heart and his liver.  We spoke about this at length.  He cut back after his cath.   4. Cough: Evaluated by pulmonary. He did not respond well to a cortisone inhaler.  Continue nighttime oxygen per his request.  DOE will improve with more exercise.   Current medicines are reviewed at length with the patient today.  The  patient concerns regarding his medicines were addressed.  The following changes have been made:  No change  Labs/ tests ordered today include:  No orders of the defined types were placed in this encounter.    Recommend 150 minutes/week of aerobic exercise Low fat, low carb, high fiber diet recommended  Disposition:   FU in 6 months  6 months Signed, Larae Grooms, MD  01/17/2016 8:20 AM    [facility] [address], [place], [place]  [address] Phone: [phone]; Fax: [phone]

## 2016-01-17 ENCOUNTER — Encounter: Payer: Self-pay | Admitting: Interventional Cardiology

## 2016-01-17 ENCOUNTER — Ambulatory Visit (INDEPENDENT_AMBULATORY_CARE_PROVIDER_SITE_OTHER): Payer: Medicare Other | Admitting: Interventional Cardiology

## 2016-01-17 VITALS — BP 118/70 | HR 80 | Ht 74.0 in | Wt 202.0 lb

## 2016-01-17 DIAGNOSIS — R9431 Abnormal electrocardiogram [ECG] [EKG]: Secondary | ICD-10-CM | POA: Diagnosis not present

## 2016-01-17 DIAGNOSIS — R0609 Other forms of dyspnea: Secondary | ICD-10-CM | POA: Diagnosis not present

## 2016-01-17 DIAGNOSIS — I2510 Atherosclerotic heart disease of native coronary artery without angina pectoris: Secondary | ICD-10-CM | POA: Diagnosis not present

## 2016-01-17 NOTE — Patient Instructions (Signed)
Medication Instructions:  Same-no changes  Labwork: None  Testing/Procedures: None  Follow-Up: Your physician wants you to follow-up in: 6 months. You will receive a reminder letter in the mail two months in advance. If you don't receive a letter, please call our office to schedule the follow-up appointment.      If you need a refill on your cardiac medications before your next appointment, please call your pharmacy.   

## 2016-02-09 ENCOUNTER — Telehealth: Payer: Self-pay | Admitting: Internal Medicine

## 2016-02-09 DIAGNOSIS — E291 Testicular hypofunction: Secondary | ICD-10-CM | POA: Diagnosis not present

## 2016-02-09 NOTE — Telephone Encounter (Signed)
LVM for pt to return call

## 2016-02-10 NOTE — Telephone Encounter (Signed)
lmtcb x2 for pt. 

## 2016-02-10 NOTE — Telephone Encounter (Signed)
Patient returned call, CB is [phone]

## 2016-02-10 NOTE — Telephone Encounter (Signed)
Spoke with pt. He needs to make an appointment to recertify his oxygen needs. OV has been scheduled for 03/07/16 at 9am. Nothing further was needed.

## 2016-02-21 DIAGNOSIS — I788 Other diseases of capillaries: Secondary | ICD-10-CM | POA: Diagnosis not present

## 2016-02-21 DIAGNOSIS — L570 Actinic keratosis: Secondary | ICD-10-CM | POA: Diagnosis not present

## 2016-02-21 DIAGNOSIS — L820 Inflamed seborrheic keratosis: Secondary | ICD-10-CM | POA: Diagnosis not present

## 2016-02-21 DIAGNOSIS — N481 Balanitis: Secondary | ICD-10-CM | POA: Diagnosis not present

## 2016-02-21 DIAGNOSIS — D1801 Hemangioma of skin and subcutaneous tissue: Secondary | ICD-10-CM | POA: Diagnosis not present

## 2016-02-21 DIAGNOSIS — D2261 Melanocytic nevi of right upper limb, including shoulder: Secondary | ICD-10-CM | POA: Diagnosis not present

## 2016-02-21 DIAGNOSIS — L821 Other seborrheic keratosis: Secondary | ICD-10-CM | POA: Diagnosis not present

## 2016-02-22 DIAGNOSIS — F329 Major depressive disorder, single episode, unspecified: Secondary | ICD-10-CM | POA: Diagnosis not present

## 2016-03-07 ENCOUNTER — Encounter: Payer: Self-pay | Admitting: Adult Health

## 2016-03-07 ENCOUNTER — Ambulatory Visit (INDEPENDENT_AMBULATORY_CARE_PROVIDER_SITE_OTHER): Payer: Medicare Other | Admitting: Adult Health

## 2016-03-07 ENCOUNTER — Telehealth: Payer: Self-pay | Admitting: Internal Medicine

## 2016-03-07 DIAGNOSIS — I2510 Atherosclerotic heart disease of native coronary artery without angina pectoris: Secondary | ICD-10-CM

## 2016-03-07 DIAGNOSIS — G4734 Idiopathic sleep related nonobstructive alveolar hypoventilation: Secondary | ICD-10-CM | POA: Diagnosis not present

## 2016-03-07 NOTE — Addendum Note (Signed)
Addended by: Osa Craver on: 03/07/2016 09:22 AM   Modules accepted: Orders

## 2016-03-07 NOTE — Assessment & Plan Note (Signed)
ONO per insurance requirements.   Plan  We are setting you up for an overnight oximetry test to requalify you for oxygen per your insurance requirements. The night of the test do not wear your oxygen.  Continue on Oxygen 1l/m At bedtime  .  follow up Dr. [name]  In 4 months and As needed

## 2016-03-07 NOTE — Progress Notes (Signed)
Subjective:    Patient ID: Eduardo Sox., male    DOB: 04/21/1946, 70 y.o.   MRN: [id number]  HPI 70 yo male never smoker followed for chronic cough and mild obstructive lung disease , nocturnal hypoxia on O2 at 2l/m followed by Dr. [name]   Hx of CAD   TEST  PFT in 07/2014 , demonstrating only minimal obstruction. walk Test-04/02/2015-he walked 555 feet starting with saturation 93%, falling to a nadir of 92% on room air with no oxygen desaturation. Pulse rate 100-109, regular. ONOX- 04/25/15- qualified for sleep O2 with 50 minutes<= 88% room air NPSG 09/29/15- WNL, AHI 3/ hr, with desat to 85%, mean sat 90.1%   03/07/2016 Follow up : Chronic cough /and Nocturnal hypoxia.  Patient returns for three-month follow-up. Pt states he needs to requalify for night time oxygen. Pt uses 1L at bedtime. Pt has no new complaints at this time. Says overall his breathing is at baseline. Denies any flare of cough or wheezing. An overnight oximetry test on room air will be ordered. Pneumovax vaccine is up-to-date September 2015. Discussed Prevnar 13 vaccine, he declines.  He denies chest pain, orthopnea, edema or fever.      Past Medical History:  Diagnosis Date  . Asthma    as a child  . Depression    sees Dr [name]  . History of BPH    Dr.Dahlstedt  . Hyperlipidemia   . Sleep disturbances    Current Outpatient Prescriptions on File Prior to Visit  Medication Sig Dispense Refill  . alfuzosin (UROXATRAL) 10 MG 24 hr tablet Take 10 mg by mouth daily.    Marland Kitchen aspirin 81 MG tablet Take 81 mg by mouth daily.    . B Complex Vitamins (B COMPLEX PO) Take 1 tablet by mouth daily.     Marland Kitchen CALCIUM PO Take 1 tablet by mouth daily.     . carvedilol (COREG) 3.125 MG tablet Take 3.125 mg by mouth daily. Takes 1/2 tablet by mouth daily    . Cholecalciferol (VITAMIN D3) 2000 UNITS TABS Take 2,000 Units by mouth daily.     . finasteride (PROSCAR) 5 MG tablet Take 1 tablet by mouth every other night    .  fluticasone (FLONASE) 50 MCG/ACT nasal spray Place 1 spray into both nostrils daily.     . hydrOXYzine (ATARAX/VISTARIL) 10 MG tablet Take 10 mg by mouth 3 (three) times daily as needed for anxiety.     Marland Kitchen KRILL OIL PO Take 500 mg by mouth daily.     Marland Kitchen LORazepam (ATIVAN) 1 MG tablet Take 1 mg by mouth at bedtime.    Marland Kitchen MAGNESIUM PO Take 1 tablet by mouth daily.      No current facility-administered medications on file prior to visit.       Review of Systems Constitutional:   No  weight loss, night sweats,  Fevers, chills, fatigue, or  lassitude.  HEENT:   No headaches,  Difficulty swallowing,  Tooth/dental problems, or  Sore throat,                No sneezing, itching, ear ache, nasal congestion, post nasal drip,   CV:  No chest pain,  Orthopnea, PND, swelling in lower extremities, anasarca, dizziness, palpitations, syncope.   GI  No heartburn, indigestion, abdominal pain, nausea, vomiting, diarrhea, change in bowel habits, loss of appetite, bloody stools.   Resp:    No excess mucus, no productive cough,  No non-productive cough,  No  coughing up of blood.  No change in color of mucus.  No wheezing.  No chest wall deformity  Skin: no rash or lesions.  GU: no dysuria, change in color of urine, no urgency or frequency.  No flank pain, no hematuria   MS:  No joint pain or swelling.  No decreased range of motion.  No back pain.  Psych:  No change in mood or affect. No depression or anxiety.  No memory loss.         Objective:   Physical Exam Vitals:   03/07/16 0858  BP: 118/76  Pulse: 88  Temp: 98.2 F (36.8 C)  TempSrc: Oral  SpO2: 93%  Weight: 208 lb (94.3 kg)  Height: 6\' 2"  (1.88 m)   GEN: A/Ox3; pleasant , NAD, well nourished    HEENT:  [place]/AT,  EACs-clear, TMs-wnl, NOSE-clear, THROAT-clear, no lesions, no postnasal drip or exudate noted.   NECK:  Supple w/ fair ROM; no JVD; normal carotid impulses w/o bruits; no thyromegaly or nodules palpated; no lymphadenopathy.      RESP  Clear  P & A; w/o, wheezes/ rales/ or rhonchi. no accessory muscle use, no dullness to percussion  CARD:  RRR, no m/r/g  , no peripheral edema, pulses intact, no cyanosis or clubbing.  GI:   Soft & nt; nml bowel sounds; no organomegaly or masses detected.   Musco: Warm bil, no deformities or joint swelling noted.   Neuro: alert, no focal deficits noted.    Skin: Warm, no lesions or rashes  Sharne Linders NP-C  [facility]  03/07/2016

## 2016-03-07 NOTE — Patient Instructions (Signed)
We are setting you up for an overnight oximetry test to requalify you for oxygen per your insurance requirements. The night of the test do not wear your oxygen.  Continue on Oxygen 1l/m At bedtime  .  follow up Dr. [name]  In 4 months and As needed

## 2016-03-08 NOTE — Telephone Encounter (Signed)
Appt scheduled - okay per patient. Nothing further needed.

## 2016-03-08 NOTE — Telephone Encounter (Signed)
Monday 07/10/16 at 11:15 (30 minute slot) can be used. Thanks.

## 2016-03-08 NOTE — Telephone Encounter (Signed)
Katie please advise where we can schedule pt. Thanks.

## 2016-03-15 ENCOUNTER — Encounter: Payer: Self-pay | Admitting: Adult Health

## 2016-04-06 ENCOUNTER — Telehealth: Payer: Self-pay | Admitting: Adult Health

## 2016-04-06 NOTE — Telephone Encounter (Signed)
Per verbal order from TP after reviewing ONO from 03/15/16 on RA  Positive desats Continue on oxygen   Called spoke with pt. Reviewed results and recs. Pt voiced understanding and had no further questions. Nothing further needed.

## 2016-04-13 DIAGNOSIS — N401 Enlarged prostate with lower urinary tract symptoms: Secondary | ICD-10-CM | POA: Diagnosis not present

## 2016-04-19 DIAGNOSIS — R3912 Poor urinary stream: Secondary | ICD-10-CM | POA: Diagnosis not present

## 2016-04-19 DIAGNOSIS — N5201 Erectile dysfunction due to arterial insufficiency: Secondary | ICD-10-CM | POA: Diagnosis not present

## 2016-04-19 DIAGNOSIS — N401 Enlarged prostate with lower urinary tract symptoms: Secondary | ICD-10-CM | POA: Diagnosis not present

## 2016-04-19 DIAGNOSIS — E291 Testicular hypofunction: Secondary | ICD-10-CM | POA: Diagnosis not present

## 2016-06-07 DIAGNOSIS — E782 Mixed hyperlipidemia: Secondary | ICD-10-CM | POA: Diagnosis not present

## 2016-06-07 DIAGNOSIS — Z0000 Encounter for general adult medical examination without abnormal findings: Secondary | ICD-10-CM | POA: Diagnosis not present

## 2016-06-07 DIAGNOSIS — I10 Essential (primary) hypertension: Secondary | ICD-10-CM | POA: Diagnosis not present

## 2016-06-07 DIAGNOSIS — E119 Type 2 diabetes mellitus without complications: Secondary | ICD-10-CM | POA: Diagnosis not present

## 2016-06-07 DIAGNOSIS — E559 Vitamin D deficiency, unspecified: Secondary | ICD-10-CM | POA: Diagnosis not present

## 2016-06-28 DIAGNOSIS — M5430 Sciatica, unspecified side: Secondary | ICD-10-CM | POA: Diagnosis not present

## 2016-07-10 ENCOUNTER — Encounter: Payer: Self-pay | Admitting: Internal Medicine

## 2016-07-10 ENCOUNTER — Ambulatory Visit (INDEPENDENT_AMBULATORY_CARE_PROVIDER_SITE_OTHER): Payer: Medicare Other | Admitting: Internal Medicine

## 2016-07-10 DIAGNOSIS — I2510 Atherosclerotic heart disease of native coronary artery without angina pectoris: Secondary | ICD-10-CM | POA: Diagnosis not present

## 2016-07-10 DIAGNOSIS — R0609 Other forms of dyspnea: Secondary | ICD-10-CM | POA: Diagnosis not present

## 2016-07-10 DIAGNOSIS — G4734 Idiopathic sleep related nonobstructive alveolar hypoventilation: Secondary | ICD-10-CM | POA: Diagnosis not present

## 2016-07-10 NOTE — Patient Instructions (Addendum)
Flu vax- senior   Please call as needed

## 2016-07-10 NOTE — Progress Notes (Signed)
HPI  male never smoker followed for cough, mild obstructive airways disease, thoracic deformity from old trauma, complicated by CAD, palpitations PFT 07/13/14- minimal obstructive airways disease, insignificant response to bronchodilator, normal lung volumes and diffusion. Walk Test-04/02/2015-he walked 555 feet starting with saturation 93%, falling to a nadir of 92% on room air with no oxygen desaturation. Pulse rate 100-109, regular. ONOX- 04/25/15- qualified for sleep O2 with 50 minutes<= 88% room air .NPSG 09/29/15- WNL, AHI 3/ hr, with desat to 85%, mean sat 90.1%  --------------------------------------------------------------------------------------------------  12/09/2015-70 year old male never smoker followed for cough, mild obstructive airways disease, complicated by CAD, palpitations O2 2L/ sleep/ [facility] FOLLOWS FOR: Review Sleep study with patient;pt would also like to discuss his O2 usage at night through [place]. NPSG 09/29/15- WNL, AHI 3/ hr, with desat to 85%, mean sat 90.1% He has been using oxygen for sleep. We compared results of overnight oximetry in September with polysomnogram in February. He denies significant cough or wheeze, or daytime dyspnea.  07/10/2016-70 year old male never smoker followed for cough, mild obstructive airways disease, thoracic deformity from old trauma, complicated by CAD, palpitations O2 1 L/sleep/[facility] FOLLOW FOR: 6 months follow-up,wakes up once during the night, Is on one liter of O2. Since flu vaccine always makes him sick. At today's visit he feels quite well with no routine cough or wheeze. Stable exercise tolerance. No acute events.  ROS-see HPI Constitutional:   No-   weight loss, night sweats, fevers, chills, fatigue, lassitude. HEENT:   No-  headaches, difficulty swallowing, tooth/dental problems, sore throat,       No-  sneezing, itching, ear ache, nasal congestion, post nasal drip,  CV:  No-   chest pain, orthopnea, PND, swelling in  lower extremities, anasarca,                              dizziness, +palpitations Resp: +shortness of breath with exertion or at rest.              No-   productive cough,  non-productive cough,  No- coughing up of blood.              No-   change in color of mucus.  No- wheezing.   Skin: No-   rash or lesions. GI:  No-   heartburn, indigestion, abdominal pain, nausea, vomiting,  GU: . MS:  No-   joint pain or swelling.   Neuro-     nothing unusual Psych:  No- change in mood or affect. + depression or anxiety.  No memory loss.  OBJ- Physical Exam    General- Alert, Oriented, Affect-appropriate, Distress- none acute Skin- rash-none, lesions- none, excoriation- none Lymphadenopathy- none Head- atraumatic            Eyes- Gross vision intact, PERRLA, conjunctivae and secretions clear            Ears- Hearing, canals-normal            Nose- Clear, no-Septal dev, mucus, polyps, erosion, perforation             Throat- Mallampati II/+ uvulopalatoplasty , mucosa clear , drainage- none, tonsils-  atrophic, + throat clearing Neck- flexible , trachea midline, no stridor , thyroid nl, carotid no bruit Chest - symmetrical excursion , unlabored           Heart/CV- RRR , no murmur , no gallop  , no rub, nl s1 s2                           -  JVD- none , edema- none, stasis changes- none, varices- none           Lung- clear to P&A, wheeze- none, cough- none , dullness-none, rub- none           Chest wall-+ heart monitor; + asymmetry chest wall at sternum consistent with old rib fractures and rib cage distortion. Abd-  Br/ Gen/ Rectal- Not done, not indicated Extrem- cyanosis- none, clubbing, none, atrophy- none, strength- nl Neuro- + right lower facial weakness

## 2016-07-14 DIAGNOSIS — M792 Neuralgia and neuritis, unspecified: Secondary | ICD-10-CM | POA: Diagnosis not present

## 2016-08-03 DIAGNOSIS — F329 Major depressive disorder, single episode, unspecified: Secondary | ICD-10-CM | POA: Diagnosis not present

## 2016-09-07 DIAGNOSIS — D485 Neoplasm of uncertain behavior of skin: Secondary | ICD-10-CM | POA: Diagnosis not present

## 2016-09-07 DIAGNOSIS — L821 Other seborrheic keratosis: Secondary | ICD-10-CM | POA: Diagnosis not present

## 2016-09-07 DIAGNOSIS — L820 Inflamed seborrheic keratosis: Secondary | ICD-10-CM | POA: Diagnosis not present

## 2016-09-07 DIAGNOSIS — L853 Xerosis cutis: Secondary | ICD-10-CM | POA: Diagnosis not present

## 2016-10-25 ENCOUNTER — Encounter: Payer: Self-pay | Admitting: Internal Medicine

## 2016-10-25 ENCOUNTER — Ambulatory Visit (INDEPENDENT_AMBULATORY_CARE_PROVIDER_SITE_OTHER): Payer: Medicare Other | Admitting: Internal Medicine

## 2016-10-25 VITALS — BP 128/66 | HR 79 | Ht 74.0 in | Wt 213.4 lb

## 2016-10-25 DIAGNOSIS — G4734 Idiopathic sleep related nonobstructive alveolar hypoventilation: Secondary | ICD-10-CM

## 2016-10-25 DIAGNOSIS — R0609 Other forms of dyspnea: Secondary | ICD-10-CM

## 2016-10-25 NOTE — Progress Notes (Signed)
HPI male never smoker followed for cough, mild obstructive airways disease, complicated by CAD, palpitations, old right rib fractures PFT 07/13/14- minimal obstructive airways disease, insignificant response to bronchodilator, normal lung volumes and diffusion. Echocardiogram 08/11/2013-normal LV function, normal valves, no evidence of prior MI Walk Test-04/02/2015-he walked 555 feet starting with saturation 93%, falling to a nadir of 92% on room air with no oxygen desaturation. Pulse rate 100-109, regular. ONOX- 04/25/15- qualified for sleep O2 with 50 minutes<= 88% room air .NPSG 09/29/15- WNL, AHI 3/ hr, with desat to 85%, mean sat 90.1% ----------------------------------------------------------------------  07/10/2016-71 year old male never smoker followed for cough, mild obstructive airways disease, complicated by CAD, palpitations O2 1 L/sleep/[facility] FOLLOW FOR: 6 months follow-up,wakes up once during the night, Is on one liter of O2.  10/25/2813-71 year old male never smoker followed for cough, mild obstructive airways disease, old right rib fractures complicated by CAD, palpitations O2 1 L/sleep/[facility] FOLLOWS FOR: Pt continues wear nocturnal O2 from [facility] and has concerns the machine is not working properly. Pt is having SOB as well. He admits he gets no exercise at all. Sometimes wakes at night and feels need to take a deep breath in. Sometimes during the day he is aware of dyspnea with unusual exertion. Still wears oxygen at night. It feels the same but machine is sounding different.  ROS-see HPI Constitutional:   No-   weight loss, night sweats, fevers, chills, fatigue, lassitude. HEENT:   No-  headaches, difficulty swallowing, tooth/dental problems, sore throat,       No-  sneezing, itching, ear ache, nasal congestion, post nasal drip,  CV:  No-   chest pain, orthopnea, PND, swelling in lower extremities, anasarca,                              dizziness, +palpitations Resp:  +shortness of breath with exertion or at rest.              No-   productive cough,  non-productive cough,  No- coughing up of blood.              No-   change in color of mucus.  No- wheezing.   Skin: No-   rash or lesions. GI:  No-   heartburn, indigestion, abdominal pain, nausea, vomiting,  GU: . MS:  No-   joint pain or swelling.   Neuro-     nothing unusual Psych:  No- change in mood or affect. + depression or anxiety.  No memory loss.  OBJ- Physical Exam    General- Alert, Oriented, Affect-appropriate, Distress- none acute Skin- rash-none, lesions- none, excoriation- none Lymphadenopathy- none Head- atraumatic            Eyes- Gross vision intact, PERRLA, conjunctivae and secretions clear            Ears- Hearing, canals-normal            Nose- Clear, no-Septal dev, mucus, polyps, erosion, perforation             Throat- Mallampati II/+ uvulopalatoplasty , mucosa clear , drainage- none, tonsils-  atrophic, + throat clearing Neck- flexible , trachea midline, no stridor , thyroid nl, carotid no bruit Chest - symmetrical excursion , unlabored           Heart/CV- RRR , no murmur , no gallop  , no rub, nl s1 s2                           -  JVD- none , edema- none, stasis changes- none, varices- none           Lung- clear to P&A, wheeze- none, cough- none , dullness-none, rub- none           Chest wall-+ heart monitor; + asymmetry chest wall at sternum consistent with old rib fractures and rib cage distortion. Abd-  Br/ Gen/ Rectal- Not done, not indicated Extrem- cyanosis- none, clubbing, none, atrophy- none, strength- nl Neuro- + right lower facial weakness

## 2016-10-25 NOTE — Assessment & Plan Note (Signed)
He agrees main issue is he gets no exercise at all. He is not anemic. We discussed various contributions to sense of dyspnea. Old right rib fractures may make his chest wall or little stiffer than normal but measured lung volumes were normal on PFT in 2015. Previous cardiology workup had also recommended more exercise. We discussed easily available exercises he could be doing to improve stamina.

## 2016-10-25 NOTE — Assessment & Plan Note (Signed)
He continues nocturnal oxygen. Appropriate for him to ask DME to check out his machine if there is any question.

## 2016-10-25 NOTE — Patient Instructions (Signed)
As discussed, the best thing for your shortness of breath will be to get more regular aerobic exercise- walking, jogging, bike riding, treadmill, swimming to regain some stamina

## 2016-11-07 NOTE — Progress Notes (Signed)
Patient ID: Eduardo Sox., male   DOB: 1945/12/30, 71 y.o.   MRN: [id number]     Cardiology Office Note   Date:  11/08/2016   ID:  Eduardo Sox., DOB 27-Jun-1946, MRN [id number]  PCP:  Melinda Crutch, MD    No chief complaint on file. f/u palpitations, CAD   Wt Readings from Last 3 Encounters:  11/08/16 210 lb (95.3 kg)  10/25/16 213 lb 6.4 oz (96.8 kg)  07/10/16 210 lb 9.6 oz (95.5 kg)       History of Present Illness: Eduardo Heymann. is a 71 y.o. male  who had a heart cath in 9/15. This showed moderate RCA disease. There was no significant obstructive disease. His ejection fraction was low normal. He had a pulmonary eval for cough. This is improved with less Coreg. He is concerned about AFib and stroke risk. He feels that he has daily palpitation. He hits his chest to help the palpitations resolve.  Prior w/u for AFib has been negative.  He had given up alcohol since the day of the cath in 2015 but restarted alcohol in 2017.     He has an iron overload syndrome as well. He has not started exercising regularly, even though his wife walks daily. He was mowing the yard in the summer months.   He is now on nighttime oxygen- but only uses it half the night, 2L/min.  He had another sleep study in 2016 and it was normal.  No need for it during the day. 94% during the day.  88% at night.  Prescribed by Dr. [name].  Since the last visit, his exercise frequency has not increased.  He is not doing much exercise.  He feels less stamina.    He has cut down drinking alcohol. He knows he needs to eliminate this. In the past, he reported drinking half a bottle per day.  THis has reduced since the last visit.   He tries to minimize sugar intake for his prediabetes.    Past Medical History:  Diagnosis Date  . Asthma    as a child  . Depression    sees Dr [name]  . History of BPH    Dr.Dahlstedt  . Hyperlipidemia   . Sleep disturbances     Past Surgical History:    Procedure Laterality Date  . LEFT HEART CATHETERIZATION WITH CORONARY ANGIOGRAM N/A 04/30/2014   Procedure: LEFT HEART CATHETERIZATION WITH CORONARY ANGIOGRAM;  Surgeon: [name], MD;  Location: [facility] CATH LAB;  Service: Cardiovascular;  Laterality: N/A;  . TONSILLECTOMY       Current Outpatient Prescriptions  Medication Sig Dispense Refill  . alfuzosin (UROXATRAL) 10 MG 24 hr tablet Take 10 mg by mouth daily.    . B Complex Vitamins (B COMPLEX PO) Take 1 tablet by mouth daily.     Marland Kitchen CALCIUM PO Take 1 tablet by mouth daily.     . carvedilol (COREG) 3.125 MG tablet Take 3.125 mg by mouth daily. Takes 1/2 tablet by mouth daily    . Cholecalciferol (VITAMIN D3) 5000 units CAPS Take 1 capsule by mouth daily.    . finasteride (PROSCAR) 5 MG tablet Take 1 tablet by mouth every other night    . fluticasone (FLONASE) 50 MCG/ACT nasal spray Place 1 spray into both nostrils daily.     . hydrOXYzine (ATARAX/VISTARIL) 10 MG tablet Take 10 mg by mouth 3 (three) times daily as needed for anxiety.     Marland Kitchen  KRILL OIL PO Take 500 mg by mouth daily.     Marland Kitchen LORazepam (ATIVAN) 1 MG tablet Take 1 mg by mouth at bedtime.    Marland Kitchen MAGNESIUM PO Take 1 tablet by mouth daily.     . Omega-3 Fatty Acids (FISH OIL) 1000 MG CAPS Take 1 capsule by mouth daily.     No current facility-administered medications for this visit.     Allergies:   Zocor [simvastatin] and Trazodone and nefazodone    Social History:  The patient  reports that he has never smoked. He has never used smokeless tobacco. He reports that he does not drink alcohol or use drugs.   Family History:  The patient's family history includes Cancer - Prostate in his father; Dementia in his mother; Hyperlipidemia in his sister; Stroke in his mother.    ROS:  Please see the history of present illness.   Otherwise, review of systems are positive for dizziness- improved with less Coreg. He is concerned about the side effects ativan.   All other systems are  reviewed and negative.    PHYSICAL EXAM: VS:  BP 118/72 (BP Location: Left Arm, Patient Position: Sitting, Cuff Size: Normal)   Pulse 83   Resp 20   Ht 6\' 2"  (1.88 m)   Wt 210 lb (95.3 kg)   BMI 26.96 kg/m  , BMI Body mass index is 26.96 kg/m. GEN: Well nourished, well developed, in no acute distress  HEENT: normal  Neck: no JVD, carotid bruits, or masses Cardiac: RRR; no murmurs, rubs, or gallops,no edema  Respiratory:  clear to auscultation bilaterally, normal work of breathing GI: soft, nontender, nondistended, + BS MS: no deformity or atrophy  Skin: warm and dry, no rash Neuro:  Strength and sensation are intact Psych: euthymic mood, full affect   ECG: NSR, T wave inversion less pronounced compared to prior.  Recent Labs: No results found for requested labs within last 8760 hours.   Lipid Panel No results found for: CHOL, TRIG, HDL, CHOLHDL, VLDL, LDLCALC, LDLDIRECT   Other studies Reviewed: Additional studies/ records that were reviewed today with results demonstrating: Monitor in 3/16 showed only symptomatic PACs and PVCs, no AFib.  2015 cath reviewed   ASSESSMENT AND PLAN:  1. CAD: Moderate disease noted on 9/15 cath- particularly in the RCA.  SHOB at baseline- controlled.  No chest pain. Continue risk factor modification. He will try to exercise more and eat healthy.  Goal as noted below.  Hopefully, his stamina will improve with more exercise as well. Cough is resolved with decreased Coreg. We'll not plan to go above 3.125 mg twice a day.  At this point, he is cutting the pills in half.  BP controlled.  He prefers the current dose and prescription.  He will start on an exerise bike.  WIll check with him on 6 months on how things are going. 2. Abnormal ECG: He has had persistent T-wave inversion from prior ECG.  His ejection fraction of 50-55% is within the normal range at last check in 2015.  No signs of CHF.  I did warn that excess alcohol can cause CHF. 3. Needs to  abstain from alcohol. This will be good for both his heart and his liver/iron storage issues.  We spoke about this at length.  He has already cut back.  Prediabetes now so more incentive to decrease sugar intake. 4. Cough: Resolved.   Current medicines are reviewed at length with the patient today.  The patient concerns  regarding his medicines were addressed.  The following changes have been made:  No change  Labs/ tests ordered today include:  No orders of the defined types were placed in this encounter.   Recommend 150 minutes/week of aerobic exercise Low fat, low carb, high fiber diet recommended  Disposition:   FU in 6 months  6 months Signed, Larae Grooms, MD  11/08/2016 8:27 AM    [facility] [place], [place], [place]  [address] Phone: [phone]; Fax: [phone]

## 2016-11-08 ENCOUNTER — Ambulatory Visit (INDEPENDENT_AMBULATORY_CARE_PROVIDER_SITE_OTHER): Payer: Medicare Other | Admitting: Interventional Cardiology

## 2016-11-08 ENCOUNTER — Encounter: Payer: Self-pay | Admitting: Interventional Cardiology

## 2016-11-08 VITALS — BP 118/72 | HR 83 | Resp 20 | Ht 74.0 in | Wt 210.0 lb

## 2016-11-08 DIAGNOSIS — R0609 Other forms of dyspnea: Secondary | ICD-10-CM

## 2016-11-08 DIAGNOSIS — I25119 Atherosclerotic heart disease of native coronary artery with unspecified angina pectoris: Secondary | ICD-10-CM

## 2016-11-08 DIAGNOSIS — R7303 Prediabetes: Secondary | ICD-10-CM | POA: Insufficient documentation

## 2016-11-08 DIAGNOSIS — R9431 Abnormal electrocardiogram [ECG] [EKG]: Secondary | ICD-10-CM

## 2016-11-08 DIAGNOSIS — Z789 Other specified health status: Secondary | ICD-10-CM

## 2016-11-08 DIAGNOSIS — Z7289 Other problems related to lifestyle: Secondary | ICD-10-CM

## 2016-11-08 DIAGNOSIS — I209 Angina pectoris, unspecified: Secondary | ICD-10-CM

## 2016-11-08 NOTE — Patient Instructions (Signed)

## 2016-12-06 DIAGNOSIS — E119 Type 2 diabetes mellitus without complications: Secondary | ICD-10-CM | POA: Diagnosis not present

## 2016-12-06 DIAGNOSIS — R05 Cough: Secondary | ICD-10-CM | POA: Diagnosis not present

## 2016-12-06 DIAGNOSIS — E782 Mixed hyperlipidemia: Secondary | ICD-10-CM | POA: Diagnosis not present

## 2016-12-06 DIAGNOSIS — R7309 Other abnormal glucose: Secondary | ICD-10-CM | POA: Diagnosis not present

## 2016-12-17 NOTE — Assessment & Plan Note (Signed)
Consider cardiopulmonary stress test

## 2016-12-17 NOTE — Assessment & Plan Note (Signed)
He has some rib cage deformity from old trauma and apparently hyperventilates enough and sleep to qualify for supplemental oxygen, even though he does not have obstructive sleep apnea or significant lung disease based on PFTs while awake. Plan-he agrees to Senior flu vaccine this year for trial. Appropriate education done. Continue oxygen 1 L for sleep

## 2017-01-18 DIAGNOSIS — F329 Major depressive disorder, single episode, unspecified: Secondary | ICD-10-CM | POA: Diagnosis not present

## 2017-03-09 DIAGNOSIS — L821 Other seborrheic keratosis: Secondary | ICD-10-CM | POA: Diagnosis not present

## 2017-03-09 DIAGNOSIS — I788 Other diseases of capillaries: Secondary | ICD-10-CM | POA: Diagnosis not present

## 2017-03-09 DIAGNOSIS — L570 Actinic keratosis: Secondary | ICD-10-CM | POA: Diagnosis not present

## 2017-03-09 DIAGNOSIS — D1801 Hemangioma of skin and subcutaneous tissue: Secondary | ICD-10-CM | POA: Diagnosis not present

## 2017-03-09 DIAGNOSIS — D692 Other nonthrombocytopenic purpura: Secondary | ICD-10-CM | POA: Diagnosis not present

## 2017-04-25 DIAGNOSIS — R3912 Poor urinary stream: Secondary | ICD-10-CM | POA: Diagnosis not present

## 2017-04-25 DIAGNOSIS — E291 Testicular hypofunction: Secondary | ICD-10-CM | POA: Diagnosis not present

## 2017-04-25 DIAGNOSIS — N401 Enlarged prostate with lower urinary tract symptoms: Secondary | ICD-10-CM | POA: Diagnosis not present

## 2017-05-23 NOTE — Progress Notes (Signed)
Cardiology Office Note   Date:  05/24/2017   ID:  Eduardo Sox., DOB 1946-06-08, MRN [id number]  PCP:  Eduardo Cruel, MD    No chief complaint on file. CAD   Wt Readings from Last 3 Encounters:  05/24/17 215 lb 3.2 oz (97.6 kg)  11/08/16 210 lb (95.3 kg)  10/25/16 213 lb 6.4 oz (96.8 kg)       History of Present Illness: Eduardo Sites. is a 71 y.o. male  who had a heart cath in 9/15. This showed moderate RCA disease. There was no significant obstructive disease. His ejection fraction was low normal. He had a pulmonary eval for cough. This is improved with less Coreg.   He was concerned in the past about AFib and stroke risk. He felt that he had daily palpitation. He hit his chest to help the palpitations resolve.  Prior w/u for AFib has been negative.  He had given up alcohol since the day of the cath in 2015 but restarted alcohol in 2017.     He has an iron overload syndrome as well.   He started nighttime oxygen in 2017- but only uses it half the night, 2L/min.  He had another sleep study in 2016 and it was normal.  No need for it during the day. 94% during the day.  88% at night.  Prescribed by Dr. Annamaria Watts.  Denies : Chest pain. Dizziness. Leg edema. Nitroglycerin use. Orthopnea. Palpitations. Paroxysmal nocturnal dyspnea. Shortness of breath. Syncope.   Since the last visit, He walks 5 days a week, 30 minutes at a time. He tolerates this activity quite well.  No cardiac sx.  He has decreased his alcohol intake to 2 glasses a day, down from a bottle a day.    He is inquisitive about diet changes.     Past Medical History:  Diagnosis Date  . Asthma    as a child  . Depression    sees Dr Eduardo Watts  . History of BPH    Dr.Dahlstedt  . Hyperlipidemia   . Sleep disturbances     Past Surgical History:  Procedure Laterality Date  . LEFT HEART CATHETERIZATION WITH CORONARY ANGIOGRAM N/A 04/30/2014   Procedure: LEFT HEART CATHETERIZATION WITH  CORONARY ANGIOGRAM;  Surgeon: Eduardo Booze, MD;  Location: [facility] CATH LAB;  Service: Cardiovascular;  Laterality: N/A;  . TONSILLECTOMY       Current Outpatient Prescriptions  Medication Sig Dispense Refill  . alfuzosin (UROXATRAL) 10 MG 24 hr tablet Take 10 mg by mouth daily.    . B Complex Vitamins (B COMPLEX PO) Take 1 tablet by mouth daily.     Marland Kitchen CALCIUM PO Take 1 tablet by mouth daily.     . carvedilol (COREG) 3.125 MG tablet Take 3.125 mg by mouth daily. Takes 1/2 tablet by mouth daily    . Cholecalciferol (VITAMIN D3) 5000 units CAPS Take 1 capsule by mouth daily.    Marland Kitchen ezetimibe (ZETIA) 10 MG tablet Take 10 mg by mouth daily.    . finasteride (PROSCAR) 5 MG tablet Take 1 tablet by mouth every other night    . fluticasone (FLONASE) 50 MCG/ACT nasal spray Place 1 spray into both nostrils daily.     . hydrOXYzine (ATARAX/VISTARIL) 10 MG tablet Take 10 mg by mouth 3 (three) times daily as needed for anxiety.     Marland Kitchen KRILL OIL PO Take 500 mg by mouth daily.     Marland Kitchen LORazepam (ATIVAN)  1 MG tablet Take 1 mg by mouth at bedtime.    Marland Kitchen MAGNESIUM PO Take 1 tablet by mouth daily.     . Omega-3 Fatty Acids (FISH OIL) 1000 MG CAPS Take 1 capsule by mouth daily.     No current facility-administered medications for this visit.     Allergies:   Zocor [simvastatin] and Trazodone and nefazodone    Social History:  The patient  reports that he has never smoked. He has never used smokeless tobacco. He reports that he does not drink alcohol or use drugs.   Family History:  The patient's family history includes Cancer - Prostate in his father; Dementia in his mother; Hyperlipidemia in his sister; Stroke in his mother.    ROS:  Please see the history of present illness.   Otherwise, review of systems are positive for itching on feet.   All other systems are reviewed and negative.    PHYSICAL EXAM: VS:  BP 130/72   Pulse 84   Ht 6\' 2"  (1.88 m)   Wt 215 lb 3.2 oz (97.6 kg)   SpO2 95%   BMI  27.63 kg/m  , BMI Body mass index is 27.63 kg/m. GEN: Well nourished, well developed, in no acute distress  HEENT: normal  Neck: no JVD, carotid bruits, or masses Cardiac: RRR; occasional premature beats, no murmurs, rubs, or gallops,no edema  Respiratory:  clear to auscultation bilaterally, normal work of breathing GI: soft, nontender, nondistended, + BS MS: no deformity or atrophy , 2+ PT pulses bilaterally Skin: warm and dry, no rash Neuro:  Strength and sensation are intact Psych: euthymic mood, full affect    Recent Labs: No results found for requested labs within last 8760 hours.   Lipid Panel No results found for: CHOL, TRIG, HDL, CHOLHDL, VLDL, LDLCALC, LDLDIRECT   Other studies Reviewed: Additional studies/ records that were reviewed today with results demonstrating: LDL 174 in 11/17.   ASSESSMENT AND PLAN:  1. CAD: Moderate RCA disease in the past on 9/15 cath.  Do not increase Coreg > 3.125 mg BID. Heart healthy diet info given.  2. Abnormal ECG: Chronic T wave changes noted on prior ECG. 3. Needs to abstain from alcohol use as well.  He has cut back.   4. Hyperlipidemia: He is taking Zetia.  Check lipids today.  5. Tolerating Coreg.   6. Regular dose flu vaccine given per his request.     Current medicines are reviewed at length with the patient today.  The patient concerns regarding his medicines were addressed.  The following changes have been made:  No change  Labs/ tests ordered today include:   Orders Placed This Encounter  Procedures  . Flu Vaccine QUAD 36+ mos IM    Recommend 150 minutes/week of aerobic exercise Low fat, low carb, high fiber diet recommended  Disposition:   FU in 6 months   Signed, Eduardo Grooms, MD  05/24/2017 8:23 AM    [facility] [place], [place], [place]  [address] Phone: [phone]; Fax: [phone]

## 2017-05-24 ENCOUNTER — Encounter: Payer: Self-pay | Admitting: Interventional Cardiology

## 2017-05-24 ENCOUNTER — Ambulatory Visit (INDEPENDENT_AMBULATORY_CARE_PROVIDER_SITE_OTHER): Payer: Medicare Other | Admitting: Interventional Cardiology

## 2017-05-24 ENCOUNTER — Encounter (INDEPENDENT_AMBULATORY_CARE_PROVIDER_SITE_OTHER): Payer: Self-pay

## 2017-05-24 VITALS — BP 130/72 | HR 84 | Ht 74.0 in | Wt 215.2 lb

## 2017-05-24 DIAGNOSIS — Z23 Encounter for immunization: Secondary | ICD-10-CM

## 2017-05-24 DIAGNOSIS — E782 Mixed hyperlipidemia: Secondary | ICD-10-CM | POA: Diagnosis not present

## 2017-05-24 DIAGNOSIS — I25119 Atherosclerotic heart disease of native coronary artery with unspecified angina pectoris: Secondary | ICD-10-CM | POA: Diagnosis not present

## 2017-05-24 DIAGNOSIS — R9431 Abnormal electrocardiogram [ECG] [EKG]: Secondary | ICD-10-CM

## 2017-05-24 DIAGNOSIS — R0609 Other forms of dyspnea: Secondary | ICD-10-CM

## 2017-05-24 DIAGNOSIS — I209 Angina pectoris, unspecified: Secondary | ICD-10-CM | POA: Diagnosis not present

## 2017-05-24 DIAGNOSIS — Z789 Other specified health status: Secondary | ICD-10-CM

## 2017-05-24 DIAGNOSIS — Z7289 Other problems related to lifestyle: Secondary | ICD-10-CM

## 2017-05-24 LAB — COMPREHENSIVE METABOLIC PANEL
ALBUMIN: 4.5 g/dL (ref 3.5–4.8)
ALK PHOS: 75 IU/L (ref 39–117)
ALT: 71 IU/L — AB (ref 0–44)
AST: 81 IU/L — ABNORMAL HIGH (ref 0–40)
Albumin/Globulin Ratio: 1.9 (ref 1.2–2.2)
BILIRUBIN TOTAL: 1.1 mg/dL (ref 0.0–1.2)
BUN / CREAT RATIO: 13 (ref 10–24)
BUN: 11 mg/dL (ref 8–27)
CHLORIDE: 97 mmol/L (ref 96–106)
CO2: 18 mmol/L — AB (ref 20–29)
Calcium: 9.3 mg/dL (ref 8.6–10.2)
Creatinine, Ser: 0.86 mg/dL (ref 0.76–1.27)
GFR calc Af Amer: 99 mL/min/{1.73_m2} (ref >59)
GFR calc non Af Amer: 86 mL/min/{1.73_m2} (ref >59)
GLOBULIN, TOTAL: 2.4 g/dL (ref 1.5–4.5)
Glucose: 148 mg/dL — ABNORMAL HIGH (ref 65–99)
Potassium: 4.6 mmol/L (ref 3.5–5.2)
Sodium: 136 mmol/L (ref 134–144)
Total Protein: 6.9 g/dL (ref 6.0–8.5)

## 2017-05-24 LAB — LIPID PANEL
CHOLESTEROL TOTAL: 224 mg/dL — AB (ref 100–199)
Chol/HDL Ratio: 3.9 ratio (ref 0.0–5.0)
HDL: 57 mg/dL (ref >39)
LDL Calculated: 139 mg/dL — ABNORMAL HIGH (ref 0–99)
Triglycerides: 141 mg/dL (ref 0–149)
VLDL CHOLESTEROL CAL: 28 mg/dL (ref 5–40)

## 2017-05-24 NOTE — Patient Instructions (Signed)
Medication Instructions:  Your physician recommends that you continue on your current medications as directed. Please refer to the Current Medication list given to you today.   Labwork: Labs today: Fasting lipid and Complete metabolic panel   Testing/Procedures: None ordered  Follow-Up: Your physician wants you to follow-up in: 6 months with Dr. Irish Lack. You will receive a reminder letter in the mail two months in advance. If you don't receive a letter, please call our office to schedule the follow-up appointment.   Any Other Special Instructions Will Be Listed Below (If Applicable).   Heart-Healthy Eating Plan Heart-healthy meal planning includes:  Limiting unhealthy fats.  Increasing healthy fats.  Making other small dietary changes.  You may need to talk with your doctor or a diet specialist (dietitian) to create an eating plan that is right for you. What types of fat should I choose?  Choose healthy fats. These include olive oil and canola oil, flaxseeds, walnuts, almonds, and seeds.  Eat more omega-3 fats. These include salmon, mackerel, sardines, tuna, flaxseed oil, and ground flaxseeds. Try to eat fish at least twice each week.  Limit saturated fats. ? Saturated fats are often found in animal products, such as meats, butter, and cream. ? Plant sources of saturated fats include palm oil, palm kernel oil, and coconut oil.  Avoid foods with partially hydrogenated oils in them. These include stick margarine, some tub margarines, cookies, crackers, and other baked goods. These contain trans fats. What general guidelines do I need to follow?  Check food labels carefully. Identify foods with trans fats or high amounts of saturated fat.  Fill one half of your plate with vegetables and green salads. Eat 4-5 servings of vegetables per day. A serving of vegetables is: ? 1 cup of raw leafy vegetables. ?  cup of raw or cooked cut-up vegetables. ?  cup of vegetable  juice.  Fill one fourth of your plate with whole grains. Look for the word "whole" as the first word in the ingredient list.  Fill one fourth of your plate with lean protein foods.  Eat 4-5 servings of fruit per day. A serving of fruit is: ? One medium whole fruit. ?  cup of dried fruit. ?  cup of fresh, frozen, or canned fruit. ?  cup of 100% fruit juice.  Eat more foods that contain soluble fiber. These include apples, broccoli, carrots, beans, peas, and barley. Try to get 20-30 g of fiber per day.  Eat more home-cooked food. Eat less restaurant, buffet, and fast food.  Limit or avoid alcohol.  Limit foods high in starch and sugar.  Avoid fried foods.  Avoid frying your food. Try baking, boiling, grilling, or broiling it instead. You can also reduce fat by: ? Removing the skin from poultry. ? Removing all visible fats from meats. ? Skimming the fat off of stews, soups, and gravies before serving them. ? Steaming vegetables in water or broth.  Lose weight if you are overweight.  Eat 4-5 servings of nuts, legumes, and seeds per week: ? One serving of dried beans or legumes equals  cup after being cooked. ? One serving of nuts equals 1 ounces. ? One serving of seeds equals  ounce or one tablespoon.  You may need to keep track of how much salt or sodium you eat. This is especially true if you have high blood pressure. Talk with your doctor or dietitian to get more information. What foods can I eat? Grains Breads, including [place], white, pita,  wheat, raisin, rye, oatmeal, and [place]. Tortillas that are neither fried nor made with lard or trans fat. Low-fat rolls, including hotdog and hamburger buns and English muffins. Biscuits. Muffins. Waffles. Pancakes. Light popcorn. Whole-grain cereals. Flatbread. Melba toast. Pretzels. Breadsticks. Rusks. Low-fat snacks. Low-fat crackers, including oyster, saltine, matzo, graham, animal, and rye. Rice and pasta, including brown rice  and pastas that are made with whole wheat. Vegetables All vegetables. Fruits All fruits, but limit coconut. Meats and Other Protein Sources Lean, well-trimmed beef, veal, pork, and lamb. Chicken and [place] without skin. All fish and shellfish. Wild duck, rabbit, pheasant, and venison. Egg whites or low-cholesterol egg substitutes. Dried beans, peas, lentils, and tofu. Seeds and most nuts. Dairy Low-fat or nonfat cheeses, including ricotta, string, and mozzarella. Skim or 1% milk that is liquid, powdered, or evaporated. Buttermilk that is made with low-fat milk. Nonfat or low-fat yogurt. Beverages Mineral water. Diet carbonated beverages. Sweets and Desserts Sherbets and fruit ices. Honey, jam, marmalade, jelly, and syrups. Meringues and gelatins. Pure sugar candy, such as hard candy, jelly beans, gumdrops, mints, marshmallows, and small amounts of dark chocolate. [employer] Eat all sweets and desserts in moderation. Fats and Oils Nonhydrogenated (trans-free) margarines. Vegetable oils, including soybean, sesame, sunflower, olive, peanut, safflower, corn, canola, and cottonseed. Salad dressings or mayonnaise made with a vegetable oil. Limit added fats and oils that you use for cooking, baking, salads, and as spreads. Other Cocoa powder. Coffee and tea. All seasonings and condiments. The items listed above may not be a complete list of recommended foods or beverages. Contact your dietitian for more options. What foods are not recommended? Grains Breads that are made with saturated or trans fats, oils, or whole milk. Croissants. Butter rolls. Cheese breads. Sweet rolls. Donuts. Buttered popcorn. Chow mein noodles. High-fat crackers, such as cheese or butter crackers. Meats and Other Protein Sources Fatty meats, such as hotdogs, short ribs, sausage, spareribs, bacon, rib eye roast or steak, and mutton. High-fat deli meats, such as salami and bologna. Caviar. Domestic duck and goose. Organ  meats, such as kidney, liver, sweetbreads, and heart. Dairy Cream, sour cream, cream cheese, and creamed cottage cheese. Whole-milk cheeses, including blue (bleu), [place], [place], [place], American, [place], Swiss, cheddar, [place], and [place]. Whole or 2% milk that is liquid, evaporated, or condensed. Whole buttermilk. Cream sauce or high-fat cheese sauce. Yogurt that is made from whole milk. Beverages Regular sodas and juice drinks with added sugar. Sweets and Desserts Frosting. Pudding. Cookies. Cakes other than angel food cake. Candy that has milk chocolate or white chocolate, hydrogenated fat, butter, coconut, or unknown ingredients. Buttered syrups. Full-fat ice cream or ice cream drinks. Fats and Oils Gravy that has suet, meat fat, or shortening. Cocoa butter, hydrogenated oils, palm oil, coconut oil, palm kernel oil. These can often be found in baked products, candy, fried foods, nondairy creamers, and whipped toppings. Solid fats and shortenings, including bacon fat, salt pork, lard, and butter. Nondairy cream substitutes, such as coffee creamers and sour cream substitutes. Salad dressings that are made of unknown oils, cheese, or sour cream. The items listed above may not be a complete list of foods and beverages to avoid. Contact your dietitian for more information. This information is not intended to replace advice given to you by your health care provider. Make sure you discuss any questions you have with your health care provider. Document Released: 01/23/2012 Document Revised: 12/30/2015 Document Reviewed: 01/15/2014 Elsevier Interactive Patient Education  [employer].  If you need a refill on your cardiac medications before your next appointment, please call your pharmacy.

## 2017-05-30 ENCOUNTER — Telehealth: Payer: Self-pay | Admitting: Interventional Cardiology

## 2017-05-30 NOTE — Telephone Encounter (Signed)
New message   Patient calling for lab results, please call on mobile.

## 2017-05-30 NOTE — Telephone Encounter (Signed)
Left message for patient to call back  

## 2017-05-31 NOTE — Telephone Encounter (Signed)
Follow up ° ° ° °Patient calling for lab results. Please call °

## 2017-06-01 NOTE — Telephone Encounter (Signed)
Attempted to call patient. There was no answer.

## 2017-06-06 MED ORDER — EZETIMIBE 10 MG PO TABS
10.0000 mg | ORAL_TABLET | Freq: Every day | ORAL | 11 refills | Status: DC
Start: 2017-06-06 — End: 2023-08-23

## 2017-06-06 NOTE — Telephone Encounter (Signed)
-----   Message from [name], MD sent at 05/25/2017 10:16 AM EDT ----- LFTs stable.  Electrolytes are ok.  LDL above target. Please ensure he is taking ezetemibe.

## 2017-06-06 NOTE — Telephone Encounter (Signed)
Called and spoke to patient. Reviewed results with the patient again. Patient states that he is out of his zetia now and needs a refill, but states that he was taking it for the lab draw. Refill for his zetia sent to his preferred pharmacy. Made patient aware that I would let Dr. Irish Lack know that he said that he was taking zetia when he had his labs drawn. Made patient aware that I would be calling back after Dr. Irish Lack reviews this message with additional recommendations. Patient verbalized understanding.

## 2017-06-11 ENCOUNTER — Other Ambulatory Visit: Payer: Self-pay

## 2017-06-11 DIAGNOSIS — E785 Hyperlipidemia, unspecified: Secondary | ICD-10-CM

## 2017-06-13 ENCOUNTER — Telehealth: Payer: Self-pay | Admitting: Interventional Cardiology

## 2017-06-13 NOTE — Telephone Encounter (Signed)
Notes recorded by Drue Novel I, RN on 06/13/2017 at 9:07 AM EST Returned call to patient and made him aware that his pharmacy states that he had already picked up an Rx for zetia on 10/29 and that is why he could not pick up another Rx. Patient now states that he was confused and that he already has the zetia and has been taking it. Patient requesting a copy of lab results be mailed to him. Address confirmed. Results mailed per patient's request. ------  Notes recorded by Cleon Gustin, RN on 06/11/2017 at 6:18 PM EST Left message for patient to call back. ------  Notes recorded by Cleon Gustin, RN on 06/11/2017 at 6:07 PM EST Patient stated that his zetia was not ready at the pharmacy. Verified correct pharmacy Engineer, [facility] on [employer] [address] [employer] Called patient's pharmacy who states that the patient already picked up his zetia on 06/04/17. Will call and make the patient aware. ------  Notes recorded by Cleon Gustin, RN on 06/11/2017 at 4:02 PM EST Called and made patient aware of recommendations. Labs ordered and scheduled for 09/11/17. Patient verbalized understanding. ------  Notes recorded by [name], MD on 06/10/2017 at 6:14 PM EST Repeat liver and lipids in 3 months. ------  Notes recorded by Cleon Gustin, RN on 06/06/2017 at 10:53 AM EDT Called and spoke to patient. Reviewed results with the patient again. Patient states that he is out of his zetia now and needs a refill, but states that he was taking it for the lab draw. Refill for his zetia sent to his preferred pharmacy. Made patient aware that I would let Dr. Irish Lack know that he said that he was taking zetia when he had his labs drawn. Made patient aware that I would be calling back after Dr. Irish Lack reviews this message with additional recommendations. Patient verbalized understanding. ------  Notes recorded by Cleon Gustin, RN on 05/30/2017 at 11:50 AM EDT Left message for patient to call  back. ------  Notes recorded by Drue Novel I, RN on 05/28/2017 at 11:01 AM EDT Attempted to call the patient back and the phone keeps ringing busy. Will try again later. ------  Notes recorded by Cleon Gustin, RN on 05/28/2017 at 11:00 AM EDT Called to make patient aware of results. Made patient aware that his LDL is above target at 139 and asked if he was taking zetia. Patient states that he is not taking zetia and does not know what I am talking about. Patient made aware that he is suppose to be taking zetia to help with his cholesterol and that according to our charts the most recent Rx was sent to [facility] on [address] [facility] [place]. on 9/27. Patient states that he does not know what I am talking about and is asking to speak to Nevin Bloodgood, that he talked to a Nevin Bloodgood the other day. Made patient aware that I did not know who Nevin Bloodgood was. Advised patient to start taking zetia to help with his cholesterol and then we would recheck his labs to see if there is improvement. Patient states that this does not make sense to him and hung up on me. ------  Notes recorded by [name],  LFTs stable. Electrolytes are ok. LDL above target. Please ensure he is taking ezetemibe.

## 2017-06-13 NOTE — Telephone Encounter (Signed)
New message    Patient is returning [place] call

## 2017-07-05 DIAGNOSIS — F329 Major depressive disorder, single episode, unspecified: Secondary | ICD-10-CM | POA: Diagnosis not present

## 2017-07-10 ENCOUNTER — Ambulatory Visit: Payer: Medicare Other | Admitting: Internal Medicine

## 2017-08-10 DIAGNOSIS — E559 Vitamin D deficiency, unspecified: Secondary | ICD-10-CM | POA: Diagnosis not present

## 2017-08-10 DIAGNOSIS — R7989 Other specified abnormal findings of blood chemistry: Secondary | ICD-10-CM | POA: Diagnosis not present

## 2017-08-10 DIAGNOSIS — E119 Type 2 diabetes mellitus without complications: Secondary | ICD-10-CM | POA: Diagnosis not present

## 2017-08-10 DIAGNOSIS — Z0000 Encounter for general adult medical examination without abnormal findings: Secondary | ICD-10-CM | POA: Diagnosis not present

## 2017-08-10 DIAGNOSIS — R1012 Left upper quadrant pain: Secondary | ICD-10-CM | POA: Diagnosis not present

## 2017-08-10 DIAGNOSIS — E782 Mixed hyperlipidemia: Secondary | ICD-10-CM | POA: Diagnosis not present

## 2017-08-10 DIAGNOSIS — I10 Essential (primary) hypertension: Secondary | ICD-10-CM | POA: Diagnosis not present

## 2017-09-03 NOTE — Progress Notes (Deleted)
Cardiology Office Note   Date:  09/03/2017   ID:  Eduardo Lazo., DOB [birth year], MRN [id number]  PCP:  [name], MD    No chief complaint on file.    Wt Readings from Last 3 Encounters:  05/24/17 215 lb 3.2 oz (97.6 kg)  11/08/16 210 lb (95.3 kg)  10/25/16 213 lb 6.4 oz (96.8 kg)       History of Present Illness: Eduardo Polo. is a 72 y.o. male   who had a heart cath in 9/15. This showed moderate RCA disease. There was no significant obstructive disease. His ejection fraction was low normal. He had a pulmonary eval for cough. This is improved with less Coreg.   He was concerned in the past about AFib and stroke risk. He felt that he had daily palpitation. He hit his chest to help the palpitations resolve. Prior w/u for AFib has been negative.  He had given up alcohol since the day of the cath in 2015 but restarted alcohol in 2017.   He has an iron overload syndrome as well.   He started nighttime oxygen in 2017- but only uses it half the night, 2L/min. He had another sleep study in 2016 and it was normal. No need for it during the day. 94% during the day. 88% at night. Prescribed by Dr. [name].  He was walking more at his last visit.  He decreased EtOH intake and was looking to change his diet.    Past Medical History:  Diagnosis Date  . Asthma    as a child  . Depression    sees Dr [name]  . History of BPH    Dr.Dahlstedt  . Hyperlipidemia   . Sleep disturbances     Past Surgical History:  Procedure Laterality Date  . LEFT HEART CATHETERIZATION WITH CORONARY ANGIOGRAM N/A 04/30/2014   Procedure: LEFT HEART CATHETERIZATION WITH CORONARY ANGIOGRAM;  Surgeon: [name], MD;  Location: [facility] CATH LAB;  Service: Cardiovascular;  Laterality: N/A;  . TONSILLECTOMY       Current Outpatient Medications  Medication Sig Dispense Refill  . alfuzosin (UROXATRAL) 10 MG 24 hr tablet Take 10 mg by mouth daily.    . B Complex  Vitamins (B COMPLEX PO) Take 1 tablet by mouth daily.     Marland Kitchen CALCIUM PO Take 1 tablet by mouth daily.     . carvedilol (COREG) 3.125 MG tablet Take 3.125 mg by mouth daily. Takes 1/2 tablet by mouth daily    . Cholecalciferol (VITAMIN D3) 5000 units CAPS Take 1 capsule by mouth daily.    Marland Kitchen ezetimibe (ZETIA) 10 MG tablet Take 1 tablet (10 mg total) by mouth daily. 30 tablet 11  . finasteride (PROSCAR) 5 MG tablet Take 1 tablet by mouth every other night    . fluticasone (FLONASE) 50 MCG/ACT nasal spray Place 1 spray into both nostrils daily.     . hydrOXYzine (ATARAX/VISTARIL) 10 MG tablet Take 10 mg by mouth 3 (three) times daily as needed for anxiety.     Marland Kitchen KRILL OIL PO Take 500 mg by mouth daily.     Marland Kitchen LORazepam (ATIVAN) 1 MG tablet Take 1 mg by mouth at bedtime.    Marland Kitchen MAGNESIUM PO Take 1 tablet by mouth daily.     . Omega-3 Fatty Acids (FISH OIL) 1000 MG CAPS Take 1 capsule by mouth daily.     No current facility-administered medications for this visit.  Allergies:   Zocor [simvastatin] and Trazodone and nefazodone    Social History:  The patient  reports that  has never smoked. he has never used smokeless tobacco. He reports that he does not drink alcohol or use drugs.   Family History:  The patient's ***family history includes Cancer - Prostate in his father; Dementia in his mother; Hyperlipidemia in his sister; Stroke in his mother.    ROS:  Please see the history of present illness.   Otherwise, review of systems are positive for ***.   All other systems are reviewed and negative.    PHYSICAL EXAM: VS:  There were no vitals taken for this visit. , BMI There is no height or weight on file to calculate BMI. GEN: Well nourished, well developed, in no acute distress  HEENT: normal  Neck: no JVD, carotid bruits, or masses Cardiac: ***RRR; no murmurs, rubs, or gallops,no edema  Respiratory:  clear to auscultation bilaterally, normal work of breathing GI: soft, nontender,  nondistended, + BS MS: no deformity or atrophy  Skin: warm and dry, no rash Neuro:  Strength and sensation are intact Psych: euthymic mood, full affect   EKG:   The ekg ordered today demonstrates ***   Recent Labs: 05/24/2017: ALT 71; BUN 11; Creatinine, Ser 0.86; Potassium 4.6; Sodium 136   Lipid Panel    Component Value Date/Time   CHOL 224 (H) 05/24/2017 0845   TRIG 141 05/24/2017 0845   HDL 57 05/24/2017 0845   CHOLHDL 3.9 05/24/2017 0845   LDLCALC 139 (H) 05/24/2017 0845     Other studies Reviewed: Additional studies/ records that were reviewed today with results demonstrating: ***.   ASSESSMENT AND PLAN:  1. CAD: 2. Abnormal ECG: 3. Hyperlipidemia: 4. Abstain from alcohol.   Current medicines are reviewed at length with the patient today.  The patient concerns regarding his medicines were addressed.  The following changes have been made:  No change***  Labs/ tests ordered today include: *** No orders of the defined types were placed in this encounter.   Recommend 150 minutes/week of aerobic exercise Low fat, low carb, high fiber diet recommended  Disposition:   FU in ***   Signed, Larae Grooms, MD  09/03/2017 6:50 PM    [facility] [address], [place], [place]  [address] Phone: [phone]; Fax: [phone]

## 2017-09-06 ENCOUNTER — Ambulatory Visit: Payer: No Typology Code available for payment source | Admitting: Interventional Cardiology

## 2017-09-11 ENCOUNTER — Other Ambulatory Visit: Payer: Medicare Other

## 2017-09-11 DIAGNOSIS — D485 Neoplasm of uncertain behavior of skin: Secondary | ICD-10-CM | POA: Diagnosis not present

## 2017-09-11 DIAGNOSIS — D2271 Melanocytic nevi of right lower limb, including hip: Secondary | ICD-10-CM | POA: Diagnosis not present

## 2017-09-11 DIAGNOSIS — I788 Other diseases of capillaries: Secondary | ICD-10-CM | POA: Diagnosis not present

## 2017-09-11 DIAGNOSIS — B078 Other viral warts: Secondary | ICD-10-CM | POA: Diagnosis not present

## 2017-09-11 DIAGNOSIS — D225 Melanocytic nevi of trunk: Secondary | ICD-10-CM | POA: Diagnosis not present

## 2017-09-11 DIAGNOSIS — L812 Freckles: Secondary | ICD-10-CM | POA: Diagnosis not present

## 2017-09-11 DIAGNOSIS — D1801 Hemangioma of skin and subcutaneous tissue: Secondary | ICD-10-CM | POA: Diagnosis not present

## 2017-09-11 DIAGNOSIS — L821 Other seborrheic keratosis: Secondary | ICD-10-CM | POA: Diagnosis not present

## 2017-10-09 NOTE — Progress Notes (Signed)
Cardiology Office Note   Date:  10/10/2017   ID:  Eduardo Watts., DOB 06-01-1946, MRN [id number]  PCP:  [name], MD    No chief complaint on file.  CAD  Wt Readings from Last 3 Encounters:  10/10/17 213 lb 3.2 oz (96.7 kg)  05/24/17 215 lb 3.2 oz (97.6 kg)  11/08/16 210 lb (95.3 kg)       History of Present Illness: Eduardo Watts. is a 72 y.o. male  who had a heart cath in 9/15. This showed moderate RCA disease. There was no significant obstructive disease. His ejection fraction was low normal. He had a pulmonary eval for cough. This is improved with less Coreg.   He was concerned in the past about AFib and stroke risk. He felt that he had daily palpitation. He hit his chest to help the palpitations resolve. Prior w/u for AFib has been negative.  He had given up alcohol since the day of the cath in 2015 but restarted alcohol in 2017.   He has an iron overload syndrome as well.   He started nighttime oxygen in 2017- but only uses it half the night, 2L/min. He had another sleep study in 2016 and it was normal. No need for it during the day. 94% during the day. 88% at night. Prescribed by Dr. [name].  He has decreased his alcohol intake to 2 glasses a day, down from a bottle a day.  He has maintained this level.  He is working with Dr. [name] to decrease his cravings for alcohol.  Acampro was started for this.  He is not exercising much.  He feels some SHOB wen he wakes from a bad dream.   Denies : Chest pain. Dizziness. Leg edema. Nitroglycerin use. Orthopnea. Palpitations. Paroxysmal nocturnal dyspnea. Shortness of breath. Syncope.   Past Medical History:  Diagnosis Date  . Asthma    as a child  . Depression    sees Dr [name]  . History of BPH    Dr.Dahlstedt  . Hyperlipidemia   . Sleep disturbances     Past Surgical History:  Procedure Laterality Date  . LEFT HEART CATHETERIZATION WITH CORONARY ANGIOGRAM N/A 04/30/2014   Procedure: LEFT HEART CATHETERIZATION WITH CORONARY ANGIOGRAM;  Surgeon: [name], MD;  Location: [facility] CATH LAB;  Service: Cardiovascular;  Laterality: N/A;  . TONSILLECTOMY       Current Outpatient Medications  Medication Sig Dispense Refill  . acamprosate (CAMPRAL) 333 MG tablet Take 2 tablets by mouth daily.    Marland Kitchen alfuzosin (UROXATRAL) 10 MG 24 hr tablet Take 10 mg by mouth daily.    . B Complex Vitamins (B COMPLEX PO) Take 1 tablet by mouth daily.     Marland Kitchen CALCIUM PO Take 1 tablet by mouth daily.     . carvedilol (COREG) 3.125 MG tablet Take 3.125 mg by mouth daily. Takes 1/2 tablet by mouth daily    . Cholecalciferol (VITAMIN D3) 5000 units CAPS Take 1 capsule by mouth daily.    Marland Kitchen ezetimibe (ZETIA) 10 MG tablet Take 1 tablet (10 mg total) by mouth daily. 30 tablet 11  . finasteride (PROSCAR) 5 MG tablet Take 1 tablet by mouth every other night    . fluticasone (FLONASE) 50 MCG/ACT nasal spray Place 1 spray into both nostrils daily.     . hydrOXYzine (ATARAX/VISTARIL) 10 MG tablet Take 10 mg by mouth 3 (three) times daily as needed for anxiety.     Marland Kitchen  KRILL OIL PO Take 500 mg by mouth daily.     Marland Kitchen LORazepam (ATIVAN) 1 MG tablet Take 1 mg by mouth at bedtime.    Marland Kitchen MAGNESIUM PO Take 1 tablet by mouth daily.     . Omega-3 Fatty Acids (FISH OIL) 1000 MG CAPS Take 1 capsule by mouth daily.    . rosuvastatin (CRESTOR) 5 MG tablet Take 5 mg by mouth every other day.     No current facility-administered medications for this visit.     Allergies:   Zocor [simvastatin] and Trazodone and nefazodone    Social History:  The patient  reports that  has never smoked. he has never used smokeless tobacco. He reports that he does not drink alcohol or use drugs.   Family History:  The patient's family history includes Cancer - Prostate in his father; Dementia in his mother; Hyperlipidemia in his sister; Stroke in his mother.    ROS:  Please see the history of present illness.   Otherwise,  review of systems are positive for .   All other systems are reviewed and negative.    PHYSICAL EXAM: VS:  BP (!) 142/78   Pulse 95   Ht 6\' 2"  (1.88 m)   Wt 213 lb 3.2 oz (96.7 kg)   SpO2 93%   BMI 27.37 kg/m  , BMI Body mass index is 27.37 kg/m. GEN: Well nourished, well developed, in no acute distress  HEENT: normal  Neck: no JVD, carotid bruits, or masses Cardiac: RRR; no murmurs, rubs, or gallops,no edema ; 2+ PT pulses bilaterally Respiratory:  clear to auscultation bilaterally, normal work of breathing GI: soft, nontender, nondistended, + BS MS: no deformity or atrophy  Skin: warm and dry, no rash Neuro:  Strength and sensation are intact Psych: euthymic mood, full affect   EKG:   The ekg ordered today demonstrates NSR, anterior T wave inversion   Recent Labs: 05/24/2017: ALT 71; BUN 11; Creatinine, Ser 0.86; Potassium 4.6; Sodium 136   Lipid Panel    Component Value Date/Time   CHOL 224 (H) 05/24/2017 0845   TRIG 141 05/24/2017 0845   HDL 57 05/24/2017 0845   CHOLHDL 3.9 05/24/2017 0845   LDLCALC 139 (H) 05/24/2017 0845     Other studies Reviewed: Additional studies/ records that were reviewed today with results demonstrating: old ECGs reviewed.  Echo from 2015 showed normal LVEF .   ASSESSMENT AND PLAN:  1. CAD: moderate RCA disease noted in the past.  No angina.  Try to increase exercise to improve stamina. Continue night time oxygen.  SHOB at night is more emotional since it only occurs with nightmares.  He is deconditioned as well due to lack of exercise.  2. Alcohol abuse:  Continue to avoid alcohol.  Working with Dr. [name].  3. Hyperlipidemia: LDL decreased to 98 on Zetia and Crestor, from 139. 4. Abnormal ECG: Chronic T wave changes.  No change today from prior ECG.   Current medicines are reviewed at length with the patient today.  The patient concerns regarding his medicines were addressed.  The following changes have been made:  No  change  Labs/ tests ordered today include:  No orders of the defined types were placed in this encounter.   Recommend 150 minutes/week of aerobic exercise Low fat, low carb, high fiber diet recommended  Disposition:   FU in 6 months   Signed, Larae Grooms, MD  10/10/2017 8:17 AM    [facility]  [address], [place], [place]  [address] Phone: [phone]; Fax: [phone]

## 2017-10-10 ENCOUNTER — Encounter: Payer: Self-pay | Admitting: Interventional Cardiology

## 2017-10-10 ENCOUNTER — Ambulatory Visit (INDEPENDENT_AMBULATORY_CARE_PROVIDER_SITE_OTHER): Payer: Medicare Other | Admitting: Interventional Cardiology

## 2017-10-10 VITALS — BP 142/78 | HR 95 | Ht 74.0 in | Wt 213.2 lb

## 2017-10-10 DIAGNOSIS — Z789 Other specified health status: Secondary | ICD-10-CM

## 2017-10-10 DIAGNOSIS — I25119 Atherosclerotic heart disease of native coronary artery with unspecified angina pectoris: Secondary | ICD-10-CM | POA: Diagnosis not present

## 2017-10-10 DIAGNOSIS — E782 Mixed hyperlipidemia: Secondary | ICD-10-CM | POA: Diagnosis not present

## 2017-10-10 DIAGNOSIS — R9431 Abnormal electrocardiogram [ECG] [EKG]: Secondary | ICD-10-CM | POA: Diagnosis not present

## 2017-10-10 DIAGNOSIS — Z7289 Other problems related to lifestyle: Secondary | ICD-10-CM

## 2017-10-10 NOTE — Patient Instructions (Signed)

## 2017-10-26 ENCOUNTER — Ambulatory Visit: Payer: Medicare Other | Admitting: Internal Medicine

## 2017-11-22 DIAGNOSIS — H5213 Myopia, bilateral: Secondary | ICD-10-CM | POA: Diagnosis not present

## 2017-11-22 DIAGNOSIS — R7303 Prediabetes: Secondary | ICD-10-CM | POA: Diagnosis not present

## 2017-11-22 DIAGNOSIS — H2513 Age-related nuclear cataract, bilateral: Secondary | ICD-10-CM | POA: Diagnosis not present

## 2017-11-22 DIAGNOSIS — H52203 Unspecified astigmatism, bilateral: Secondary | ICD-10-CM | POA: Diagnosis not present

## 2017-11-22 DIAGNOSIS — H524 Presbyopia: Secondary | ICD-10-CM | POA: Diagnosis not present

## 2017-12-01 ENCOUNTER — Emergency Department (HOSPITAL_COMMUNITY)
Admission: EM | Admit: 2017-12-01 | Discharge: 2017-12-01 | Disposition: A | Discharge status: Home / Self Care | Payer: Medicare Other | Attending: Emergency Medicine | Admitting: Emergency Medicine

## 2017-12-01 ENCOUNTER — Emergency Department (HOSPITAL_COMMUNITY): Payer: Medicare Other

## 2017-12-01 DIAGNOSIS — Z23 Encounter for immunization: Secondary | ICD-10-CM | POA: Diagnosis not present

## 2017-12-01 DIAGNOSIS — S0031XA Abrasion of nose, initial encounter: Secondary | ICD-10-CM | POA: Insufficient documentation

## 2017-12-01 DIAGNOSIS — I2510 Atherosclerotic heart disease of native coronary artery without angina pectoris: Secondary | ICD-10-CM | POA: Insufficient documentation

## 2017-12-01 DIAGNOSIS — S199XXA Unspecified injury of neck, initial encounter: Secondary | ICD-10-CM | POA: Diagnosis not present

## 2017-12-01 DIAGNOSIS — W19XXXA Unspecified fall, initial encounter: Secondary | ICD-10-CM

## 2017-12-01 DIAGNOSIS — S0081XA Abrasion of other part of head, initial encounter: Secondary | ICD-10-CM

## 2017-12-01 DIAGNOSIS — J45909 Unspecified asthma, uncomplicated: Secondary | ICD-10-CM | POA: Insufficient documentation

## 2017-12-01 DIAGNOSIS — Y93K1 Activity, walking an animal: Secondary | ICD-10-CM | POA: Insufficient documentation

## 2017-12-01 DIAGNOSIS — Y999 Unspecified external cause status: Secondary | ICD-10-CM | POA: Diagnosis not present

## 2017-12-01 DIAGNOSIS — Y929 Unspecified place or not applicable: Secondary | ICD-10-CM | POA: Diagnosis not present

## 2017-12-01 DIAGNOSIS — S0181XA Laceration without foreign body of other part of head, initial encounter: Secondary | ICD-10-CM | POA: Diagnosis not present

## 2017-12-01 DIAGNOSIS — S0990XA Unspecified injury of head, initial encounter: Secondary | ICD-10-CM | POA: Diagnosis not present

## 2017-12-01 DIAGNOSIS — W1830XA Fall on same level, unspecified, initial encounter: Secondary | ICD-10-CM | POA: Diagnosis not present

## 2017-12-01 DIAGNOSIS — S0993XA Unspecified injury of face, initial encounter: Secondary | ICD-10-CM | POA: Diagnosis not present

## 2017-12-01 MED ORDER — TETANUS-DIPHTH-ACELL PERTUSSIS 5-2.5-18.5 LF-MCG/0.5 IM SUSP
0.5000 mL | Freq: Once | INTRAMUSCULAR | Status: AC
  Administered 2017-12-01: 0.5 mL via INTRAMUSCULAR
  Filled 2017-12-01: qty 0.5

## 2017-12-01 MED ORDER — LORAZEPAM 1 MG PO TABS
1.0000 mg | ORAL_TABLET | Freq: Once | ORAL | Status: AC
  Administered 2017-12-01: 1 mg via ORAL
  Filled 2017-12-01: qty 1

## 2017-12-01 NOTE — ED Notes (Signed)
Pt was informed he needed to ambulate on PulseOx and Pt refused, stating that he did not feel well enough to ambulate because he was too dizzy. Janett Billow, RN notified of Pt's refusal.

## 2017-12-01 NOTE — ED Provider Notes (Signed)
Medical screening examination/treatment/procedure(s) were conducted as a shared visit with non-physician practitioner(s) and myself.  I personally evaluated the patient during the encounter.  None   Results for orders placed or performed in visit on 05/24/17  Comprehensive metabolic panel  Result Value Ref Range   Glucose 148 (H) 65 - 99 mg/dL   BUN 11 8 - 27 mg/dL   Creatinine, Ser 0.86 0.76 - 1.27 mg/dL   GFR calc non Af Amer 86 >59 mL/min/1.73   GFR calc Af Amer 99 >59 mL/min/1.73   BUN/Creatinine Ratio 13 10 - 24   Sodium 136 134 - 144 mmol/L   Potassium 4.6 3.5 - 5.2 mmol/L   Chloride 97 96 - 106 mmol/L   CO2 18 (L) 20 - 29 mmol/L   Calcium 9.3 8.6 - 10.2 mg/dL   Total Protein 6.9 6.0 - 8.5 g/dL   Albumin 4.5 3.5 - 4.8 g/dL   Globulin, Total 2.4 1.5 - 4.5 g/dL   Albumin/Globulin Ratio 1.9 1.2 - 2.2   Bilirubin Total 1.1 0.0 - 1.2 mg/dL   Alkaline Phosphatase 75 39 - 117 IU/L   AST 81 (H) 0 - 40 IU/L   ALT 71 (H) 0 - 44 IU/L  Lipid panel  Result Value Ref Range   Cholesterol, Total 224 (H) 100 - 199 mg/dL   Triglycerides 141 0 - 149 mg/dL   HDL 57 >39 mg/dL   VLDL Cholesterol Cal 28 5 - 40 mg/dL   LDL Calculated 139 (H) 0 - 99 mg/dL   Chol/HDL Ratio 3.9 0.0 - 5.0 ratio   Ct Head Wo Contrast  Result Date: 12/01/2017 CLINICAL DATA:  Pt has been drinking this AM and was walking dog when leash came out of his hands and he lunged forward to catch dog. Pt landed in grass, hit face with glasses on, lac noted to bridge of nose. Pt not on blood thinners, EXAM: CT HEAD WITHOUT CONTRAST CT MAXILLOFACIAL WITHOUT CONTRAST CT CERVICAL SPINE WITHOUT CONTRAST TECHNIQUE: Multidetector CT imaging of the head, cervical spine, and maxillofacial structures were performed using the standard protocol without intravenous contrast. Multiplanar CT image reconstructions of the cervical spine and maxillofacial structures were also generated. COMPARISON:  None. FINDINGS: CT HEAD FINDINGS Brain: No  evidence of acute infarction, hemorrhage, hydrocephalus, extra-axial collection or mass lesion/mass effect. There is ventricular and sulcal enlargement reflecting mild generalized atrophy. Vascular: No hyperdense vessel or unexpected calcification. Skull: Normal. Negative for fracture or focal lesion. Other: None. CT MAXILLOFACIAL FINDINGS Osseous: No fracture or mandibular dislocation. No destructive process. Orbits: Negative. No traumatic or inflammatory finding. Sinuses: Mild polypoid mucosal thickening in the inferior maxillary sinuses. Mild ethmoid mucosal thickening. Remaining sinuses are clear. Clear mastoid air cells and middle ear cavities. Soft tissues: Mild soft tissue swelling of the nose. No other soft tissue swelling. No masses or enlarged lymph nodes. CT CERVICAL SPINE FINDINGS Alignment: Normal. Skull base and vertebrae: No acute fracture. No primary bone lesion or focal pathologic process. Soft tissues and spinal canal: No prevertebral fluid or swelling. No visible canal hematoma. Disc levels: Moderate loss of disc height at C5-C6 with mild spondylotic disc bulging. Facet degenerative changes noted greatest on the right at C3-C4. Uncovertebral spurring causes moderate neural foraminal narrowing on the right at C5-C6. No convincing disc herniation. Upper chest: No acute findings. No masses or adenopathy. Lung apices are clear. Other: None. IMPRESSION: HEAD CT 1. No acute intracranial abnormalities. 2. No skull fracture. MAXILLOFACIAL CT 1. No fracture. 2. Soft  tissue swelling of the nose.  No other acute abnormality. CERVICAL CT 1. No fracture or acute finding. Electronically Signed   By: Lajean Manes M.D.   On: 12/01/2017 14:55   Ct Cervical Spine Wo Contrast  Result Date: 12/01/2017 CLINICAL DATA:  Pt has been drinking this AM and was walking dog when leash came out of his hands and he lunged forward to catch dog. Pt landed in grass, hit face with glasses on, lac noted to bridge of nose. Pt  not on blood thinners, EXAM: CT HEAD WITHOUT CONTRAST CT MAXILLOFACIAL WITHOUT CONTRAST CT CERVICAL SPINE WITHOUT CONTRAST TECHNIQUE: Multidetector CT imaging of the head, cervical spine, and maxillofacial structures were performed using the standard protocol without intravenous contrast. Multiplanar CT image reconstructions of the cervical spine and maxillofacial structures were also generated. COMPARISON:  None. FINDINGS: CT HEAD FINDINGS Brain: No evidence of acute infarction, hemorrhage, hydrocephalus, extra-axial collection or mass lesion/mass effect. There is ventricular and sulcal enlargement reflecting mild generalized atrophy. Vascular: No hyperdense vessel or unexpected calcification. Skull: Normal. Negative for fracture or focal lesion. Other: None. CT MAXILLOFACIAL FINDINGS Osseous: No fracture or mandibular dislocation. No destructive process. Orbits: Negative. No traumatic or inflammatory finding. Sinuses: Mild polypoid mucosal thickening in the inferior maxillary sinuses. Mild ethmoid mucosal thickening. Remaining sinuses are clear. Clear mastoid air cells and middle ear cavities. Soft tissues: Mild soft tissue swelling of the nose. No other soft tissue swelling. No masses or enlarged lymph nodes. CT CERVICAL SPINE FINDINGS Alignment: Normal. Skull base and vertebrae: No acute fracture. No primary bone lesion or focal pathologic process. Soft tissues and spinal canal: No prevertebral fluid or swelling. No visible canal hematoma. Disc levels: Moderate loss of disc height at C5-C6 with mild spondylotic disc bulging. Facet degenerative changes noted greatest on the right at C3-C4. Uncovertebral spurring causes moderate neural foraminal narrowing on the right at C5-C6. No convincing disc herniation. Upper chest: No acute findings. No masses or adenopathy. Lung apices are clear. Other: None. IMPRESSION: HEAD CT 1. No acute intracranial abnormalities. 2. No skull fracture. MAXILLOFACIAL CT 1. No fracture.  2. Soft tissue swelling of the nose.  No other acute abnormality. CERVICAL CT 1. No fracture or acute finding. Electronically Signed   By: Lajean Manes M.D.   On: 12/01/2017 14:55   Ct Maxillofacial Wo Contrast  Result Date: 12/01/2017 CLINICAL DATA:  Pt has been drinking this AM and was walking dog when leash came out of his hands and he lunged forward to catch dog. Pt landed in grass, hit face with glasses on, lac noted to bridge of nose. Pt not on blood thinners, EXAM: CT HEAD WITHOUT CONTRAST CT MAXILLOFACIAL WITHOUT CONTRAST CT CERVICAL SPINE WITHOUT CONTRAST TECHNIQUE: Multidetector CT imaging of the head, cervical spine, and maxillofacial structures were performed using the standard protocol without intravenous contrast. Multiplanar CT image reconstructions of the cervical spine and maxillofacial structures were also generated. COMPARISON:  None. FINDINGS: CT HEAD FINDINGS Brain: No evidence of acute infarction, hemorrhage, hydrocephalus, extra-axial collection or mass lesion/mass effect. There is ventricular and sulcal enlargement reflecting mild generalized atrophy. Vascular: No hyperdense vessel or unexpected calcification. Skull: Normal. Negative for fracture or focal lesion. Other: None. CT MAXILLOFACIAL FINDINGS Osseous: No fracture or mandibular dislocation. No destructive process. Orbits: Negative. No traumatic or inflammatory finding. Sinuses: Mild polypoid mucosal thickening in the inferior maxillary sinuses. Mild ethmoid mucosal thickening. Remaining sinuses are clear. Clear mastoid air cells and middle ear cavities. Soft tissues: Mild soft tissue swelling  of the nose. No other soft tissue swelling. No masses or enlarged lymph nodes. CT CERVICAL SPINE FINDINGS Alignment: Normal. Skull base and vertebrae: No acute fracture. No primary bone lesion or focal pathologic process. Soft tissues and spinal canal: No prevertebral fluid or swelling. No visible canal hematoma. Disc levels: Moderate loss  of disc height at C5-C6 with mild spondylotic disc bulging. Facet degenerative changes noted greatest on the right at C3-C4. Uncovertebral spurring causes moderate neural foraminal narrowing on the right at C5-C6. No convincing disc herniation. Upper chest: No acute findings. No masses or adenopathy. Lung apices are clear. Other: None. IMPRESSION: HEAD CT 1. No acute intracranial abnormalities. 2. No skull fracture. MAXILLOFACIAL CT 1. No fracture. 2. Soft tissue swelling of the nose.  No other acute abnormality. CERVICAL CT 1. No fracture or acute finding. Electronically Signed   By: Lajean Manes M.D.   On: 12/01/2017 14:55     Patient seen by me along with physician assistant.  Patient brought in by EMS.  Patient had a history of alcohol problems in the past but had been abstinent and then started drinking this morning walking the dog when least came out of his hands he lunged forward to catch the dog he landed in the grass.  He hit his face and glasses on the ground.  Laceration noted to the bridge of the nose.  Patient not on blood thinners.  Arrived with c-collar in place.  Room air sats were 92% on room air.  CT of head face and neck without any acute findings.  Family members here.  Patient stable for discharge home.  Patient with a bandage on the bridge of the nose.  Lungs clear abdomen soft neurologically grossly intact without any significant focal deficits.     Fredia Sorrow, MD 12/01/17 [phone]

## 2017-12-01 NOTE — ED Provider Notes (Signed)
[facility] EMERGENCY DEPARTMENT Provider Note   CSN: [id number] Arrival date & time: 12/01/17  1217     History   Chief Complaint Chief Complaint  Patient presents with  . Fall    HPI Eduardo Lall. is a 72 y.o. male with history of CAD, EtOH abuse, hyperlipidemia here for evaluation after fall PTA.  Patient is upset and refusing to provide history, history obtained by triage note, daughter at bedside.  Patient will only answer yes or no to my questions.  He denies any pain.  Reportedly, patient had been drinking alcohol earlier today and went for a walk with his dog, his dog pulled on the lesion he fell forward landing on his face mostly.  He was wearing glasses.  He sustained a small laceration to the bridge of his nose.  No anticoagulants.  He does deny LOC or prodromal symptoms.  HPI  Past Medical History:  Diagnosis Date  . Asthma    as a child  . Depression    sees Dr [name]  . History of BPH    Dr.Dahlstedt  . Hyperlipidemia   . Sleep disturbances     Patient Active Problem List   Diagnosis Date Noted  . Prediabetes 11/08/2016  . Alcohol use 07/13/2015  . Dyspnea on exertion 04/02/2015  . Insomnia 04/02/2015  . Palpitations 01/27/2015  . Nocturnal hypoxia 01/27/2015  . Right-sided chest wall pain 11/26/2014  . Hypotension 09/08/2014  . Cough 06/09/2014  . Coronary atherosclerosis of native coronary artery 05/06/2014  . Nonspecific abnormal unspecified cardiovascular function study 04/21/2014  . Mixed hyperlipidemia 07/28/2013  . Nonspecific abnormal electrocardiogram (ECG) (EKG) 07/28/2013    Past Surgical History:  Procedure Laterality Date  . LEFT HEART CATHETERIZATION WITH CORONARY ANGIOGRAM N/A 04/30/2014   Procedure: LEFT HEART CATHETERIZATION WITH CORONARY ANGIOGRAM;  Surgeon: [name], MD;  Location: [facility] CATH LAB;  Service: Cardiovascular;  Laterality: N/A;  . TONSILLECTOMY          Home Medications     Prior to Admission medications   Medication Sig Start Date End Date Taking? Authorizing Provider  acamprosate (CAMPRAL) 333 MG tablet Take 2 tablets by mouth daily. 08/31/17  Yes [provider]  alfuzosin (UROXATRAL) 10 MG 24 hr tablet Take 10 mg by mouth daily.   Yes [provider]  B Complex Vitamins (B COMPLEX PO) Take 1 tablet by mouth daily.    Yes [provider]  CALCIUM PO Take 1 tablet by mouth daily.    Yes [provider]  carvedilol (COREG) 3.125 MG tablet Take 3.125 mg by mouth daily. Takes 1/2 tablet by mouth daily   Yes [provider]  Cholecalciferol (VITAMIN D3) 5000 units CAPS Take 1 capsule by mouth daily.   Yes [provider]  ezetimibe (ZETIA) 10 MG tablet Take 1 tablet (10 mg total) by mouth daily. 06/06/17  Yes [name], MD  finasteride (PROSCAR) 5 MG tablet Take 1 tablet by mouth every other night   Yes [provider]  fluticasone (FLONASE) 50 MCG/ACT nasal spray Place 1 spray into both nostrils daily.    Yes [provider]  hydrOXYzine (ATARAX/VISTARIL) 10 MG tablet Take 10 mg by mouth 3 (three) times daily as needed for anxiety.    Yes [provider]  LORazepam (ATIVAN) 1 MG tablet Take 1 mg by mouth at bedtime.   Yes [provider]  MAGNESIUM PO Take 1 tablet by mouth daily.  Yes [provider]  Omega-3 Fatty Acids (FISH OIL) 1000 MG CAPS Take 1 capsule by mouth daily.   Yes [provider]  rosuvastatin (CRESTOR) 5 MG tablet Take 5 mg by mouth every other day. 09/24/17  Yes [provider]  KRILL OIL PO Take 500 mg by mouth daily.     [provider]    Family History Family History  Problem Relation Age of Onset  . Dementia Mother   . Stroke Mother   . Cancer - Prostate Father   . Hyperlipidemia Sister        high cholestrol  . Heart attack Neg Hx   . Hypertension Neg Hx     Social History Social History    Tobacco Use  . Smoking status: Never Smoker  . Smokeless tobacco: Never Used  Substance Use Topics  . Alcohol use: No    Alcohol/week: 1.8 oz    Types: 3 Glasses of wine per week    Comment: pt has not had any wine since 04/2014  . Drug use: No     Allergies   Zocor [simvastatin] and Trazodone and nefazodone   Review of Systems Review of Systems  Skin: Positive for wound.  All other systems reviewed and are negative.    Physical Exam Updated Vital Signs BP 104/65 (BP Location: Right Arm)   Pulse 98   Resp 18   SpO2 93%   Physical Exam  Constitutional: He is oriented to person, place, and time. He appears well-developed and well-nourished. He is cooperative. He is easily aroused. No distress.  Looks anxious, teary-eyed.  HENT:  Dried blood over the bridge of the nose over small laceration,  Focal mild tenderness.  No scalp or other facial bone tenderness or crepitus. No Raccoon's eyes. No Battle's sign. No hemotympanum, bilaterally. No epistaxis, septum midline. No otorrhea. No rhinorrhea.  No intraoral bleeding or injury  Eyes:  Lids normal EOMs and PERRL intact without pain No conjunctival injection  Neck: Spinous process tenderness present.  In cervical collar.  Midline spinous process tenderness. ROM not examined. 2+ carotid pulses bilaterally without bruits Trachea midline  Cardiovascular: Normal rate, regular rhythm, S1 normal, S2 normal and normal heart sounds. Exam reveals no distant heart sounds and no friction rub.  No murmur heard. Pulses:      Carotid pulses are 2+ on the right side, and 2+ on the left side.      Radial pulses are 2+ on the right side, and 2+ on the left side.       Dorsalis pedis pulses are 2+ on the right side, and 2+ on the left side.  Pulmonary/Chest: Effort normal and breath sounds normal. No respiratory distress. He has no decreased breath sounds.  No chest wall tenderness Equal and symmetric chest wall expansion    Abdominal:  Abdomen is soft NTND  Musculoskeletal: Normal range of motion. He exhibits no deformity.  Neurological: He is alert, oriented to person, place, and time and easily aroused.  Alert and oriented to self, place, time and event.  Speech is fluent without obvious dysarthria or dysphasia. Strength 5/5 with hand grip and ankle F/E.   Sensation to light touch intact in hands and feet. CN I and VIII not tested. CN II-XII grossly intact bilaterally.      ED Treatments / Results  Labs (all labs ordered are listed, but only abnormal results are displayed) Labs Reviewed - No data to display  EKG None  Radiology Ct  Head Wo Contrast  Result Date: 12/01/2017 CLINICAL DATA:  Pt has been drinking this AM and was walking dog when leash came out of his hands and he lunged forward to catch dog. Pt landed in grass, hit face with glasses on, lac noted to bridge of nose. Pt not on blood thinners, EXAM: CT HEAD WITHOUT CONTRAST CT MAXILLOFACIAL WITHOUT CONTRAST CT CERVICAL SPINE WITHOUT CONTRAST TECHNIQUE: Multidetector CT imaging of the head, cervical spine, and maxillofacial structures were performed using the standard protocol without intravenous contrast. Multiplanar CT image reconstructions of the cervical spine and maxillofacial structures were also generated. COMPARISON:  None. FINDINGS: CT HEAD FINDINGS Brain: No evidence of acute infarction, hemorrhage, hydrocephalus, extra-axial collection or mass lesion/mass effect. There is ventricular and sulcal enlargement reflecting mild generalized atrophy. Vascular: No hyperdense vessel or unexpected calcification. Skull: Normal. Negative for fracture or focal lesion. Other: None. CT MAXILLOFACIAL FINDINGS Osseous: No fracture or mandibular dislocation. No destructive process. Orbits: Negative. No traumatic or inflammatory finding. Sinuses: Mild polypoid mucosal thickening in the inferior maxillary sinuses. Mild ethmoid mucosal thickening. Remaining  sinuses are clear. Clear mastoid air cells and middle ear cavities. Soft tissues: Mild soft tissue swelling of the nose. No other soft tissue swelling. No masses or enlarged lymph nodes. CT CERVICAL SPINE FINDINGS Alignment: Normal. Skull base and vertebrae: No acute fracture. No primary bone lesion or focal pathologic process. Soft tissues and spinal canal: No prevertebral fluid or swelling. No visible canal hematoma. Disc levels: Moderate loss of disc height at C5-C6 with mild spondylotic disc bulging. Facet degenerative changes noted greatest on the right at C3-C4. Uncovertebral spurring causes moderate neural foraminal narrowing on the right at C5-C6. No convincing disc herniation. Upper chest: No acute findings. No masses or adenopathy. Lung apices are clear. Other: None. IMPRESSION: HEAD CT 1. No acute intracranial abnormalities. 2. No skull fracture. MAXILLOFACIAL CT 1. No fracture. 2. Soft tissue swelling of the nose.  No other acute abnormality. CERVICAL CT 1. No fracture or acute finding. Electronically Signed   By: Lajean Manes M.D.   On: 12/01/2017 14:55   Ct Cervical Spine Wo Contrast  Result Date: 12/01/2017 CLINICAL DATA:  Pt has been drinking this AM and was walking dog when leash came out of his hands and he lunged forward to catch dog. Pt landed in grass, hit face with glasses on, lac noted to bridge of nose. Pt not on blood thinners, EXAM: CT HEAD WITHOUT CONTRAST CT MAXILLOFACIAL WITHOUT CONTRAST CT CERVICAL SPINE WITHOUT CONTRAST TECHNIQUE: Multidetector CT imaging of the head, cervical spine, and maxillofacial structures were performed using the standard protocol without intravenous contrast. Multiplanar CT image reconstructions of the cervical spine and maxillofacial structures were also generated. COMPARISON:  None. FINDINGS: CT HEAD FINDINGS Brain: No evidence of acute infarction, hemorrhage, hydrocephalus, extra-axial collection or mass lesion/mass effect. There is ventricular and  sulcal enlargement reflecting mild generalized atrophy. Vascular: No hyperdense vessel or unexpected calcification. Skull: Normal. Negative for fracture or focal lesion. Other: None. CT MAXILLOFACIAL FINDINGS Osseous: No fracture or mandibular dislocation. No destructive process. Orbits: Negative. No traumatic or inflammatory finding. Sinuses: Mild polypoid mucosal thickening in the inferior maxillary sinuses. Mild ethmoid mucosal thickening. Remaining sinuses are clear. Clear mastoid air cells and middle ear cavities. Soft tissues: Mild soft tissue swelling of the nose. No other soft tissue swelling. No masses or enlarged lymph nodes. CT CERVICAL SPINE FINDINGS Alignment: Normal. Skull base and vertebrae: No acute fracture. No primary bone lesion or focal pathologic process.  Soft tissues and spinal canal: No prevertebral fluid or swelling. No visible canal hematoma. Disc levels: Moderate loss of disc height at C5-C6 with mild spondylotic disc bulging. Facet degenerative changes noted greatest on the right at C3-C4. Uncovertebral spurring causes moderate neural foraminal narrowing on the right at C5-C6. No convincing disc herniation. Upper chest: No acute findings. No masses or adenopathy. Lung apices are clear. Other: None. IMPRESSION: HEAD CT 1. No acute intracranial abnormalities. 2. No skull fracture. MAXILLOFACIAL CT 1. No fracture. 2. Soft tissue swelling of the nose.  No other acute abnormality. CERVICAL CT 1. No fracture or acute finding. Electronically Signed   By: Lajean Manes M.D.   On: 12/01/2017 14:55   Ct Maxillofacial Wo Contrast  Result Date: 12/01/2017 CLINICAL DATA:  Pt has been drinking this AM and was walking dog when leash came out of his hands and he lunged forward to catch dog. Pt landed in grass, hit face with glasses on, lac noted to bridge of nose. Pt not on blood thinners, EXAM: CT HEAD WITHOUT CONTRAST CT MAXILLOFACIAL WITHOUT CONTRAST CT CERVICAL SPINE WITHOUT CONTRAST TECHNIQUE:  Multidetector CT imaging of the head, cervical spine, and maxillofacial structures were performed using the standard protocol without intravenous contrast. Multiplanar CT image reconstructions of the cervical spine and maxillofacial structures were also generated. COMPARISON:  None. FINDINGS: CT HEAD FINDINGS Brain: No evidence of acute infarction, hemorrhage, hydrocephalus, extra-axial collection or mass lesion/mass effect. There is ventricular and sulcal enlargement reflecting mild generalized atrophy. Vascular: No hyperdense vessel or unexpected calcification. Skull: Normal. Negative for fracture or focal lesion. Other: None. CT MAXILLOFACIAL FINDINGS Osseous: No fracture or mandibular dislocation. No destructive process. Orbits: Negative. No traumatic or inflammatory finding. Sinuses: Mild polypoid mucosal thickening in the inferior maxillary sinuses. Mild ethmoid mucosal thickening. Remaining sinuses are clear. Clear mastoid air cells and middle ear cavities. Soft tissues: Mild soft tissue swelling of the nose. No other soft tissue swelling. No masses or enlarged lymph nodes. CT CERVICAL SPINE FINDINGS Alignment: Normal. Skull base and vertebrae: No acute fracture. No primary bone lesion or focal pathologic process. Soft tissues and spinal canal: No prevertebral fluid or swelling. No visible canal hematoma. Disc levels: Moderate loss of disc height at C5-C6 with mild spondylotic disc bulging. Facet degenerative changes noted greatest on the right at C3-C4. Uncovertebral spurring causes moderate neural foraminal narrowing on the right at C5-C6. No convincing disc herniation. Upper chest: No acute findings. No masses or adenopathy. Lung apices are clear. Other: None. IMPRESSION: HEAD CT 1. No acute intracranial abnormalities. 2. No skull fracture. MAXILLOFACIAL CT 1. No fracture. 2. Soft tissue swelling of the nose.  No other acute abnormality. CERVICAL CT 1. No fracture or acute finding. Electronically Signed    By: Lajean Manes M.D.   On: 12/01/2017 14:55    Procedures Procedures (including critical care time)  Medications Ordered in ED Medications  Tdap (BOOSTRIX) injection 0.5 mL (0.5 mLs Intramuscular Given 12/01/17 1328)  LORazepam (ATIVAN) tablet 1 mg (1 mg Oral Given 12/01/17 1342)     Initial Impression / Assessment and Plan / ED Course  I have reviewed the triage vital signs and the nursing notes.  Pertinent labs & imaging results that were available during my care of the patient were reviewed by me and considered in my medical decision making (see chart for details).  Clinical Course as of Dec 01 1941  Sat Dec 01, 2017  1339 RN notified me pt very concerned about CT scan.  Apparently pt very claustrophobic and usually cannot sleep in room with door closed. Will dose ativan   [CG]    Clinical Course User Index [CG] Kinnie Feil, PA-C   72 year old with history of EtOH abuse, depression, here for evaluation after mechanical fall in setting of ETOH on board. Exam as above remarkable for small abrasion to bridge of the nose otherwise reassuring. He does not appear to be clinically intoxicated, holding rational conversation, ambulatory without difficulty in the ED, tolerating orals. At baseline per daughter and wife at bedside. Do not think lab work needed. Will obtain CT.   CT scans reviewed and unremarkable. Small abrasion to nose not amenable for suture repair. Wife and daughter feel comfortable taking patient home, he has long history of EtOH abuse with recent relapse, depression, chronic suicidal ideation for more than 10 years. He has psychiatry follow-up established. Today he denies suicidal ideation, suicidal attempt, homicidal ideation, hallucinations. He does not meet criteria for emergent psychiatric evaluation. Patient deemed appropriate for discharge at this time, encouraged close follow-up with psychiatrist. Discussed return precautions.   Final Clinical Impressions(s) /  ED Diagnoses   Final diagnoses:  Fall, initial encounter  Abrasion of face, initial encounter    ED Discharge Orders    None       Arlean Hopping 12/01/17 1943    Fredia Sorrow, MD 12/02/17 2034

## 2017-12-01 NOTE — Discharge Instructions (Signed)
Keep facial abrasion clean with clean water and mild soap, can apply thin layer of topical ointment to keep moist and prevent infection.  Recommend you check in with psychiatrist in the next 2-3 days.  Talk therapy with a counselor or psychologist may be beneficial.    Return for persistent headache, vision changes, vomiting, one sided numbness or weakness.  Return to the ER if there are sudden changes in your mood including worsening depressive thoughts, thoughts or plans of suicide.

## 2017-12-01 NOTE — ED Triage Notes (Signed)
Pt BIB EMS for fall. Per EMS and daughter, pt has been drinking this AM and was walking dog when leash came out of his hands and he lunged forward to catch dog. Pt landed in grass, hit face with glasses on, lac noted to bridge of nose. Pt not on blood thinners, C-collar in place.  Pt sats 92% on RA PTA, placed on 2 L [place]. Received 4 mg zofran PTA.   Pt not answering this RN at this time, eyes open and tears in eyes. Will not allow this RN to collect temperature or assess pain.

## 2017-12-01 NOTE — ED Notes (Signed)
Patient transported to CT 

## 2017-12-05 DIAGNOSIS — F329 Major depressive disorder, single episode, unspecified: Secondary | ICD-10-CM | POA: Diagnosis not present

## 2018-01-31 ENCOUNTER — Ambulatory Visit: Payer: Medicare Other | Admitting: Internal Medicine

## 2018-02-21 DIAGNOSIS — R2689 Other abnormalities of gait and mobility: Secondary | ICD-10-CM | POA: Diagnosis not present

## 2018-02-21 DIAGNOSIS — E119 Type 2 diabetes mellitus without complications: Secondary | ICD-10-CM | POA: Diagnosis not present

## 2018-03-25 DIAGNOSIS — F329 Major depressive disorder, single episode, unspecified: Secondary | ICD-10-CM | POA: Diagnosis not present

## 2018-04-22 ENCOUNTER — Ambulatory Visit: Payer: Medicare Other | Admitting: Internal Medicine

## 2018-04-25 DIAGNOSIS — Z23 Encounter for immunization: Secondary | ICD-10-CM | POA: Diagnosis not present

## 2018-05-06 DIAGNOSIS — L820 Inflamed seborrheic keratosis: Secondary | ICD-10-CM | POA: Diagnosis not present

## 2018-05-06 DIAGNOSIS — L812 Freckles: Secondary | ICD-10-CM | POA: Diagnosis not present

## 2018-05-06 DIAGNOSIS — L570 Actinic keratosis: Secondary | ICD-10-CM | POA: Diagnosis not present

## 2018-05-06 DIAGNOSIS — L821 Other seborrheic keratosis: Secondary | ICD-10-CM | POA: Diagnosis not present

## 2018-05-06 DIAGNOSIS — D1801 Hemangioma of skin and subcutaneous tissue: Secondary | ICD-10-CM | POA: Diagnosis not present

## 2018-05-06 DIAGNOSIS — I788 Other diseases of capillaries: Secondary | ICD-10-CM | POA: Diagnosis not present

## 2018-06-18 ENCOUNTER — Encounter: Payer: Self-pay | Admitting: Psychiatry

## 2018-06-18 ENCOUNTER — Ambulatory Visit (INDEPENDENT_AMBULATORY_CARE_PROVIDER_SITE_OTHER): Payer: Medicare Other | Admitting: Psychiatry

## 2018-06-18 DIAGNOSIS — F1010 Alcohol abuse, uncomplicated: Secondary | ICD-10-CM | POA: Diagnosis not present

## 2018-06-18 DIAGNOSIS — F411 Generalized anxiety disorder: Secondary | ICD-10-CM | POA: Diagnosis not present

## 2018-06-18 DIAGNOSIS — F5105 Insomnia due to other mental disorder: Secondary | ICD-10-CM | POA: Diagnosis not present

## 2018-06-18 DIAGNOSIS — I25119 Atherosclerotic heart disease of native coronary artery with unspecified angina pectoris: Secondary | ICD-10-CM

## 2018-06-18 DIAGNOSIS — F99 Mental disorder, not otherwise specified: Secondary | ICD-10-CM

## 2018-06-18 NOTE — Progress Notes (Signed)
Eduardo Watts [id number] August 13, 1945 72 y.o.  Subjective:   Patient ID:  Eduardo Watts. is a 72 y.o. (DOB [birth year]) male.  Chief Complaint:  Chief Complaint  Patient presents with  . Sleeping Problem  . Anxiety    HPI Eduardo Watts. presents to the office today for follow-up of anxiety and sleep problems. Cheated a little with wine which drives my wife crazy.  Will have a glass of red wine as well. Asked questions re value of NAC.  I'm a hell of a lot better than in the past.  He had done better and stopped entirely for 30 days.  Haven't again for weeks.  Campral helped but couldn't tolerate naltrexone. Not drinking at night.  W is very upset over his alcohol.  She does not want him to have any alcohol at all. No physical craving but if he thinks of it would like tohave a drink to relax from a stressful day.  W then talks to D about it when he drinks.  2 glasses of wine in 3 mos.  Latest 2 weeks ago.  Admits he has a historic problem with alcohol but it's under control.  Does have STM px and discussed the NAC.   Review of Systems:  Review of Systems  Neurological: Negative for tremors and weakness.  Psychiatric/Behavioral: Positive for sleep disturbance. Negative for agitation, behavioral problems, confusion, decreased concentration, dysphoric mood, hallucinations, self-injury and suicidal ideas. The patient is not nervous/anxious and is not hyperactive.     Medications: I have reviewed the patient's current medications.  Current Outpatient Medications  Medication Sig Dispense Refill  . alfuzosin (UROXATRAL) 10 MG 24 hr tablet Take 10 mg by mouth daily.    . B Complex Vitamins (B COMPLEX PO) Take 1 tablet by mouth daily.     Marland Kitchen CALCIUM PO Take 1 tablet by mouth daily.     . carvedilol (COREG) 3.125 MG tablet Take 3.125 mg by mouth daily. Takes 1/2 tablet by mouth daily    . Cholecalciferol (VITAMIN D3) 5000 units CAPS Take 1 capsule by mouth daily.    Marland Kitchen  ezetimibe (ZETIA) 10 MG tablet Take 1 tablet (10 mg total) by mouth daily. 30 tablet 11  . finasteride (PROSCAR) 5 MG tablet Take 1 tablet by mouth every other night    . fluticasone (FLONASE) 50 MCG/ACT nasal spray Place 1 spray into both nostrils daily.     . hydrOXYzine (ATARAX/VISTARIL) 10 MG tablet Take 10 mg by mouth 3 (three) times daily as needed for anxiety.     Marland Kitchen KRILL OIL PO Take 500 mg by mouth daily.     Marland Kitchen LORazepam (ATIVAN) 1 MG tablet Take 1 mg by mouth at bedtime.    Marland Kitchen MAGNESIUM PO Take 1 tablet by mouth daily.     . Omega-3 Fatty Acids (FISH OIL) 1000 MG CAPS Take 1 capsule by mouth daily.    . rosuvastatin (CRESTOR) 5 MG tablet Take 5 mg by mouth every other day.     No current facility-administered medications for this visit.     Medication Side Effects: None  Allergies:  Allergies  Allergen Reactions  . Zocor [Simvastatin] Other (See Comments)    Muscle aches  . Trazodone And Nefazodone Other (See Comments)    "crazy"    Past Medical History:  Diagnosis Date  . Asthma    as a child  . Depression    sees Dr [name]  . History of  BPH    Dr.Dahlstedt  . Hyperlipidemia   . Sleep disturbances     Family History  Problem Relation Age of Onset  . Dementia Mother   . Stroke Mother   . Cancer - Prostate Father   . Hyperlipidemia Sister        high cholestrol  . Heart attack Neg Hx   . Hypertension Neg Hx     Social History   Socioeconomic History  . Marital status: Married    Spouse name: Not on file  . Number of children: Not on file  . Years of education: Not on file  . Highest education level: Not on file  Occupational History  . Not on file  Social Needs  . Financial resource strain: Not on file  . Food insecurity:    Worry: Not on file    Inability: Not on file  . Transportation needs:    Medical: Not on file    Non-medical: Not on file  Tobacco Use  . Smoking status: Never Smoker  . Smokeless tobacco: Never Used  Substance and Sexual  Activity  . Alcohol use: No    Alcohol/week: 3.0 standard drinks    Types: 3 Glasses of wine per week    Comment: pt has not had any wine since 04/2014  . Drug use: No  . Sexual activity: Not on file  Lifestyle  . Physical activity:    Days per week: Not on file    Minutes per session: Not on file  . Stress: Not on file  Relationships  . Social connections:    Talks on phone: Not on file    Gets together: Not on file    Attends religious service: Not on file    Active member of club or organization: Not on file    Attends meetings of clubs or organizations: Not on file    Relationship status: Not on file  . Intimate partner violence:    Fear of current or ex partner: Not on file    Emotionally abused: Not on file    Physically abused: Not on file    Forced sexual activity: Not on file  Other Topics Concern  . Not on file  Social History Narrative   Never smoked   Alcohol yes -drinking on the weekends/2 glasses of wine with dinner   Caffeine- yes   Recreational drug use -No   Diet- fruits and vegetables,less fried food more pasta   Exercise- decrease to fatigue ,does yard work,and  walks occasionally    Occupation -employed Press photographer   Married 1 daughter/ 1 granddaughter   Goes by [employer]" loves animals    Past Medical History, Surgical history, Social history, and Family history were reviewed and updated as appropriate.   Please see review of systems for further details on the patient's review from today.   Objective:   Physical Exam:  There were no vitals taken for this visit.  Physical Exam  Constitutional: He is oriented to person, place, and time. He appears well-developed. No distress.  Musculoskeletal: He exhibits no deformity.  Neurological: He is alert and oriented to person, place, and time. He displays no tremor. Coordination and gait normal.  Psychiatric: He has a normal mood and affect. His speech is normal and behavior is normal. Judgment and thought content  normal. His mood appears not anxious. His affect is not angry, not blunt, not labile and not inappropriate. Cognition and memory are normal. He does not exhibit a depressed  mood. He expresses no homicidal and no suicidal ideation. He expresses no suicidal plans and no homicidal plans.  Insight intact. No auditory or visual hallucinations.  He is attentive.    Lab Review:     Component Value Date/Time   NA 136 05/24/2017 0845   K 4.6 05/24/2017 0845   CL 97 05/24/2017 0845   CO2 18 (L) 05/24/2017 0845   GLUCOSE 148 (H) 05/24/2017 0845   GLUCOSE 140 (H) 04/21/2014 0917   BUN 11 05/24/2017 0845   CREATININE 0.86 05/24/2017 0845   CALCIUM 9.3 05/24/2017 0845   PROT 6.9 05/24/2017 0845   ALBUMIN 4.5 05/24/2017 0845   AST 81 (H) 05/24/2017 0845   ALT 71 (H) 05/24/2017 0845   ALKPHOS 75 05/24/2017 0845   BILITOT 1.1 05/24/2017 0845   GFRNONAA 86 05/24/2017 0845   GFRAA 99 05/24/2017 0845       Component Value Date/Time   WBC 7.6 04/21/2014 0917   RBC 4.60 04/21/2014 0917   HGB 15.5 04/21/2014 0917   HCT 46.0 04/21/2014 0917   PLT 235.0 04/21/2014 0917   MCV 100.0 04/21/2014 0917   MCH 31.8 07/02/2012 2005   MCHC 33.7 04/21/2014 0917   RDW 13.4 04/21/2014 0917   LYMPHSABS 1.1 04/21/2014 0917   MONOABS 0.5 04/21/2014 0917   EOSABS 0.2 04/21/2014 0917   BASOSABS 0.0 04/21/2014 0917    No results found for: POCLITH, LITHIUM   No results found for: PHENYTOIN, PHENOBARB, VALPROATE, CBMZ   .res Assessment: Plan:    Generalized anxiety disorder  Alcohol abuse  Insomnia due to other mental disorder  Please see After Visit Summary for patient specific instructions.  Supportive therapy re: conflict with wife over alcohol.  Option of Campral to help craving if needed.  Disc Dr. Narda Rutherford recommendation of NAC 600mg  daily that may help craving and also help with STM.  He feels W Archivist.  Extensive options re: possible interventions for alcohol.    He feels meds help with his  sleep and he doesn't appear to be drinking alcohol with the meds.  Disc risk of alcohol with the meds.    This appt 42 mins  FU 3 mos.  Lynder Parents, MD, DFAPA   Future Appointments  Date Time Provider [place]  07/30/2018  9:00 AM Deneise Lever, MD [facility] None    No orders of the defined types were placed in this encounter.     -------------------------------

## 2018-06-28 DIAGNOSIS — R3912 Poor urinary stream: Secondary | ICD-10-CM | POA: Diagnosis not present

## 2018-06-28 DIAGNOSIS — N401 Enlarged prostate with lower urinary tract symptoms: Secondary | ICD-10-CM | POA: Diagnosis not present

## 2018-06-28 DIAGNOSIS — N486 Induration penis plastica: Secondary | ICD-10-CM | POA: Diagnosis not present

## 2018-06-28 DIAGNOSIS — N5201 Erectile dysfunction due to arterial insufficiency: Secondary | ICD-10-CM | POA: Diagnosis not present

## 2018-07-03 ENCOUNTER — Telehealth: Payer: Self-pay

## 2018-07-03 NOTE — Telephone Encounter (Signed)
Prior Authorization approved for Cyclobenzaprine through 08/06/2018

## 2018-07-30 ENCOUNTER — Ambulatory Visit: Payer: Medicare Other | Admitting: Internal Medicine

## 2018-08-23 ENCOUNTER — Ambulatory Visit (INDEPENDENT_AMBULATORY_CARE_PROVIDER_SITE_OTHER): Payer: Medicare Other | Admitting: Psychiatry

## 2018-08-23 ENCOUNTER — Encounter: Payer: Self-pay | Admitting: Psychiatry

## 2018-08-23 ENCOUNTER — Other Ambulatory Visit: Payer: Self-pay | Admitting: Psychiatry

## 2018-08-23 DIAGNOSIS — F411 Generalized anxiety disorder: Secondary | ICD-10-CM

## 2018-08-23 DIAGNOSIS — F1011 Alcohol abuse, in remission: Secondary | ICD-10-CM | POA: Diagnosis not present

## 2018-08-23 DIAGNOSIS — F99 Mental disorder, not otherwise specified: Secondary | ICD-10-CM

## 2018-08-23 DIAGNOSIS — F5105 Insomnia due to other mental disorder: Secondary | ICD-10-CM

## 2018-08-23 MED ORDER — HYDROXYZINE HCL 10 MG PO TABS
10.0000 mg | ORAL_TABLET | Freq: Three times a day (TID) | ORAL | 5 refills | Status: DC | PRN
Start: 2018-08-23 — End: 2019-03-27

## 2018-08-23 MED ORDER — CYCLOBENZAPRINE HCL 10 MG PO TABS: 10.0000 mg | ORAL_TABLET | Freq: Every day | ORAL | 11 refills | Status: DC

## 2018-08-23 MED ORDER — LORAZEPAM 1 MG PO TABS: 1.0000 mg | ORAL_TABLET | Freq: Every day | ORAL | 5 refills | Status: DC

## 2018-08-23 NOTE — Progress Notes (Signed)
Eduardo Watts [id number] Eduardo Watts 73 y.o.  Subjective:   Patient ID:  Eduardo Watts. is a 73 y.o. (DOB [birth year]) male.  Chief Complaint:  Chief Complaint  Patient presents with  . Follow-up    Medication Management    HPI  Last seen August Eduardo Watts. presents to the office today for follow-up of sleep, anxiety and alcohol abuse.  Prayed to get into control and I think I'm there  RE alcohol and dealing with sister.  Alcohol in remission.   Some concern about depending on lorazepam for sleep.  Can't sleep without it.  Do SE.  On it since 2012.    Patient reports stable mood and denies depressed or irritable moods.  Patient denies any recent difficulty with anxiety.  Patient denies difficulty with sleep initiation or maintenance. Denies appetite disturbance.  Patient reports that energy and motivation have been good.  Patient denies any difficulty with concentration.  Patient denies any suicidal ideation.    Review of Systems:  Review of Systems  Neurological: Positive for numbness. Negative for tremors and weakness.  Psychiatric/Behavioral: Negative for agitation, behavioral problems, confusion, decreased concentration, dysphoric mood, hallucinations, self-injury, sleep disturbance and suicidal ideas. The patient is not nervous/anxious and is not hyperactive.     Medications: I have reviewed the patient's current medications.  Current Outpatient Medications  Medication Sig Dispense Refill  . alfuzosin (UROXATRAL) 10 MG 24 hr tablet Take 10 mg by mouth daily.    . B Complex Vitamins (B COMPLEX PO) Take 1 tablet by mouth daily.     Marland Kitchen CALCIUM PO Take 1 tablet by mouth daily.     . carvedilol (COREG) 3.125 MG tablet Take 3.125 mg by mouth daily. Takes 1/2 tablet by mouth daily    . Cholecalciferol (VITAMIN D3) 5000 units CAPS Take 1 capsule by mouth daily.    . cyclobenzaprine (FLEXERIL) 10 MG tablet Take 10 mg by mouth at bedtime.    Marland Kitchen ezetimibe  (ZETIA) 10 MG tablet Take 1 tablet (10 mg total) by mouth daily. 30 tablet 11  . finasteride (PROSCAR) 5 MG tablet Take 1 tablet by mouth every other night    . fluticasone (FLONASE) 50 MCG/ACT nasal spray Place 1 spray into both nostrils daily.     . hydrOXYzine (ATARAX/VISTARIL) 10 MG tablet Take 10 mg by mouth 3 (three) times daily as needed for anxiety.     Marland Kitchen KRILL OIL PO Take 500 mg by mouth daily.     Marland Kitchen LORazepam (ATIVAN) 1 MG tablet Take 1 mg by mouth at bedtime.    . Omega-3 Fatty Acids (FISH OIL) 1000 MG CAPS Take 1 capsule by mouth daily.    . rosuvastatin (CRESTOR) 5 MG tablet Take 5 mg by mouth every other day.    Marland Kitchen MAGNESIUM PO Take 1 tablet by mouth daily.      No current facility-administered medications for this visit.     Medication Side Effects: None  Allergies:  Allergies  Allergen Reactions  . Zocor [Simvastatin] Other (See Comments)    Muscle aches  . Trazodone And Nefazodone Other (See Comments)    "crazy"    Past Medical History:  Diagnosis Date  . Asthma    as a child  . Depression    sees Dr Eduardo Watts  . History of BPH    Dr.Dahlstedt  . Hyperlipidemia   . Sleep disturbances     Family History  Problem Relation Age of Onset  .  Dementia Mother   . Stroke Mother   . Cancer - Prostate Father   . Hyperlipidemia Sister        high cholestrol  . Heart attack Neg Hx   . Hypertension Neg Hx     Social History   Socioeconomic History  . Marital status: Married    Spouse name: Not on file  . Number of children: Not on file  . Years of education: Not on file  . Highest education level: Not on file  Occupational History  . Not on file  Social Needs  . Financial resource strain: Not on file  . Food insecurity:    Worry: Not on file    Inability: Not on file  . Transportation needs:    Medical: Not on file    Non-medical: Not on file  Tobacco Use  . Smoking status: Never Smoker  . Smokeless tobacco: Never Used  Substance and Sexual Activity   . Alcohol use: No    Alcohol/week: 3.0 standard drinks    Types: 3 Glasses of wine per week    Comment: pt has not had any wine since 04/2014  . Drug use: No  . Sexual activity: Not on file  Lifestyle  . Physical activity:    Days per week: Not on file    Minutes per session: Not on file  . Stress: Not on file  Relationships  . Social connections:    Talks on phone: Not on file    Gets together: Not on file    Attends religious service: Not on file    Active member of club or organization: Not on file    Attends meetings of clubs or organizations: Not on file    Relationship status: Not on file  . Intimate partner violence:    Fear of current or ex partner: Not on file    Emotionally abused: Not on file    Physically abused: Not on file    Forced sexual activity: Not on file  Other Topics Concern  . Not on file  Social History Narrative   Never smoked   Alcohol yes -drinking on the weekends/2 glasses of wine with dinner   Caffeine- yes   Recreational drug use -No   Diet- fruits and vegetables,less fried food more pasta   Exercise- decrease to fatigue ,does yard work,and  walks occasionally    Occupation -employed Press photographer   Married 1 daughter/ 1 granddaughter   Goes by [employer]" loves animals    Past Medical History, Surgical history, Social history, and Family history were reviewed and updated as appropriate.   Please see review of systems for further details on the patient's review from today.   Objective:   Physical Exam:  There were no vitals taken for this visit.  Physical Exam Constitutional:      General: He is not in acute distress.    Appearance: He is well-developed.  Musculoskeletal:        General: No deformity.  Neurological:     Mental Status: He is alert and oriented to person, place, and time.     Motor: No tremor.     Coordination: Coordination normal.     Gait: Gait normal.  Psychiatric:        Attention and Perception: Attention and perception  normal.        Mood and Affect: Mood is not anxious or depressed. Affect is not labile, blunt, angry or inappropriate.  Speech: Speech normal.        Behavior: Behavior normal.        Thought Content: Thought content normal. Thought content does not include homicidal or suicidal ideation. Thought content does not include homicidal or suicidal plan.        Cognition and Memory: Cognition normal.        Judgment: Judgment normal.     Comments: Insight intact. No auditory or visual hallucinations. No delusions.      Lab Review:     Component Value Date/Time   NA 136 05/24/2017 0845   K 4.6 05/24/2017 0845   CL 97 05/24/2017 0845   CO2 18 (L) 05/24/2017 0845   GLUCOSE 148 (H) 05/24/2017 0845   GLUCOSE 140 (H) 04/21/2014 0917   BUN 11 05/24/2017 0845   CREATININE 0.86 05/24/2017 0845   CALCIUM 9.3 05/24/2017 0845   PROT 6.9 05/24/2017 0845   ALBUMIN 4.5 05/24/2017 0845   AST 81 (H) 05/24/2017 0845   ALT 71 (H) 05/24/2017 0845   ALKPHOS 75 05/24/2017 0845   BILITOT 1.1 05/24/2017 0845   GFRNONAA 86 05/24/2017 0845   GFRAA 99 05/24/2017 0845       Component Value Date/Time   WBC 7.6 04/21/2014 0917   RBC 4.60 04/21/2014 0917   HGB 15.5 04/21/2014 0917   HCT 46.0 04/21/2014 0917   PLT 235.0 04/21/2014 0917   MCV 100.0 04/21/2014 0917   MCH 31.8 07/02/2012 2005   MCHC 33.7 04/21/2014 0917   RDW 13.4 04/21/2014 0917   LYMPHSABS 1.1 04/21/2014 0917   MONOABS 0.5 04/21/2014 0917   EOSABS 0.2 04/21/2014 0917   BASOSABS 0.0 04/21/2014 0917    No results found for: POCLITH, LITHIUM   No results found for: PHENYTOIN, PHENOBARB, VALPROATE, CBMZ   .res Assessment: Plan:    Insomnia due to other mental disorder  Generalized anxiety disorder  Mild alcohol abuse in sustained remission   Disc sleep med options including no meds.  Wife complains about him taking sleep meds.  Chronic insomnia otherwise.  Takes Flexeril every other day and doesn't take hydroxyzine on that  night alternates them.  We discussed the short-term risks associated with benzodiazepines including sedation and increased fall risk among others.  Discussed long-term side effect risk including dependence, potential withdrawal symptoms, and the potential eventual dose-related risk of dementia.  Also discussed the consequences of poor sleep on brain and body health.  He's doing well without SE so no changes.  Sleep hygiene.  Enc sobriety to continue.  FU 4 mos  Lynder Parents, MD, DFAPA     Please see After Visit Summary for patient specific instructions.  Future Appointments  Date Time Provider [place]  10/08/2018  9:00 AM Baird Lyons D, MD [facility] None    No orders of the defined types were placed in this encounter.     -------------------------------

## 2018-09-23 DIAGNOSIS — H6120 Impacted cerumen, unspecified ear: Secondary | ICD-10-CM | POA: Diagnosis not present

## 2018-09-23 DIAGNOSIS — Z0000 Encounter for general adult medical examination without abnormal findings: Secondary | ICD-10-CM | POA: Diagnosis not present

## 2018-09-23 DIAGNOSIS — E782 Mixed hyperlipidemia: Secondary | ICD-10-CM | POA: Diagnosis not present

## 2018-09-23 DIAGNOSIS — E559 Vitamin D deficiency, unspecified: Secondary | ICD-10-CM | POA: Diagnosis not present

## 2018-09-23 DIAGNOSIS — R2689 Other abnormalities of gait and mobility: Secondary | ICD-10-CM | POA: Diagnosis not present

## 2018-09-23 DIAGNOSIS — G629 Polyneuropathy, unspecified: Secondary | ICD-10-CM | POA: Diagnosis not present

## 2018-09-23 DIAGNOSIS — E1140 Type 2 diabetes mellitus with diabetic neuropathy, unspecified: Secondary | ICD-10-CM | POA: Diagnosis not present

## 2018-09-23 DIAGNOSIS — I10 Essential (primary) hypertension: Secondary | ICD-10-CM | POA: Diagnosis not present

## 2018-10-08 ENCOUNTER — Encounter: Payer: Self-pay | Admitting: Internal Medicine

## 2018-10-08 ENCOUNTER — Ambulatory Visit (INDEPENDENT_AMBULATORY_CARE_PROVIDER_SITE_OTHER): Payer: Medicare Other | Admitting: Internal Medicine

## 2018-10-08 VITALS — BP 136/70 | HR 94 | Ht 73.25 in | Wt 209.4 lb

## 2018-10-08 DIAGNOSIS — K219 Gastro-esophageal reflux disease without esophagitis: Secondary | ICD-10-CM

## 2018-10-08 DIAGNOSIS — G4734 Idiopathic sleep related nonobstructive alveolar hypoventilation: Secondary | ICD-10-CM | POA: Diagnosis not present

## 2018-10-08 NOTE — Assessment & Plan Note (Signed)
He continues to benefit from O2- sleeping and feeling better.  Scratchy throat may reflect dryness from his meds and dry O2, or possibly occ reflux at night. Plan- continue O2 2L for sleep. Use humidifier with O2 concentrator. Reflux precautions. Biotene

## 2018-10-08 NOTE — Assessment & Plan Note (Signed)
He is aware of occasional mild reflux. Not sure if important, but I reviewed reflux precautions.

## 2018-10-08 NOTE — Patient Instructions (Signed)
Suggest you start using the humidifier on your concentrator, with distilled water.   Elevate the head of your bed a few inches to reduce the chance of reflux  Try rinsing the back of your throat with Biotene mouth rinse at bedtime  Hopefully these measures can help with the scratchy throat at night.  Continue O2 1L for sleep  Please call if we can help

## 2018-10-08 NOTE — Progress Notes (Signed)
HPI male never smoker followed for cough, mild obstructive airways disease, complicated by CAD, palpitations, old right rib fractures PFT 07/13/14- minimal obstructive airways disease, insignificant response to bronchodilator, normal lung volumes and diffusion. Echocardiogram 08/11/2013-normal LV function, normal valves, no evidence of prior MI Walk Test-04/02/2015-he walked 555 feet starting with saturation 93%, falling to a nadir of 92% on room air with no oxygen desaturation. Pulse rate 100-109, regular. ONOX- 04/25/15- qualified for sleep O2 with 50 minutes<= 88% room air .NPSG 09/29/15- WNL, AHI 3/ hr, with desat to 85%, mean sat 90.1% ----------------------------------------------------------------------  10/25/2813-73 year old male never smoker followed for cough, mild obstructive airways disease, old right rib fractures complicated by CAD, palpitations O2 1 L/sleep/[facility] FOLLOWS FOR: Pt continues wear nocturnal O2 from [place] and has concerns the machine is not working properly. Pt is having SOB as well. He admits he gets no exercise at all. Sometimes wakes at night and feels need to take a deep breath in. Sometimes during the day he is aware of dyspnea with unusual exertion. Still wears oxygen at night. It feels the same but machine is sounding different.  10/08/2018- 73 year old male never smoker followed for Chronic hypoxic respiratory / nocturnal hypoxemia, cough, mild obstructive airways disease, old right rib fractures complicated by CAD, palpitations, Insomnia/ Dr [name] O2 1 L/sleep/[facility] -----states breathing is doing "very well"; has "scratch" in throat from nighttime O2; no other issues Says he "loves" his oxygen at night. Over several months he has been waking around 2AM with annoying feeling "like ants" in his throat. Not specifically aware of associated reflux or pndrip. Has humidifier on concentrator, but never uses it. Now has peripheral neuropathy with numb feet. Prediabetic.    ROS-see HPI  + = positive Constitutional:   No-   weight loss, night sweats, fevers, chills, fatigue, lassitude. HEENT:   No-  headaches, difficulty swallowing, tooth/dental problems, sore throat,       No-  sneezing, itching, ear ache, nasal congestion, post nasal drip,  CV:  No-   chest pain, orthopnea, PND, swelling in lower extremities, anasarca,                              dizziness, +palpitations Resp: +shortness of breath with exertion or at rest.              No-   productive cough,  non-productive cough,  No- coughing up of blood.              No-   change in color of mucus.  No- wheezing.   Skin: No-   rash or lesions. GI:  No-   heartburn, indigestion, abdominal pain, nausea, vomiting,  GU: . MS:  No-   joint pain or swelling.   Neuro-     nothing unusual Psych:  No- change in mood or affect. + depression or anxiety.  No memory loss.  OBJ- Physical Exam    General- Alert, Oriented, Affect-appropriate, Distress- none acute Skin- rash-none, lesions- none, excoriation- none Lymphadenopathy- none Head- atraumatic            Eyes- Gross vision intact, PERRLA, conjunctivae and secretions clear            Ears- Hearing, canals-normal            Nose- Clear, no-Septal dev, mucus, polyps, erosion, perforation             Throat- Mallampati II/+ uvulopalatoplasty , mucosa clear ,  drainage- none, tonsils-  atrophic,  Neck- flexible , trachea midline, no stridor , thyroid nl, carotid no bruit Chest - symmetrical excursion , unlabored           Heart/CV- RRR , no murmur , no gallop  , no rub, nl s1 s2                           - JVD- none , edema- none, stasis changes- none, varices- none           Lung- clear to P&A, wheeze- none, cough- none , dullness-none, rub- none           Chest wall-; + asymmetry chest wall at sternum consistent with old rib fractures and rib cage distortion. Abd-  Br/ Gen/ Rectal- Not done, not indicated Extrem- cyanosis- none, clubbing, none, atrophy-  none, strength- nl Neuro- + right lower facial weakness

## 2018-10-10 DIAGNOSIS — H6120 Impacted cerumen, unspecified ear: Secondary | ICD-10-CM | POA: Diagnosis not present

## 2018-10-10 DIAGNOSIS — R42 Dizziness and giddiness: Secondary | ICD-10-CM | POA: Diagnosis not present

## 2018-10-10 DIAGNOSIS — E1140 Type 2 diabetes mellitus with diabetic neuropathy, unspecified: Secondary | ICD-10-CM | POA: Diagnosis not present

## 2018-10-10 DIAGNOSIS — G629 Polyneuropathy, unspecified: Secondary | ICD-10-CM | POA: Diagnosis not present

## 2018-11-04 DIAGNOSIS — L821 Other seborrheic keratosis: Secondary | ICD-10-CM | POA: Diagnosis not present

## 2018-11-04 DIAGNOSIS — D1801 Hemangioma of skin and subcutaneous tissue: Secondary | ICD-10-CM | POA: Diagnosis not present

## 2018-11-04 DIAGNOSIS — L723 Sebaceous cyst: Secondary | ICD-10-CM | POA: Diagnosis not present

## 2018-11-04 DIAGNOSIS — L820 Inflamed seborrheic keratosis: Secondary | ICD-10-CM | POA: Diagnosis not present

## 2018-11-04 DIAGNOSIS — L438 Other lichen planus: Secondary | ICD-10-CM | POA: Diagnosis not present

## 2018-11-13 DIAGNOSIS — E1140 Type 2 diabetes mellitus with diabetic neuropathy, unspecified: Secondary | ICD-10-CM | POA: Diagnosis not present

## 2018-11-13 DIAGNOSIS — N400 Enlarged prostate without lower urinary tract symptoms: Secondary | ICD-10-CM | POA: Diagnosis not present

## 2018-11-13 DIAGNOSIS — I10 Essential (primary) hypertension: Secondary | ICD-10-CM | POA: Diagnosis not present

## 2018-11-13 DIAGNOSIS — E782 Mixed hyperlipidemia: Secondary | ICD-10-CM | POA: Diagnosis not present

## 2018-11-14 ENCOUNTER — Telehealth: Payer: Self-pay

## 2018-11-14 NOTE — Telephone Encounter (Signed)
Virtual Visit Pre-Appointment Phone Call  Steps For Call:  1. Confirm consent - "In the setting of the current Covid19 crisis, you are scheduled for a (phone or video) visit with your provider on (date) at (time).  Just as we do with many in-office visits, in order for you to participate in this visit, we must obtain consent.  If you'd like, I can send this to your mychart (if signed up) or email for you to review.  Otherwise, I can obtain your verbal consent now.  All virtual visits are billed to your insurance company just like a normal visit would be.  By agreeing to a virtual visit, we'd like you to understand that the technology does not allow for your provider to perform an examination, and thus may limit your provider's ability to fully assess your condition.  Finally, though the technology is pretty good, we cannot assure that it will always work on either your or our end, and in the setting of a video visit, we may have to convert it to a phone-only visit.  In either situation, we cannot ensure that we have a secure connection.  Are you willing to proceed?"  2. Give patient instructions for WebEx download to smartphone as below if video visit  3. Advise patient to be prepared with any vital sign or heart rhythm information, their current medicines, and a piece of paper and pen handy for any instructions they may receive the day of their visit  4. Inform patient they will receive a phone call 15 minutes prior to their appointment time (may be from unknown caller ID) so they should be prepared to answer  5. Confirm that appointment type is correct in Epic appointment notes (video vs telephone)    TELEPHONE CALL NOTE  Eduardo Watts. has been deemed a candidate for a follow-up tele-health visit to limit community exposure during the Covid-19 pandemic. I spoke with the patient via phone to ensure availability of phone/video source, confirm preferred email & phone number, and  discuss instructions and expectations.  I reminded Eduardo Watts. to be prepared with any vital sign and/or heart rhythm information that could potentially be obtained via home monitoring, at the time of his visit. I reminded Eduardo Watts. to expect a phone call at the time of his visit if his visit.  Did the patient verbally acknowledge consent to treatment? yes  Gar Ponto, [facility] 11/14/2018 3:44 PM   DOWNLOADING THE [place], go to [employer] and type in WebEx in the search bar. [place] [employer], the blue/green circle. The app is free but as with any other app downloads, their phone may require them to verify saved payment information or Apple password. The patient does NOT have to create an account.  - If Android, ask patient to go to [employer] and type in WebEx in the search bar. [place] [employer], the blue/green circle. The app is free but as with any other app downloads, their phone may require them to verify saved payment information or Android password. The patient does NOT have to create an account.   CONSENT FOR TELE-HEALTH VISIT - PLEASE REVIEW  I hereby voluntarily request, consent and authorize [facility] and its employed or contracted physicians, physician assistants, nurse practitioners or other licensed health care professionals (the Practitioner), to provide me with telemedicine health care services (the "Services") as deemed necessary by  the treating Practitioner. I acknowledge and consent to receive the Services by the Practitioner via telemedicine. I understand that the telemedicine visit will involve communicating with the Practitioner through live audiovisual communication technology and the disclosure of certain medical information by electronic transmission. I acknowledge that I have been given the opportunity to request an in-person assessment or other available alternative prior to the  telemedicine visit and am voluntarily participating in the telemedicine visit.  I understand that I have the right to withhold or withdraw my consent to the use of telemedicine in the course of my care at any time, without affecting my right to future care or treatment, and that the Practitioner or I may terminate the telemedicine visit at any time. I understand that I have the right to inspect all information obtained and/or recorded in the course of the telemedicine visit and may receive copies of available information for a reasonable fee.  I understand that some of the potential risks of receiving the Services via telemedicine include:  Marland Kitchen Delay or interruption in medical evaluation due to technological equipment failure or disruption; . Information transmitted may not be sufficient (e.g. poor resolution of images) to allow for appropriate medical decision making by the Practitioner; and/or  . In rare instances, security protocols could fail, causing a breach of personal health information.  Furthermore, I acknowledge that it is my responsibility to provide information about my medical history, conditions and care that is complete and accurate to the best of my ability. I acknowledge that Practitioner's advice, recommendations, and/or decision may be based on factors not within their control, such as incomplete or inaccurate data provided by me or distortions of diagnostic images or specimens that may result from electronic transmissions. I understand that the practice of medicine is not an exact science and that Practitioner makes no warranties or guarantees regarding treatment outcomes. I acknowledge that I will receive a copy of this consent concurrently upon execution via email to the email address I last provided but may also request a printed copy by calling the office of [facility].    I understand that my insurance will be billed for this visit.   I have read or had this consent read to me.  . I understand the contents of this consent, which adequately explains the benefits and risks of the Services being provided via telemedicine.  . I have been provided ample opportunity to ask questions regarding this consent and the Services and have had my questions answered to my satisfaction. . I give my informed consent for the services to be provided through the use of telemedicine in my medical care  By participating in this telemedicine visit I agree to the above.

## 2018-11-21 ENCOUNTER — Other Ambulatory Visit: Payer: Self-pay | Admitting: Psychiatry

## 2018-11-21 NOTE — Progress Notes (Signed)
Virtual Visit via Video Note   This visit type was conducted due to national recommendations for restrictions regarding the COVID-19 Pandemic (e.g. social distancing) in an effort to limit this patient's exposure and mitigate transmission in our community.  Due to his co-morbid illnesses, this patient is at least at moderate risk for complications without adequate follow up.  This format is felt to be most appropriate for this patient at this time.  All issues noted in this document were discussed and addressed.  A limited physical exam was performed with this format.  Please refer to the patient's chart for his consent to telehealth for [facility].   Unable to do video visit due to lack of smart phone.  Evaluation Performed:  Follow-up visit  Date:  11/22/2018   ID:  Eduardo Watts., DOB 01-15-1946, MRN [id number]  Patient Location: Home Provider Location: Home  PCP:  [name], MD  Cardiologist:  Larae Grooms, MD  Electrophysiologist:  None   Chief Complaint:  CAD  History of Present Illness:    Eduardo Watts. is a 73 y.o. male who had a heart cath in 9/15. This showed moderate RCA disease. There was no significant obstructive disease. His ejection fraction was low normal. He had a pulmonary eval for cough. This is improved with less Coreg.   Hewas concernedin the pastabout AFib and stroke risk. He feltthat he haddaily palpitation. He hit his chest to help the palpitations resolve. Prior w/u for AFib has been negative.  He had given up alcohol since the day of the cath in 2015 but restarted alcohol in 2017.   He has an iron overload syndrome as well.   Hestartednighttime oxygen in 2017- but only uses it half the night, 2L/min. He had another sleep study in 2016 and it was normal. No need for it during the day. 94% during the day. 88% at night. Prescribed by Dr. [name].  He has decreased his alcohol intake to 2 glasses a day,  down from a bottle a day.  He has maintained this level.  He is working with Dr. [name] to decrease his cravings for alcohol.  Acampro was started for this.  The patient does not have symptoms concerning for COVID-19 infection (fever, chills, cough, or new shortness of breath).   Denies : Chest pain. Dizziness. Leg edema. Nitroglycerin use. Orthopnea. Palpitations. Paroxysmal nocturnal dyspnea. Shortness of breath. Syncope.    He saw Dr. Harrington Challenger. Diagnosed with peripheral neuropathy.  Started on Gabapentin.      Past Medical History:  Diagnosis Date  . Asthma    as a child  . Depression    sees Dr [name]  . History of BPH    Dr.Dahlstedt  . Hyperlipidemia   . Sleep disturbances    Past Surgical History:  Procedure Laterality Date  . LEFT HEART CATHETERIZATION WITH CORONARY ANGIOGRAM N/A 04/30/2014   Procedure: LEFT HEART CATHETERIZATION WITH CORONARY ANGIOGRAM;  Surgeon: [name], MD;  Location: [facility] CATH LAB;  Service: Cardiovascular;  Laterality: N/A;  . TONSILLECTOMY       Current Meds  Medication Sig  . alfuzosin (UROXATRAL) 10 MG 24 hr tablet Take 10 mg by mouth daily.  . B Complex Vitamins (B COMPLEX PO) Take 1 tablet by mouth daily.   Marland Kitchen CALCIUM PO Take 1 tablet by mouth daily.   . carvedilol (COREG) 3.125 MG tablet Take 3.125 mg by mouth daily. Takes 1/2 tablet by mouth daily  . Cholecalciferol (  VITAMIN D3) 5000 units CAPS Take 1 capsule by mouth daily.  . cyclobenzaprine (FLEXERIL) 10 MG tablet Take 1 tablet (10 mg total) by mouth at bedtime.  Marland Kitchen ezetimibe (ZETIA) 10 MG tablet Take 1 tablet (10 mg total) by mouth daily.  . finasteride (PROSCAR) 5 MG tablet Take 1 tablet by mouth every other night  . fluticasone (FLONASE) 50 MCG/ACT nasal spray Place 1 spray into both nostrils daily.   Marland Kitchen gabapentin (NEURONTIN) 300 MG capsule Take 300 mg by mouth daily.  . hydrOXYzine (ATARAX/VISTARIL) 10 MG tablet Take 1 tablet (10 mg total) by mouth 3 (three) times daily as needed  for anxiety.  Marland Kitchen KRILL OIL PO Take 500 mg by mouth daily.   Marland Kitchen LORazepam (ATIVAN) 0.5 MG tablet Take 2 tablets by mouth at bedtime.  Marland Kitchen MAGNESIUM PO Take 1 tablet by mouth daily.   . Omega-3 Fatty Acids (FISH OIL) 1000 MG CAPS Take 1 capsule by mouth daily.  . rosuvastatin (CRESTOR) 5 MG tablet Take 5 mg by mouth every other day.     Allergies:   Zocor [simvastatin] and Trazodone and nefazodone   Social History   Tobacco Use  . Smoking status: Never Smoker  . Smokeless tobacco: Never Used  Substance Use Topics  . Alcohol use: No    Alcohol/week: 3.0 standard drinks    Types: 3 Glasses of wine per week    Comment: pt has not had any wine since 04/2014  . Drug use: No     Family Hx: The patient's family history includes Cancer - Prostate in his father; Dementia in his mother; Hyperlipidemia in his sister; Stroke in his mother. There is no history of Heart attack or Hypertension.  ROS:   Please see the history of present illness.    Foot numbness All other systems reviewed and are negative.   Prior CV studies:   The following studies were reviewed today: ECG from 2/20 reviewed   Labs/Other Tests and Data Reviewed:    EKG:  An ECG dated 09/23/2018 was personally reviewed today and demonstrated:  NSR, inverted T waves in anterior precordium- unchanged from prior  Recent Labs: No results found for requested labs within last 8760 hours.   Recent Lipid Panel Lab Results  Component Value Date/Time   CHOL 224 (H) 05/24/2017 08:45 AM   TRIG 141 05/24/2017 08:45 AM   HDL 57 05/24/2017 08:45 AM   CHOLHDL 3.9 05/24/2017 08:45 AM   LDLCALC 139 (H) 05/24/2017 08:45 AM    Wt Readings from Last 3 Encounters:  11/22/18 211 lb (95.7 kg)  10/08/18 209 lb 6.4 oz (95 kg)  10/10/17 213 lb 3.2 oz (96.7 kg)     Objective:    Vital Signs:  Ht 6' 1.25" (1.861 m)   Wt 211 lb (95.7 kg)   SpO2 95%   BMI 27.65 kg/m    VITAL SIGNS:  reviewed GEN:  no acute distress RESPIRATORY:  no  apparent shortness of breath exam limited by phone format  ASSESSMENT & PLAN:    1. CAD: No angina.  Continue aggressive secondary prevention.  Increase exercise.   2. Pre DM: A1C 6.7 in 09/2018. He requests some onfo about dietary changes.  Will send him some information.  3. Alcohol abuse: He continues to decrease intake, 2 drink/day.  4. Hyperlipidemia: The current medical regimen is effective;  continue present plan and medications. 5. Abnormal ECG: No change from prior.  COVID-19 Education: The signs and symptoms of COVID-19 were  discussed with the patient and how to seek care for testing (follow up with PCP or arrange E-visit).  The importance of social distancing was discussed today.  Time:   Today, I have spent 25 minutes with the patient with telehealth technology discussing the above problems.     Medication Adjustments/Labs and Tests Ordered: Current medicines are reviewed at length with the patient today.  Concerns regarding medicines are outlined above.   Tests Ordered: No orders of the defined types were placed in this encounter.   Medication Changes: No orders of the defined types were placed in this encounter.   Disposition:  Follow up in 6 month(s)  Signed, Larae Grooms, MD  11/22/2018 8:47 AM    [facility]

## 2018-11-22 ENCOUNTER — Telehealth (INDEPENDENT_AMBULATORY_CARE_PROVIDER_SITE_OTHER): Payer: Medicare Other | Admitting: Interventional Cardiology

## 2018-11-22 ENCOUNTER — Other Ambulatory Visit: Payer: Self-pay

## 2018-11-22 ENCOUNTER — Encounter: Payer: Self-pay | Admitting: Interventional Cardiology

## 2018-11-22 ENCOUNTER — Encounter

## 2018-11-22 VITALS — Ht 73.25 in | Wt 211.0 lb

## 2018-11-22 DIAGNOSIS — E785 Hyperlipidemia, unspecified: Secondary | ICD-10-CM

## 2018-11-22 DIAGNOSIS — I25119 Atherosclerotic heart disease of native coronary artery with unspecified angina pectoris: Secondary | ICD-10-CM | POA: Diagnosis not present

## 2018-11-22 DIAGNOSIS — Z7289 Other problems related to lifestyle: Secondary | ICD-10-CM | POA: Diagnosis not present

## 2018-11-22 DIAGNOSIS — Z789 Other specified health status: Secondary | ICD-10-CM

## 2018-11-22 DIAGNOSIS — R7303 Prediabetes: Secondary | ICD-10-CM | POA: Diagnosis not present

## 2018-11-22 DIAGNOSIS — E782 Mixed hyperlipidemia: Secondary | ICD-10-CM

## 2018-11-22 NOTE — Patient Instructions (Signed)
Medication Instructions:  Your physician recommends that you continue on your current medications as directed. Please refer to the Current Medication list given to you today.  If you need a refill on your cardiac medications before your next appointment, please call your pharmacy.   Lab work: None Ordered  If you have labs (blood work) drawn today and your tests are completely normal, you will receive your results only by: Marland Kitchen MyChart Message (if you have MyChart) OR . A paper copy in the mail If you have any lab test that is abnormal or we need to change your treatment, we will call you to review the results.  Testing/Procedures: None ordered  Follow-Up: At [facility], you and your health needs are our priority.  As part of our continuing mission to provide you with exceptional heart care, we have created designated Provider Care Teams.  These Care Teams include your primary Cardiologist (physician) and Advanced Practice Providers (APPs -  Physician Assistants and Nurse Practitioners) who all work together to provide you with the care you need, when you need it. . You will need a follow up appointment in 6 months.  Please call our office 2 months in advance to schedule this appointment.  You may see Casandra Doffing, MD or one of the following Advanced Practice Providers on your designated Care Team:   . Lyda Jester, PA-C . Dayna Dunn, PA-C . Ermalinda Barrios, PA-C  Any Other Special Instructions Will Be Listed Below (If Applicable).   Diabetes Mellitus and Nutrition, Adult When you have diabetes (diabetes mellitus), it is very important to have healthy eating habits because your blood sugar (glucose) levels are greatly affected by what you eat and drink. Eating healthy foods in the appropriate amounts, at about the same times every day, can help you:  Control your blood glucose.  Lower your risk of heart disease.  Improve your blood pressure.  Reach or maintain a healthy weight.  Every person with diabetes is different, and each person has different needs for a meal plan. Your health care provider may recommend that you work with a diet and nutrition specialist (dietitian) to make a meal plan that is best for you. Your meal plan may vary depending on factors such as:  The calories you need.  The medicines you take.  Your weight.  Your blood glucose, blood pressure, and cholesterol levels.  Your activity level.  Other health conditions you have, such as heart or kidney disease. How do carbohydrates affect me? Carbohydrates, also called carbs, affect your blood glucose level more than any other type of food. Eating carbs naturally raises the amount of glucose in your blood. Carb counting is a method for keeping track of how many carbs you eat. Counting carbs is important to keep your blood glucose at a healthy level, especially if you use insulin or take certain oral diabetes medicines. It is important to know how many carbs you can safely have in each meal. This is different for every person. Your dietitian can help you calculate how many carbs you should have at each meal and for each snack. Foods that contain carbs include:  Bread, cereal, rice, pasta, and crackers.  Potatoes and corn.  Peas, beans, and lentils.  Milk and yogurt.  Fruit and juice.  Desserts, such as cakes, cookies, ice cream, and candy. How does alcohol affect me? Alcohol can cause a sudden decrease in blood glucose (hypoglycemia), especially if you use insulin or take certain oral diabetes medicines. Hypoglycemia  can be a life-threatening condition. Symptoms of hypoglycemia (sleepiness, dizziness, and confusion) are similar to symptoms of having too much alcohol. If your health care provider says that alcohol is safe for you, follow these guidelines:  Limit alcohol intake to no more than 1 drink per day for nonpregnant women and 2 drinks per day for men. One drink equals 12 oz of beer, 5  oz of wine, or 1 oz of hard liquor.  Do not drink on an empty stomach.  Keep yourself hydrated with water, diet soda, or unsweetened iced tea.  Keep in mind that regular soda, juice, and other mixers may contain a lot of sugar and must be counted as carbs. What are tips for following this plan?  Reading food labels  Start by checking the serving size on the "Nutrition Facts" label of packaged foods and drinks. The amount of calories, carbs, fats, and other nutrients listed on the label is based on one serving of the item. Many items contain more than one serving per package.  Check the total grams (g) of carbs in one serving. You can calculate the number of servings of carbs in one serving by dividing the total carbs by 15. For example, if a food has 30 g of total carbs, it would be equal to 2 servings of carbs.  Check the number of grams (g) of saturated and trans fats in one serving. Choose foods that have low or no amount of these fats.  Check the number of milligrams (mg) of salt (sodium) in one serving. Most people should limit total sodium intake to less than 2,300 mg per day.  Always check the nutrition information of foods labeled as "low-fat" or "nonfat". These foods may be higher in added sugar or refined carbs and should be avoided.  Talk to your dietitian to identify your daily goals for nutrients listed on the label. Shopping  Avoid buying canned, premade, or processed foods. These foods tend to be high in fat, sodium, and added sugar.  Shop around the outside edge of the grocery store. This includes fresh fruits and vegetables, bulk grains, fresh meats, and fresh dairy. Cooking  Use low-heat cooking methods, such as baking, instead of high-heat cooking methods like deep frying.  Cook using healthy oils, such as olive, canola, or sunflower oil.  Avoid cooking with butter, cream, or high-fat meats. Meal planning  Eat meals and snacks regularly, preferably at the same  times every day. Avoid going long periods of time without eating.  Eat foods high in fiber, such as fresh fruits, vegetables, beans, and whole grains. Talk to your dietitian about how many servings of carbs you can eat at each meal.  Eat 4-6 ounces (oz) of lean protein each day, such as lean meat, chicken, fish, eggs, or tofu. One oz of lean protein is equal to: ? 1 oz of meat, chicken, or fish. ? 1 egg. ?  cup of tofu.  Eat some foods each day that contain healthy fats, such as avocado, nuts, seeds, and fish. Lifestyle  Check your blood glucose regularly.  Exercise regularly as told by your health care provider. This may include: ? 150 minutes of moderate-intensity or vigorous-intensity exercise each week. This could be brisk walking, biking, or water aerobics. ? Stretching and doing strength exercises, such as yoga or weightlifting, at least 2 times a week.  Take medicines as told by your health care provider.  Do not use any products that contain nicotine or tobacco, such as  cigarettes and e-cigarettes. If you need help quitting, ask your health care provider.  Work with a Social worker or diabetes educator to identify strategies to manage stress and any emotional and social challenges. Questions to ask a health care provider  Do I need to meet with a diabetes educator?  Do I need to meet with a dietitian?  What number can I call if I have questions?  When are the best times to check my blood glucose? Where to find more information:  American Diabetes Association: diabetes.org  Academy of Nutrition and Dietetics: www.eatright.[employer] of Diabetes and Digestive and Kidney Diseases (NIH): [employer] Summary  A healthy meal plan will help you control your blood glucose and maintain a healthy lifestyle.  Working with a diet and nutrition specialist (dietitian) can help you make a meal plan that is best for you.  Keep in mind that carbohydrates (carbs)  and alcohol have immediate effects on your blood glucose levels. It is important to count carbs and to use alcohol carefully. This information is not intended to replace advice given to you by your health care provider. Make sure you discuss any questions you have with your health care provider. Document Released: 04/20/2005 Document Revised: 02/21/2017 Document Reviewed: 08/28/2016 Elsevier Interactive Patient Education  2019 [employer].

## 2018-11-22 NOTE — Telephone Encounter (Signed)
Call pt to confirm taking, not on med list

## 2018-12-24 ENCOUNTER — Other Ambulatory Visit: Payer: Self-pay

## 2018-12-24 ENCOUNTER — Ambulatory Visit (INDEPENDENT_AMBULATORY_CARE_PROVIDER_SITE_OTHER): Payer: Medicare Other | Admitting: Psychiatry

## 2018-12-24 ENCOUNTER — Encounter: Payer: Self-pay | Admitting: Psychiatry

## 2018-12-24 DIAGNOSIS — F1011 Alcohol abuse, in remission: Secondary | ICD-10-CM | POA: Diagnosis not present

## 2018-12-24 DIAGNOSIS — F5105 Insomnia due to other mental disorder: Secondary | ICD-10-CM | POA: Diagnosis not present

## 2018-12-24 DIAGNOSIS — F99 Mental disorder, not otherwise specified: Secondary | ICD-10-CM | POA: Diagnosis not present

## 2018-12-24 DIAGNOSIS — F411 Generalized anxiety disorder: Secondary | ICD-10-CM | POA: Diagnosis not present

## 2018-12-24 MED ORDER — LORAZEPAM 0.5 MG PO TABS: 1.0000 mg | ORAL_TABLET | Freq: Every day | ORAL | 5 refills | Status: DC

## 2018-12-24 NOTE — Progress Notes (Signed)
Eduardo Watts [id number] 1946/04/16 73 y.o.  Virtual Visit via Telephone Note  I connected with pt by telephone and verified that I am speaking with the correct person using two identifiers.   I discussed the limitations, risks, security and privacy concerns of performing an evaluation and management service by telephone and the availability of in person appointments. I also discussed with the patient that there may be a patient responsible charge related to this service. The patient expressed understanding and agreed to proceed.  I discussed the assessment and treatment plan with the patient. The patient was provided an opportunity to ask questions and all were answered. The patient agreed with the plan and demonstrated an understanding of the instructions.   The patient was advised to call back or seek an in-person evaluation if the symptoms worsen or if the condition fails to improve as anticipated.  I provided 15 minutes of non-face-to-face time during this encounter. The call started at 915 and ended at 930. The patient was located at home and the provider was located office.  Subjective:   Patient ID:  Eduardo Watts. is a 73 y.o. (DOB September 23, 1945) male.  Chief Complaint:  Chief Complaint  Patient presents with  . Follow-up    Medication Management  . Anxiety  . Sleeping Problem    Anxiety  Patient reports no confusion, decreased concentration, nervous/anxious behavior or suicidal ideas.    [name] presents to the office today for follow-up of sleep, anxiety and alcohol abuse.  Last seen January.  No meds changed.  W afraid to go to grocery store. Overall he's doing well.  Occ down days.  Still haven't patched things up with sister completely.  Things cold with sister. Prayed to get into control and I think I'm there  RE alcohol and dealing with sister.  Alcohol in remission.   Some concern about depending on lorazepam for sleep.  Can't sleep  without it.  Do SE.  On it since 2012.  Covid hurthing business.  Patient reports stable mood and denies depressed or irritable moods usually.  Patient denies any recent difficulty with anxiety.  Patient denies difficulty with sleep initiation or maintenance. Ativan 0.5 mg and hydroxyzine help sleep.  8 hours.   Denies appetite disturbance.  Patient reports that energy and motivation have been good.  Patient denies any difficulty with concentration.  Patient denies any suicidal ideation.  Past Psychiatric Medication Trials:     Review of Systems:  Review of Systems  Neurological: Positive for numbness. Negative for tremors and weakness.  Psychiatric/Behavioral: Negative for agitation, behavioral problems, confusion, decreased concentration, dysphoric mood, hallucinations, self-injury, sleep disturbance and suicidal ideas. The patient is not nervous/anxious and is not hyperactive.     Medications: I have reviewed the patient's current medications.  Current Outpatient Medications  Medication Sig Dispense Refill  . alfuzosin (UROXATRAL) 10 MG 24 hr tablet Take 10 mg by mouth daily.    . B Complex Vitamins (B COMPLEX PO) Take 1 tablet by mouth daily.     Marland Kitchen CALCIUM PO Take 1 tablet by mouth daily.     . carvedilol (COREG) 3.125 MG tablet Take 3.125 mg by mouth daily. Takes 1/2 tablet by mouth daily    . Cholecalciferol (VITAMIN D3) 5000 units CAPS Take 1 capsule by mouth daily.    . cyclobenzaprine (FLEXERIL) 10 MG tablet Take 1 tablet (10 mg total) by mouth at bedtime. 30 tablet 11  . ezetimibe (ZETIA) 10  MG tablet Take 1 tablet (10 mg total) by mouth daily. 30 tablet 11  . finasteride (PROSCAR) 5 MG tablet Take 1 tablet by mouth every other night    . fluticasone (FLONASE) 50 MCG/ACT nasal spray Place 1 spray into both nostrils daily.     Marland Kitchen gabapentin (NEURONTIN) 300 MG capsule Take 300 mg by mouth daily.    . hydrOXYzine (ATARAX/VISTARIL) 10 MG tablet Take 1 tablet (10 mg total) by mouth 3  (three) times daily as needed for anxiety. 90 tablet 5  . KRILL OIL PO Take 500 mg by mouth daily.     Marland Kitchen LORazepam (ATIVAN) 0.5 MG tablet Take 2 tablets by mouth at bedtime.    . naltrexone (DEPADE) 50 MG tablet Take 1 tablet by mouth in the evening 30 tablet 0  . Omega-3 Fatty Acids (FISH OIL) 1000 MG CAPS Take 1 capsule by mouth daily.    . rosuvastatin (CRESTOR) 5 MG tablet Take 5 mg by mouth every other day.     No current facility-administered medications for this visit.     Medication Side Effects: None  Allergies:  Allergies  Allergen Reactions  . Zocor [Simvastatin] Other (See Comments)    Muscle aches  . Trazodone And Nefazodone Other (See Comments)    "crazy"    Past Medical History:  Diagnosis Date  . Asthma    as a child  . Depression    sees Dr [name]  . History of BPH    Dr.Dahlstedt  . Hyperlipidemia   . Sleep disturbances     Family History  Problem Relation Age of Onset  . Dementia Mother   . Stroke Mother   . Cancer - Prostate Father   . Hyperlipidemia Sister        high cholestrol  . Heart attack Neg Hx   . Hypertension Neg Hx     Social History   Socioeconomic History  . Marital status: Married    Spouse name: Not on file  . Number of children: Not on file  . Years of education: Not on file  . Highest education level: Not on file  Occupational History  . Not on file  Social Needs  . Financial resource strain: Not on file  . Food insecurity:    Worry: Not on file    Inability: Not on file  . Transportation needs:    Medical: Not on file    Non-medical: Not on file  Tobacco Use  . Smoking status: Never Smoker  . Smokeless tobacco: Never Used  Substance and Sexual Activity  . Alcohol use: No    Alcohol/week: 3.0 standard drinks    Types: 3 Glasses of wine per week    Comment: pt has not had any wine since 04/2014  . Drug use: No  . Sexual activity: Not on file  Lifestyle  . Physical activity:    Days per week: Not on file     Minutes per session: Not on file  . Stress: Not on file  Relationships  . Social connections:    Talks on phone: Not on file    Gets together: Not on file    Attends religious service: Not on file    Active member of club or organization: Not on file    Attends meetings of clubs or organizations: Not on file    Relationship status: Not on file  . Intimate partner violence:    Fear of current or ex partner: Not on file  Emotionally abused: Not on file    Physically abused: Not on file    Forced sexual activity: Not on file  Other Topics Concern  . Not on file  Social History Narrative   Never smoked   Alcohol yes -drinking on the weekends/2 glasses of wine with dinner   Caffeine- yes   Recreational drug use -No   Diet- fruits and vegetables,less fried food more pasta   Exercise- decrease to fatigue ,does yard work,and  walks occasionally    Occupation -employed Press photographer   Married 1 daughter/ 1 granddaughter   Goes by [employer]" loves animals    Past Medical History, Surgical history, Social history, and Family history were reviewed and updated as appropriate.   Please see review of systems for further details on the patient's review from today.   Objective:   Physical Exam:  There were no vitals taken for this visit.  Physical Exam Constitutional:      General: He is not in acute distress.    Appearance: He is well-developed.  Musculoskeletal:        General: No deformity.  Neurological:     Mental Status: He is alert and oriented to person, place, and time.     Motor: No tremor.     Coordination: Coordination normal.     Gait: Gait normal.  Psychiatric:        Attention and Perception: Attention and perception normal.        Mood and Affect: Mood is not anxious or depressed. Affect is not labile, blunt, angry or inappropriate.        Speech: Speech normal.        Behavior: Behavior normal.        Thought Content: Thought content normal. Thought content does not include  homicidal or suicidal ideation. Thought content does not include homicidal or suicidal plan.        Cognition and Memory: Cognition normal.        Judgment: Judgment normal.     Comments: Insight intact. No auditory or visual hallucinations. No delusions.      Lab Review:     Component Value Date/Time   NA 136 05/24/2017 0845   K 4.6 05/24/2017 0845   CL 97 05/24/2017 0845   CO2 18 (L) 05/24/2017 0845   GLUCOSE 148 (H) 05/24/2017 0845   GLUCOSE 140 (H) 04/21/2014 0917   BUN 11 05/24/2017 0845   CREATININE 0.86 05/24/2017 0845   CALCIUM 9.3 05/24/2017 0845   PROT 6.9 05/24/2017 0845   ALBUMIN 4.5 05/24/2017 0845   AST 81 (H) 05/24/2017 0845   ALT 71 (H) 05/24/2017 0845   ALKPHOS 75 05/24/2017 0845   BILITOT 1.1 05/24/2017 0845   GFRNONAA 86 05/24/2017 0845   GFRAA 99 05/24/2017 0845       Component Value Date/Time   WBC 7.6 04/21/2014 0917   RBC 4.60 04/21/2014 0917   HGB 15.5 04/21/2014 0917   HCT 46.0 04/21/2014 0917   PLT 235.0 04/21/2014 0917   MCV 100.0 04/21/2014 0917   MCH 31.8 07/02/2012 2005   MCHC 33.7 04/21/2014 0917   RDW 13.4 04/21/2014 0917   LYMPHSABS 1.1 04/21/2014 0917   MONOABS 0.5 04/21/2014 0917   EOSABS 0.2 04/21/2014 0917   BASOSABS 0.0 04/21/2014 0917    No results found for: POCLITH, LITHIUM   No results found for: PHENYTOIN, PHENOBARB, VALPROATE, CBMZ   .res Assessment: Plan:    Generalized anxiety disorder  Insomnia due to other mental  disorder  Mild alcohol abuse in sustained remission   Disc sleep med options including no meds.  Wife complains about him taking sleep meds.  Chronic insomnia otherwise.  Takes Flexeril every other day and doesn't take hydroxyzine on that night alternates them.  We discussed the short-term risks associated with benzodiazepines including sedation and increased fall risk among others.  Discussed long-term side effect risk including dependence, potential withdrawal symptoms, and the potential eventual  dose-related risk of dementia.  Also discussed the consequences of poor sleep on brain and body health.  Also some risk from hydroxyzine also.  He's doing well without SE so no changes.   Sleep hygiene.  Enc sobriety to continue.  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  He is not markedly depressed nor markedly anxious at this time.  He does need the medications for insomnia as noted.  No med changes today  FU 4 mos  Lynder Parents, MD, DFAPA     Please see After Visit Summary for patient specific instructions.  Future Appointments  Date Time Provider [place]  10/08/2019  9:00 AM Baird Lyons D, MD [facility] None    No orders of the defined types were placed in this encounter.     -------------------------------

## 2019-01-01 DIAGNOSIS — I10 Essential (primary) hypertension: Secondary | ICD-10-CM | POA: Diagnosis not present

## 2019-01-01 DIAGNOSIS — N400 Enlarged prostate without lower urinary tract symptoms: Secondary | ICD-10-CM | POA: Diagnosis not present

## 2019-01-01 DIAGNOSIS — E782 Mixed hyperlipidemia: Secondary | ICD-10-CM | POA: Diagnosis not present

## 2019-01-01 DIAGNOSIS — E1140 Type 2 diabetes mellitus with diabetic neuropathy, unspecified: Secondary | ICD-10-CM | POA: Diagnosis not present

## 2019-01-22 DIAGNOSIS — E1140 Type 2 diabetes mellitus with diabetic neuropathy, unspecified: Secondary | ICD-10-CM | POA: Diagnosis not present

## 2019-01-22 DIAGNOSIS — E782 Mixed hyperlipidemia: Secondary | ICD-10-CM | POA: Diagnosis not present

## 2019-01-22 DIAGNOSIS — G629 Polyneuropathy, unspecified: Secondary | ICD-10-CM | POA: Diagnosis not present

## 2019-01-24 DIAGNOSIS — N400 Enlarged prostate without lower urinary tract symptoms: Secondary | ICD-10-CM | POA: Diagnosis not present

## 2019-01-24 DIAGNOSIS — E782 Mixed hyperlipidemia: Secondary | ICD-10-CM | POA: Diagnosis not present

## 2019-01-24 DIAGNOSIS — E1140 Type 2 diabetes mellitus with diabetic neuropathy, unspecified: Secondary | ICD-10-CM | POA: Diagnosis not present

## 2019-01-24 DIAGNOSIS — I10 Essential (primary) hypertension: Secondary | ICD-10-CM | POA: Diagnosis not present

## 2019-01-30 DIAGNOSIS — R7303 Prediabetes: Secondary | ICD-10-CM | POA: Diagnosis not present

## 2019-01-30 DIAGNOSIS — H5213 Myopia, bilateral: Secondary | ICD-10-CM | POA: Diagnosis not present

## 2019-01-30 DIAGNOSIS — H2513 Age-related nuclear cataract, bilateral: Secondary | ICD-10-CM | POA: Diagnosis not present

## 2019-01-30 DIAGNOSIS — H52203 Unspecified astigmatism, bilateral: Secondary | ICD-10-CM | POA: Diagnosis not present

## 2019-01-30 DIAGNOSIS — H25013 Cortical age-related cataract, bilateral: Secondary | ICD-10-CM | POA: Diagnosis not present

## 2019-01-30 DIAGNOSIS — H524 Presbyopia: Secondary | ICD-10-CM | POA: Diagnosis not present

## 2019-02-21 DIAGNOSIS — E782 Mixed hyperlipidemia: Secondary | ICD-10-CM | POA: Diagnosis not present

## 2019-02-21 DIAGNOSIS — I10 Essential (primary) hypertension: Secondary | ICD-10-CM | POA: Diagnosis not present

## 2019-02-21 DIAGNOSIS — E1140 Type 2 diabetes mellitus with diabetic neuropathy, unspecified: Secondary | ICD-10-CM | POA: Diagnosis not present

## 2019-02-21 DIAGNOSIS — N400 Enlarged prostate without lower urinary tract symptoms: Secondary | ICD-10-CM | POA: Diagnosis not present

## 2019-02-25 ENCOUNTER — Other Ambulatory Visit: Payer: Self-pay | Admitting: Psychiatry

## 2019-03-27 ENCOUNTER — Ambulatory Visit (INDEPENDENT_AMBULATORY_CARE_PROVIDER_SITE_OTHER): Payer: Medicare Other | Admitting: Psychiatry

## 2019-03-27 ENCOUNTER — Encounter: Payer: Self-pay | Admitting: Psychiatry

## 2019-03-27 ENCOUNTER — Other Ambulatory Visit: Payer: Self-pay

## 2019-03-27 DIAGNOSIS — M545 Low back pain, unspecified: Secondary | ICD-10-CM

## 2019-03-27 DIAGNOSIS — I25119 Atherosclerotic heart disease of native coronary artery with unspecified angina pectoris: Secondary | ICD-10-CM | POA: Diagnosis not present

## 2019-03-27 DIAGNOSIS — F99 Mental disorder, not otherwise specified: Secondary | ICD-10-CM

## 2019-03-27 DIAGNOSIS — F411 Generalized anxiety disorder: Secondary | ICD-10-CM

## 2019-03-27 DIAGNOSIS — F5105 Insomnia due to other mental disorder: Secondary | ICD-10-CM | POA: Diagnosis not present

## 2019-03-27 MED ORDER — CYCLOBENZAPRINE HCL 10 MG PO TABS: 10.0000 mg | ORAL_TABLET | Freq: Every day | ORAL | 11 refills | Status: DC

## 2019-03-27 MED ORDER — LORAZEPAM 0.5 MG PO TABS: 1.0000 mg | ORAL_TABLET | Freq: Every day | ORAL | 5 refills | Status: DC

## 2019-03-27 MED ORDER — HYDROXYZINE HCL 10 MG PO TABS: 10.0000 mg | ORAL_TABLET | Freq: Three times a day (TID) | ORAL | 5 refills | Status: DC | PRN

## 2019-03-27 NOTE — Progress Notes (Signed)
Eduardo Watts [id number] 1946-01-03 73 y.o.   Subjective:   Patient ID:  Eduardo Watts. is a 73 y.o. (DOB 02-02-1946) male.  Chief Complaint:  Chief Complaint  Patient presents with  . Follow-up    Medication Management  . Anxiety    Medication Management    Anxiety Patient reports no confusion, decreased concentration, nervous/anxious behavior or suicidal ideas.    Eduardo Watts. presents to the office today for follow-up of sleep, anxiety and alcohol abuse.  Last seen Dec 24, 2018.  No meds changed.  He was not significantly depressed or anxious but was needing medication for insomnia.  GD taking piano lessons from [employer] and doing well.  In 4th grade.  Patient history of TV news and commercials.  Doing fine overall, but neuropathy in feet worries him.  DM 2 but controlled generally.  Working to reduce sugar.  Anxiety is manageable.    Moderate alcohol 2-3 glasses but denies excess. No complications except with spouse.   W afraid to go to grocery store. Overall he's doing well.  Occ down days.  Still haven't patched things up with sister completely.  Things cold with sister. Prayed to get into control and I think I'm there  RE alcohol and dealing with sister.   Some concern about depending on lorazepam for sleep.  Can't sleep without it.  Do SE.  On it since 2012.  Covid hurthing business.  Patient reports stable mood and denies depressed or irritable moods usually.  Patient denies any recent difficulty with anxiety.  Patient denies difficulty with sleep initiation or maintenance. Ativan 1 mg and hydroxyzine help sleep.  Some mild EMA.  8 hours.   Denies appetite disturbance.  Patient reports that energy, appetite,  and motivation have been good.  Patient denies any difficulty with concentration.  Patient denies any suicidal ideation.  Past Psychiatric Medication Trials:  Ambien felll out of bed, Ativan, Flexeril, Belsomra, hydrozyzine, Xanax hives,  Prosom, trazodone NR, No Seroquel DT B 's suicide, Lithium, fluoxetine, Klonopin SE, topiramate, propranolol, Librium detox, Naltrexone   Review of Systems:  Review of Systems  Neurological: Positive for numbness. Negative for tremors and weakness.       Neuropathy in feet affecting  balance  Psychiatric/Behavioral: Negative for agitation, behavioral problems, confusion, decreased concentration, dysphoric mood, hallucinations, self-injury, sleep disturbance and suicidal ideas. The patient is not nervous/anxious and is not hyperactive.     Medications: I have reviewed the patient's current medications.  Current Outpatient Medications  Medication Sig Dispense Refill  . alfuzosin (UROXATRAL) 10 MG 24 hr tablet Take 10 mg by mouth daily.    . B Complex Vitamins (B COMPLEX PO) Take 1 tablet by mouth daily.     Marland Kitchen CALCIUM PO Take 1 tablet by mouth daily.     . carvedilol (COREG) 3.125 MG tablet Take 3.125 mg by mouth daily. Takes 1/2 tablet by mouth daily    . Cholecalciferol (VITAMIN D3) 5000 units CAPS Take 1 capsule by mouth daily.    . cyclobenzaprine (FLEXERIL) 10 MG tablet Take 1 tablet (10 mg total) by mouth at bedtime. 30 tablet 11  . ezetimibe (ZETIA) 10 MG tablet Take 1 tablet (10 mg total) by mouth daily. 30 tablet 11  . finasteride (PROSCAR) 5 MG tablet Take 1 tablet by mouth every other night    . fluticasone (FLONASE) 50 MCG/ACT nasal spray Place 1 spray into both nostrils daily.     Marland Kitchen gabapentin (  NEURONTIN) 300 MG capsule Take 300 mg by mouth daily.    . hydrOXYzine (ATARAX/VISTARIL) 10 MG tablet Take 1 tablet (10 mg total) by mouth 3 (three) times daily as needed for anxiety. 90 tablet 5  . LORazepam (ATIVAN) 0.5 MG tablet Take 2 tablets (1 mg total) by mouth at bedtime. 60 tablet 5  . naltrexone (DEPADE) 50 MG tablet Take 1 tablet by mouth in the evening 30 tablet 0  . Omega-3 Fatty Acids (FISH OIL) 1000 MG CAPS Take 1 capsule by mouth daily.    . rosuvastatin (CRESTOR) 5 MG  tablet Take 5 mg by mouth every other day.    . zinc sulfate (ZINC-220) 220 (50 Zn) MG capsule Take 220 mg by mouth daily.     No current facility-administered medications for this visit.     Medication Side Effects: None  Allergies:  Allergies  Allergen Reactions  . Zocor [Simvastatin] Other (See Comments)    Muscle aches  . Trazodone And Nefazodone Other (See Comments)    "crazy"    Past Medical History:  Diagnosis Date  . Asthma    as a child  . Depression    sees Dr [name]  . History of BPH    Dr.Dahlstedt  . Hyperlipidemia   . Sleep disturbances     Family History  Problem Relation Age of Onset  . Dementia Mother   . Stroke Mother   . Cancer - Prostate Father   . Hyperlipidemia Sister        high cholestrol  . Heart attack Neg Hx   . Hypertension Neg Hx     Social History   Socioeconomic History  . Marital status: Married    Spouse name: Not on file  . Number of children: Not on file  . Years of education: Not on file  . Highest education level: Not on file  Occupational History  . Not on file  Social Needs  . Financial resource strain: Not on file  . Food insecurity    Worry: Not on file    Inability: Not on file  . Transportation needs    Medical: Not on file    Non-medical: Not on file  Tobacco Use  . Smoking status: Never Smoker  . Smokeless tobacco: Never Used  Substance and Sexual Activity  . Alcohol use: No    Alcohol/week: 3.0 standard drinks    Types: 3 Glasses of wine per week    Comment: pt has not had any wine since 04/2014  . Drug use: No  . Sexual activity: Not on file  Lifestyle  . Physical activity    Days per week: Not on file    Minutes per session: Not on file  . Stress: Not on file  Relationships  . Social Herbalist on phone: Not on file    Gets together: Not on file    Attends religious service: Not on file    Active member of club or organization: Not on file    Attends meetings of clubs or  organizations: Not on file    Relationship status: Not on file  . Intimate partner violence    Fear of current or ex partner: Not on file    Emotionally abused: Not on file    Physically abused: Not on file    Forced sexual activity: Not on file  Other Topics Concern  . Not on file  Social History Narrative   Never smoked  Alcohol yes -drinking on the weekends/2 glasses of wine with dinner   Caffeine- yes   Recreational drug use -No   Diet- fruits and vegetables,less fried food more pasta   Exercise- decrease to fatigue ,does yard work,and  walks occasionally    Occupation -employed Press photographer   Married 1 daughter/ 1 granddaughter   Goes by [employer]" loves animals    Past Medical History, Surgical history, Social history, and Family history were reviewed and updated as appropriate.   Please see review of systems for further details on the patient's review from today.   Objective:   Physical Exam:  There were no vitals taken for this visit.  Physical Exam Constitutional:      General: He is not in acute distress.    Appearance: He is well-developed.  Musculoskeletal:        General: No deformity.  Neurological:     Mental Status: He is alert and oriented to person, place, and time.     Motor: No tremor.     Coordination: Coordination normal.     Gait: Gait normal.  Psychiatric:        Attention and Perception: Attention and perception normal.        Mood and Affect: Mood is not anxious or depressed. Affect is not labile, blunt, angry or inappropriate.        Speech: Speech normal.        Behavior: Behavior normal.        Thought Content: Thought content normal. Thought content does not include homicidal or suicidal ideation. Thought content does not include homicidal or suicidal plan.        Cognition and Memory: Cognition normal.        Judgment: Judgment normal.     Comments: Insight intact. No auditory or visual hallucinations. No delusions.      Lab Review:      Component Value Date/Time   NA 136 05/24/2017 0845   K 4.6 05/24/2017 0845   CL 97 05/24/2017 0845   CO2 18 (L) 05/24/2017 0845   GLUCOSE 148 (H) 05/24/2017 0845   GLUCOSE 140 (H) 04/21/2014 0917   BUN 11 05/24/2017 0845   CREATININE 0.86 05/24/2017 0845   CALCIUM 9.3 05/24/2017 0845   PROT 6.9 05/24/2017 0845   ALBUMIN 4.5 05/24/2017 0845   AST 81 (H) 05/24/2017 0845   ALT 71 (H) 05/24/2017 0845   ALKPHOS 75 05/24/2017 0845   BILITOT 1.1 05/24/2017 0845   GFRNONAA 86 05/24/2017 0845   GFRAA 99 05/24/2017 0845       Component Value Date/Time   WBC 7.6 04/21/2014 0917   RBC 4.60 04/21/2014 0917   HGB 15.5 04/21/2014 0917   HCT 46.0 04/21/2014 0917   PLT 235.0 04/21/2014 0917   MCV 100.0 04/21/2014 0917   MCH 31.8 07/02/2012 2005   MCHC 33.7 04/21/2014 0917   RDW 13.4 04/21/2014 0917   LYMPHSABS 1.1 04/21/2014 0917   MONOABS 0.5 04/21/2014 0917   EOSABS 0.2 04/21/2014 0917   BASOSABS 0.0 04/21/2014 0917    No results found for: POCLITH, LITHIUM   No results found for: PHENYTOIN, PHENOBARB, VALPROATE, CBMZ   .res Assessment: Plan:    Insomnia due to other mental disorder  Generalized anxiety disorder  Bilateral low back pain without sciatica, unspecified chronicity   Disc sleep med options including no meds.  Wife complains about him taking sleep meds.  Chronic insomnia otherwise.  Takes Flexeril every other day and doesn't take hydroxyzine  on that night alternates them.  We discussed the short-term risks associated with benzodiazepines including sedation and increased fall risk among others.  Discussed long-term side effect risk including dependence, potential withdrawal symptoms, and the potential eventual dose-related risk of dementia.  Also discussed the consequences of poor sleep on brain and body health.  Also some risk from hydroxyzine also.  He's doing well without SE so no changes.    Sleep hygiene. Disc timing of sleep meds to help him stay asleep.  Disc  option increase hydroxyzine and hold flexeril or vice versa.  Flexeril helps back pain.  Enc sobriety.  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  He is not markedly depressed nor markedly anxious at this time.  He does need the medications for insomnia as noted.  No med changes today  FU 4-6 mos  Lynder Parents, MD, DFAPA     Please see After Visit Summary for patient specific instructions.  Future Appointments  Date Time Provider [place]  04/15/2019  8:00 AM Melvenia Beam, MD [facility] None  10/08/2019  9:00 AM Deneise Lever, MD [facility] None    No orders of the defined types were placed in this encounter.     -------------------------------

## 2019-04-15 ENCOUNTER — Other Ambulatory Visit: Payer: Self-pay

## 2019-04-15 ENCOUNTER — Ambulatory Visit (INDEPENDENT_AMBULATORY_CARE_PROVIDER_SITE_OTHER): Payer: Medicare Other | Admitting: Neurology

## 2019-04-15 ENCOUNTER — Encounter: Payer: Self-pay | Admitting: Neurology

## 2019-04-15 VITALS — BP 129/80 | HR 96 | Temp 97.8°F | Ht 74.0 in | Wt 212.0 lb

## 2019-04-15 DIAGNOSIS — G629 Polyneuropathy, unspecified: Secondary | ICD-10-CM | POA: Diagnosis not present

## 2019-04-15 MED ORDER — ALPHA-LIPOIC ACID 600 MG PO CAPS: 600.0000 mg | ORAL_CAPSULE | Freq: Every day | ORAL | 0 refills | Status: DC

## 2019-04-15 NOTE — Patient Instructions (Signed)
- Labs today -May consider daily alpha lipoic acid which is an antioxidant that may reduce free radical oxidative stress associated with diabetic polyneuropathy, existing evidence suggests that alpha lipoic acid significantly reduces stabbing, lancinating and burning pain and diabetic neuropathy with its onset of action as early as 1-2 weeks.   Peripheral Neuropathy Peripheral neuropathy is a type of nerve damage. It affects nerves that carry signals between the spinal cord and the arms, legs, and the rest of the body (peripheral nerves). It does not affect nerves in the spinal cord or brain. In peripheral neuropathy, one nerve or a group of nerves may be damaged. Peripheral neuropathy is a broad category that includes many specific nerve disorders, like diabetic neuropathy, hereditary neuropathy, and carpal tunnel syndrome. What are the causes? This condition may be caused by:  Diabetes. This is the most common cause of peripheral neuropathy.  Nerve injury.  Pressure or stress on a nerve that lasts a long time.  Lack (deficiency) of B vitamins. This can result from alcoholism, poor diet, or a restricted diet.  Infections.  Autoimmune diseases, such as rheumatoid arthritis and systemic lupus erythematosus.  Nerve diseases that are passed from parent to child (inherited).  Some medicines, such as cancer medicines (chemotherapy).  Poisonous (toxic) substances, such as lead and mercury.  Too little blood flowing to the legs.  Kidney disease.  Thyroid disease. In some cases, the cause of this condition is not known. What are the signs or symptoms? Symptoms of this condition depend on which of your nerves is damaged. Common symptoms include:  Loss of feeling (numbness) in the feet, hands, or both.  Tingling in the feet, hands, or both.  Burning pain.  Very sensitive skin.  Weakness.  Not being able to move a part of the body (paralysis).  Muscle twitching.  Clumsiness or  poor coordination.  Loss of balance.  Not being able to control your bladder.  Feeling dizzy.  Sexual problems. How is this diagnosed? Diagnosing and finding the cause of peripheral neuropathy can be difficult. Your health care provider will take your medical history and do a physical exam. A neurological exam will also be done. This involves checking things that are affected by your brain, spinal cord, and nerves (nervous system). For example, your health care provider will check your reflexes, how you move, and what you can feel. You may have other tests, such as:  Blood tests.  Electromyogram (EMG) and nerve conduction tests. These tests check nerve function and how well the nerves are controlling the muscles.  Imaging tests, such as CT scans or MRI to rule out other causes of your symptoms.  Removing a small piece of nerve to be examined in a lab (nerve biopsy). This is rare.  Removing and examining a small amount of the fluid that surrounds the brain and spinal cord (lumbar puncture). This is rare. How is this treated? Treatment for this condition may involve:  Treating the underlying cause of the neuropathy, such as diabetes, kidney disease, or vitamin deficiencies.  Stopping medicines that can cause neuropathy, such as chemotherapy.  Medicine to relieve pain. Medicines may include: ? Prescription or over-the-counter pain medicine. ? Antiseizure medicine. ? Antidepressants. ? Pain-relieving patches that are applied to painful areas of skin.  Surgery to relieve pressure on a nerve or to destroy a nerve that is causing pain.  Physical therapy to help improve movement and balance.  Devices to help you move around (assistive devices). Follow these instructions at  home: Medicines  Take over-the-counter and prescription medicines only as told by your health care provider. Do not take any other medicines without first asking your health care provider.  Do not drive or use  heavy machinery while taking prescription pain medicine. Lifestyle   Do not use any products that contain nicotine or tobacco, such as cigarettes and e-cigarettes. Smoking keeps blood from reaching damaged nerves. If you need help quitting, ask your health care provider.  Avoid or limit alcohol. Too much alcohol can cause a vitamin B deficiency, and vitamin B is needed for healthy nerves.  Eat a healthy diet. This includes: ? Eating foods that are high in fiber, such as fresh fruits and vegetables, whole grains, and beans. ? Limiting foods that are high in fat and processed sugars, such as fried or sweet foods. General instructions   If you have diabetes, work closely with your health care provider to keep your blood sugar under control.  If you have numbness in your feet: ? Check every day for signs of injury or infection. Watch for redness, warmth, and swelling. ? Wear padded socks and comfortable shoes. These help protect your feet.  Develop a good support system. Living with peripheral neuropathy can be stressful. Consider talking with a mental health specialist or joining a support group.  Use assistive devices and attend physical therapy as told by your health care provider. This may include using a walker or a cane.  Keep all follow-up visits as told by your health care provider. This is important. Contact a health care provider if:  You have new signs or symptoms of peripheral neuropathy.  You are struggling emotionally from dealing with peripheral neuropathy.  Your pain is not well-controlled. Get help right away if:  You have an injury or infection that is not healing normally.  You develop new weakness in an arm or leg.  You fall frequently. Summary  Peripheral neuropathy is when the nerves in the arms, or legs are damaged, resulting in numbness, weakness, or pain.  There are many causes of peripheral neuropathy, including diabetes, pinched nerves, vitamin  deficiencies, autoimmune disease, and hereditary conditions.  Diagnosing and finding the cause of peripheral neuropathy can be difficult. Your health care provider will take your medical history, do a physical exam, and do tests, including blood tests and nerve function tests.  Treatment involves treating the underlying cause of the neuropathy and taking medicines to help control pain. Physical therapy and assistive devices may also help. This information is not intended to replace advice given to you by your health care provider. Make sure you discuss any questions you have with your health care provider. Document Released: 07/14/2002 Document Revised: 07/06/2017 Document Reviewed: 10/02/2016 Elsevier Patient Education  2020 [employer].

## 2019-04-15 NOTE — Progress Notes (Signed)
[facility]    Provider:  Dr Eduardo Watts Requesting Provider: [name], MD Primary Care Provider:  [name], MD  CC:  Peripheral Neuropathy  HPI:  Eduardo Watts. is a 73 y.o. male here as requested by Eduardo Cruel, MD for peripheral neuropathy.  Past medical history vitamin D deficiency, sleep-related hypoxia, low testosterone, elevated ferritin, hypertension, anxiety, hyperlipidemia, sciatica, balance problem, peripheral neuropathy, diabetes mellitus with diabetic neuropathy. Symptoms started worsening 6-8 months ago and worsening. Mostly in his right foot and in his left foot as well. Mostly around the big toe especially the right > left foot. No pain, he is sleeping well, there are some balance issues. Not up to the ankle. Mostly numbness. He also has low back pain for 40 years which has improved over the years and he does not think it is associated. He has been diabetic for a few years. He can have sciatica down his left leg and can be really painful. The low back pain is infrequent, every other month and tylenol helps. He drink 2-3 lasses of wine a day for at least 6-7 years. He feels he has balance problems. When he gets up at night he wavers back in forth, in the dark and boughts in the shower when he closes his eyes and he has to hold the wall. He has a longer history of alcohol use for many years off and on. In his thirties he drank bourbon and then took up the wine bottle. No other focal neurologic deficits, associated symptoms, inciting events or modifiable factors.  Reviewed notes, labs and imaging from outside physicians, which showed:    CT head 11/2017 showed No acute intracranial abnormalities including mass lesion or mass effect, hydrocephalus, extra-axial fluid collection, midline shift, hemorrhage, or acute infarction, large ischemic events (personally reviewed images)     I reviewed Dr. Alan Watts notes.  Patient has neuropathy,  taking gabapentin without relief, gabapentin helps pain in the feet at night but they still hurt during the day very frustrating, wears oxygen at night per cardiology her lung doctor, he has diabetes with diabetic neuropathy, peripheral neuropathy, mixed hyperlipidemia, he also feels he has balance issues, dizziness may be due to ear impaction, consider balance training, he also has unspecified vitamin D deficiency, starting Debrox for cerumen impaction, started vitamin B complex for peripheral neuropathy, hemoglobin A1c was 6.7, ferritin 472, glucose 178.  Patient feels his balance is slightly better by stopping alcohol, and he started taking a vitamin complex B.  A1c has been as high as over 7.  He has numbness in his feet.-Diet was discussed..  They discussed diet and exercise extensively.  I also reviewed recent labs which included normal thyroid, MCV was 95.4, glucose was 178, ferritin was 472, B12 was greater than 1525, hemoglobin A1c was 6.7.  Review of Systems: Patient complains of symptoms per HPI as well as the following symptoms: numbness, cramps, easy bruising, cramps, SOB, impotence, fatigue, SOB. Pertinent negatives and positives per HPI. All others negative.   Social History   Socioeconomic History   Marital status: Married    Spouse name: Not on file   Number of children: 1   Years of education: 17   Highest education level: Not on file  Occupational History   Not on file  Social Needs   Financial resource strain: Not on file   Food insecurity    Worry: Not on file    Inability: Not on file  Transportation needs    Medical: Not on file    Non-medical: Not on file  Tobacco Use   Smoking status: Never Smoker   Smokeless tobacco: Never Used  Substance and Sexual Activity   Alcohol use: Yes    Alcohol/week: 14.0 - 21.0 standard drinks    Types: 14 - 21 Glasses of wine per week   Drug use: No   Sexual activity: Not on file  Lifestyle   Physical activity     Days per week: Not on file    Minutes per session: Not on file   Stress: Not on file  Relationships   Social connections    Talks on phone: Not on file    Gets together: Not on file    Attends religious service: Not on file    Active member of club or organization: Not on file    Attends meetings of clubs or organizations: Not on file    Relationship status: Not on file   Intimate partner violence    Fear of current or ex partner: Not on file    Emotionally abused: Not on file    Physically abused: Not on file    Forced sexual activity: Not on file  Other Topics Concern   Not on file  Social History Narrative   Never smoked   Alcohol yes -2-3 glasses of wine/night   Caffeine- yes 1 cup of coffee/day   Recreational drug use -No   Diet- fruits and vegetables,less fried food more pasta   Exercise- decrease to fatigue ,does yard work,and  walks occasionally    Occupation -employed Press photographer   Married 1 daughter/ 1 granddaughter   Goes by [name]" loves animals    Family History  Problem Relation Age of Onset   Dementia Mother    Stroke Mother    Cancer - Prostate Father    Melanoma Father    Hyperlipidemia Sister        high cholestrol   Depression Brother    Suicidality Brother    Lung disease Maternal Grandfather    Heart attack Neg Hx    Hypertension Neg Hx     Past Medical History:  Diagnosis Date   Anxiety    Asthma    as a child   Depression    sees Dr [name]   History of BPH    Dr.Dahlstedt   Hyperlipidemia    Prediabetes    Sleep disturbances     Patient Active Problem List   Diagnosis Date Noted   Peripheral polyneuropathy 04/15/2019   GERD (gastroesophageal reflux disease) 10/08/2018   Prediabetes 11/08/2016   Alcohol use 07/13/2015   Dyspnea on exertion 04/02/2015   Insomnia 04/02/2015   Palpitations 01/27/2015   Nocturnal hypoxia 01/27/2015   Right-sided chest wall pain 11/26/2014   Hypotension 09/08/2014    Cough 06/09/2014   Coronary atherosclerosis of native coronary artery 05/06/2014   Nonspecific abnormal unspecified cardiovascular function study 04/21/2014   Mixed hyperlipidemia 07/28/2013   Nonspecific abnormal electrocardiogram (ECG) (EKG) 07/28/2013    Past Surgical History:  Procedure Laterality Date   LEFT HEART CATHETERIZATION WITH CORONARY ANGIOGRAM N/A 04/30/2014   Procedure: LEFT HEART CATHETERIZATION WITH CORONARY ANGIOGRAM;  Surgeon: [name], MD;  Location: [facility] CATH LAB;  Service: Cardiovascular;  Laterality: N/A;   NASAL SEPTUM SURGERY  1975   R cheek benign tumor      1992, again 1998.   TONSILLECTOMY      Current Outpatient Medications  Medication  Sig Dispense Refill   alfuzosin (UROXATRAL) 10 MG 24 hr tablet Take 10 mg by mouth daily.     B Complex Vitamins (B COMPLEX PO) Take 1 tablet by mouth daily.      CALCIUM PO Take 1 tablet by mouth daily.      carvedilol (COREG) 3.125 MG tablet Take 3.125 mg by mouth daily. Takes 1/2 tablet by mouth daily     Cholecalciferol (VITAMIN D3) 5000 units CAPS Take 1 capsule by mouth daily.     cyclobenzaprine (FLEXERIL) 10 MG tablet Take 1 tablet (10 mg total) by mouth at bedtime. 30 tablet 11   ezetimibe (ZETIA) 10 MG tablet Take 1 tablet (10 mg total) by mouth daily. 30 tablet 11   finasteride (PROSCAR) 5 MG tablet Take 1 tablet by mouth every other night     fluticasone (FLONASE) 50 MCG/ACT nasal spray Place 1 spray into both nostrils daily.      gabapentin (NEURONTIN) 300 MG capsule Take 300 mg by mouth daily.     hydrOXYzine (ATARAX/VISTARIL) 10 MG tablet Take 1 tablet (10 mg total) by mouth 3 (three) times daily as needed for anxiety. 90 tablet 5   LORazepam (ATIVAN) 0.5 MG tablet Take 2 tablets (1 mg total) by mouth at bedtime. 60 tablet 5   naltrexone (DEPADE) 50 MG tablet Take 1 tablet by mouth in the evening 30 tablet 0   Omega-3 Fatty Acids (FISH OIL) 1000 MG CAPS Take 1 capsule by mouth  daily.     rosuvastatin (CRESTOR) 5 MG tablet Take 5 mg by mouth every other day.     zinc sulfate (ZINC-220) 220 (50 Zn) MG capsule Take 220 mg by mouth daily.     Alpha-Lipoic Acid 600 MG CAPS Take 1 capsule (600 mg total) by mouth daily. 30 capsule 0   No current facility-administered medications for this visit.     Allergies as of 04/15/2019 - Review Complete 04/15/2019  Allergen Reaction Noted   Zocor [simvastatin] Other (See Comments) 07/26/2013   Trazodone and nefazodone Other (See Comments) 07/26/2013    Vitals: BP 129/80 (BP Location: Right Arm, Patient Position: Sitting)    Pulse 96    Temp 97.8 F (36.6 C) Comment: taken by check-in staff   Ht _0  (1.88 m)    Wt 212 lb (96.2 kg)    BMI 27.22 kg/m  Last Weight:  Wt Readings from Last 1 Encounters:  04/15/19 212 lb (96.2 kg)   Last Height:   Ht Readings from Last 1 Encounters:  04/15/19 _1  (1.88 m)     Physical exam: Exam: Gen: NAD, conversant, well nourised, obese, well groomed                     CV: RRR, no MRG. No Carotid Bruits. No peripheral edema, warm, nontender Eyes: Conjunctivae clear without exudates or hemorrhage  Neuro: Detailed Neurologic Exam  Speech:    Speech is normal; fluent and spontaneous with normal comprehension.  Cognition:    The patient is oriented to person, place, and time;     recent and remote memory intact;     language fluent;     normal attention, concentration,     fund of knowledge Cranial Nerves:    The pupils are equal, round, and reactive to light. Attempted fundoscopy could not visualize. Visual fields are full to finger confrontation. Extraocular movements are intact. Trigeminal sensation is intact and the muscles of mastication are normal. The face  is symmetric. The palate elevates in the midline. Hearing intact. Voice is normal. Shoulder shrug is normal. The tongue has normal motion without fasciculations.   Coordination:    No dysmetria  Gait:     Heel-toe and tandem gait with some imbalance  Motor Observation:    No asymmetry, no atrophy, and no involuntary movements noted. Tone:    Normal muscle tone.    Posture:    Posture is normal. normal erect    Strength:    Strength is V/V in the upper and lower limbs.      Sensation: intact to LT, distal decrease pin prick in great toes, 5 beats vibration and intact proprioception at the great toes     Reflex Exam:  DTR's:    Absent AJs. Deep tendon reflexes in the upper and lower extremities are symmetrical bilaterally.   Toes:    The toes are equiv bilaterally.   Clonus:    Clonus is absent.    Assessment/Plan:  73 year old with distal mild sensory peripheral neuropathy likely multifactorial including long-term alcohol use, diabetes, possibly contribution from lumbosacral radiculopathy. Will check for other causes. No pain, no need for treatment. Discussed fall risks and balance exercises, yoga.    Orders Placed This Encounter  Procedures   Sjogren's syndrome antibods(ssa + ssb)   ANA w/Reflex   Heavy metals, blood   Vitamin B6   Multiple Myeloma Panel (SPEP&IFE w/QIG)   B. burgdorfi Antibody   Vitamin B1   Meds ordered this encounter  Medications   Alpha-Lipoic Acid 600 MG CAPS    Sig: Take 1 capsule (600 mg total) by mouth daily.    Dispense:  30 capsule    Refill:  0    Cc: Eduardo Cruel, MD,    Sarina Ill, MD  [address] [facility] [address] [place], [place] [address]  Phone [phone] Fax [phone]

## 2019-04-23 ENCOUNTER — Telehealth: Payer: Self-pay | Admitting: *Deleted

## 2019-04-23 NOTE — Telephone Encounter (Signed)
-----   Message from Melvenia Beam, MD sent at 04/23/2019  3:34 PM EDT ----- Labs unremarkable, thanks

## 2019-04-23 NOTE — Telephone Encounter (Signed)
Called pt & LVM (ok per DPR) advising labs unremarkable, no concerns noted by Dr. Jaynee Eagles. I asked for a call back if he has any questions. Left office number in message.

## 2019-04-24 DIAGNOSIS — E1140 Type 2 diabetes mellitus with diabetic neuropathy, unspecified: Secondary | ICD-10-CM | POA: Diagnosis not present

## 2019-04-24 DIAGNOSIS — E782 Mixed hyperlipidemia: Secondary | ICD-10-CM | POA: Diagnosis not present

## 2019-04-24 LAB — ANA W/REFLEX: Anti Nuclear Antibody (ANA): NEGATIVE

## 2019-04-24 LAB — MULTIPLE MYELOMA PANEL, SERUM
Albumin SerPl Elph-Mcnc: 3.9 g/dL (ref 2.9–4.4)
Albumin/Glob SerPl: 1.3 (ref 0.7–1.7)
Alpha 1: 0.2 g/dL (ref 0.0–0.4)
Alpha2 Glob SerPl Elph-Mcnc: 0.9 g/dL (ref 0.4–1.0)
B-Globulin SerPl Elph-Mcnc: 1.1 g/dL (ref 0.7–1.3)
Gamma Glob SerPl Elph-Mcnc: 0.9 g/dL (ref 0.4–1.8)
Globulin, Total: 3.1 g/dL (ref 2.2–3.9)
IgA/Immunoglobulin A, Serum: 548 mg/dL — ABNORMAL HIGH (ref 61–437)
IgG (Immunoglobin G), Serum: 794 mg/dL (ref 603–1613)
IgM (Immunoglobulin M), Srm: 78 mg/dL (ref 15–143)
Total Protein: 7 g/dL (ref 6.0–8.5)

## 2019-04-24 LAB — HEAVY METALS, BLOOD
Arsenic: 5 ug/L (ref 2–23)
Lead, Blood: 2 ug/dL (ref 0–4)
Mercury: <1 ug/L (ref 0.0–14.9)

## 2019-04-24 LAB — SJOGREN'S SYNDROME ANTIBODS(SSA + SSB)
ENA SSA (RO) Ab: <0.2 AI (ref 0.0–0.9)
ENA SSB (LA) Ab: <0.2 AI (ref 0.0–0.9)

## 2019-04-24 LAB — VITAMIN B6: Vitamin B6: 11.3 ug/L (ref 5.3–46.7)

## 2019-04-24 LAB — B. BURGDORFI ANTIBODIES: Lyme IgG/IgM Ab: <0.91 {ISR} (ref 0.00–0.90)

## 2019-04-24 LAB — VITAMIN B1: Thiamine: 242.3 nmol/L — ABNORMAL HIGH (ref 66.5–200.0)

## 2019-04-28 DIAGNOSIS — Z23 Encounter for immunization: Secondary | ICD-10-CM | POA: Diagnosis not present

## 2019-04-28 DIAGNOSIS — E1140 Type 2 diabetes mellitus with diabetic neuropathy, unspecified: Secondary | ICD-10-CM | POA: Diagnosis not present

## 2019-04-28 DIAGNOSIS — E782 Mixed hyperlipidemia: Secondary | ICD-10-CM | POA: Diagnosis not present

## 2019-05-13 DIAGNOSIS — L853 Xerosis cutis: Secondary | ICD-10-CM | POA: Diagnosis not present

## 2019-05-13 DIAGNOSIS — L820 Inflamed seborrheic keratosis: Secondary | ICD-10-CM | POA: Diagnosis not present

## 2019-05-13 DIAGNOSIS — L812 Freckles: Secondary | ICD-10-CM | POA: Diagnosis not present

## 2019-05-13 DIAGNOSIS — D1801 Hemangioma of skin and subcutaneous tissue: Secondary | ICD-10-CM | POA: Diagnosis not present

## 2019-05-13 DIAGNOSIS — D692 Other nonthrombocytopenic purpura: Secondary | ICD-10-CM | POA: Diagnosis not present

## 2019-05-13 DIAGNOSIS — L821 Other seborrheic keratosis: Secondary | ICD-10-CM | POA: Diagnosis not present

## 2019-05-13 DIAGNOSIS — D2271 Melanocytic nevi of right lower limb, including hip: Secondary | ICD-10-CM | POA: Diagnosis not present

## 2019-06-12 NOTE — Progress Notes (Signed)
Cardiology Office Note   Date:  06/12/2019   ID:  Eduardo Hoeg., DOB [birth year], MRN [id number]  PCP:  Eduardo Cruel, MD    No chief complaint on file.  CAD  Wt Readings from Last 3 Encounters:  04/15/19 212 lb (96.2 kg)  11/22/18 211 lb (95.7 kg)  10/08/18 209 lb 6.4 oz (95 kg)       History of Present Illness: Eduardo Juhasz. is a 73 y.o. male  who had a heart cath in 9/15. This showed moderate RCA disease. There was no significant obstructive disease. His ejection fraction was low normal. He had a pulmonary eval for cough. This is improved with less Coreg.   Hewas concernedin the pastabout AFib and stroke risk. He feltthat he haddaily palpitation. He hit his chest to help the palpitations resolve. Prior w/u for AFib has been negative.  He had given up alcohol since the day of the cath in 2015 but restarted alcohol in 2017.   He has an iron overload syndrome as well.   Hestartednighttime oxygen in 2017- but only uses it half the night, 2L/min. He had another sleep study in 2016 and it was normal. No need for it during the day. 94% during the day. 88% at night. Prescribed by Dr. Annamaria Watts.  He has decreased his alcohol intake to 2 glasses a day, down from a bottle a day.He has maintained this level. He is working with Dr. Clovis Watts to decrease his cravings for alcohol. Acampro was started for this.  The patient does not have symptoms concerning for COVID-19 infection (fever, chills, cough, or new shortness of breath).    He saw Dr. Harrington Watts. Diagnosed with peripheral neuropathy.  Started on Gabapentin.   Since the last visit, still bothered with foot numbness.  Denies : Chest pain. Dizziness. Leg edema. Nitroglycerin use. Orthopnea. Palpitations. Paroxysmal nocturnal dyspnea. Shortness of breath. Syncope.   Walks 3x/week, about 30 minutes at a time.     Past Medical History:  Diagnosis Date  . Anxiety   . Asthma    as a  child  . Depression    sees Dr Eduardo Watts  . History of BPH    Dr.Dahlstedt  . Hyperlipidemia   . Prediabetes   . Sleep disturbances     Past Surgical History:  Procedure Laterality Date  . LEFT HEART CATHETERIZATION WITH CORONARY ANGIOGRAM N/A 04/30/2014   Procedure: LEFT HEART CATHETERIZATION WITH CORONARY ANGIOGRAM;  Surgeon: Eduardo Booze, MD;  Location: [facility] CATH LAB;  Service: Cardiovascular;  Laterality: N/A;  . NASAL SEPTUM SURGERY  1975  . R cheek benign tumor      1992, again 1998.  . TONSILLECTOMY       Current Outpatient Medications  Medication Sig Dispense Refill  . alfuzosin (UROXATRAL) 10 MG 24 hr tablet Take 10 mg by mouth daily.    . Alpha-Lipoic Acid 600 MG CAPS Take 1 capsule (600 mg total) by mouth daily. 30 capsule 0  . B Complex Vitamins (B COMPLEX PO) Take 1 tablet by mouth daily.     Marland Kitchen CALCIUM PO Take 1 tablet by mouth daily.     . carvedilol (COREG) 3.125 MG tablet Take 3.125 mg by mouth daily. Takes 1/2 tablet by mouth daily    . Cholecalciferol (VITAMIN D3) 5000 units CAPS Take 1 capsule by mouth daily.    . cyclobenzaprine (FLEXERIL) 10 MG tablet Take 1 tablet (10 mg total) by mouth at bedtime. 30 tablet  11  . ezetimibe (ZETIA) 10 MG tablet Take 1 tablet (10 mg total) by mouth daily. 30 tablet 11  . finasteride (PROSCAR) 5 MG tablet Take 1 tablet by mouth every other night    . fluticasone (FLONASE) 50 MCG/ACT nasal spray Place 1 spray into both nostrils daily.     Marland Kitchen gabapentin (NEURONTIN) 300 MG capsule Take 300 mg by mouth daily.    . hydrOXYzine (ATARAX/VISTARIL) 10 MG tablet Take 1 tablet (10 mg total) by mouth 3 (three) times daily as needed for anxiety. 90 tablet 5  . LORazepam (ATIVAN) 0.5 MG tablet Take 2 tablets (1 mg total) by mouth at bedtime. 60 tablet 5  . naltrexone (DEPADE) 50 MG tablet Take 1 tablet by mouth in the evening 30 tablet 0  . Omega-3 Fatty Acids (FISH OIL) 1000 MG CAPS Take 1 capsule by mouth daily.    . rosuvastatin  (CRESTOR) 5 MG tablet Take 5 mg by mouth every other day.    . zinc sulfate (ZINC-220) 220 (50 Zn) MG capsule Take 220 mg by mouth daily.     No current facility-administered medications for this visit.     Allergies:   Zocor [simvastatin] and Trazodone and nefazodone    Social History:  The patient  reports that he has never smoked. He has never used smokeless tobacco. He reports current alcohol use of about 14.0 - 21.0 standard drinks of alcohol per week. He reports that he does not use drugs.   Family History:  The patient's family history includes Cancer - Prostate in his father; Dementia in his mother; Depression in his brother; Hyperlipidemia in his sister; Lung disease in his maternal grandfather; Melanoma in his father; Stroke in his mother; Suicidality in his brother.    ROS:  Please see the history of present illness.   Otherwise, review of systems are positive for foot numbness.   All other systems are reviewed and negative.    PHYSICAL EXAM: VS:  There were no vitals taken for this visit. , BMI There is no height or weight on file to calculate BMI. GEN: Well nourished, well developed, in no acute distress  HEENT: normal  Neck: no JVD, carotid bruits, or masses Cardiac: RRR; no murmurs, rubs, or gallops,no edema  Respiratory:  clear to auscultation bilaterally, normal work of breathing GI: soft, nontender, nondistended, + BS MS: no deformity or atrophy ; 3+ left DP, 2+ right Dp pulses Skin: warm and dry, no rash Neuro:  Strength and sensation are intact Psych: euthymic mood, full affect   EKG:   The ekg ordered today demonstrates    Recent Labs: No results found for requested labs within last 8760 hours.   Lipid Panel    Component Value Date/Time   CHOL 224 (H) 05/24/2017 0845   TRIG 141 05/24/2017 0845   HDL 57 05/24/2017 0845   CHOLHDL 3.9 05/24/2017 0845   LDLCALC 139 (H) 05/24/2017 0845     Other studies Reviewed: Additional studies/ records that were  reviewed today with results demonstrating: labs reviewed.   ASSESSMENT AND PLAN:  1. CAD: No angina.  Continue aggressive secondary prevention.    2. Pre-DM: 6.7 A1C in 04/2019.  COntinue healthy diet.  3. Alcohol abuse: Still drinking 2-3 drinks/day.  Cutting beack would be beneficial and may be contributing to neuropathy.  4. Hyperlipidemia: Cut back on red meat.  Try to eat more plant based.  5. Abnormal ECG: no change in 11/2018. 6. Neuropathy: Continue Gabapentin.  Can try B12 supplement.     Current medicines are reviewed at length with the patient today.  The patient concerns regarding his medicines were addressed.  The following changes have been made:  No change  Labs/ tests ordered today include:  No orders of the defined types were placed in this encounter.   Recommend 150 minutes/week of aerobic exercise Low fat, low carb, high fiber diet recommended  Disposition:   FU in 6 months   Signed, Larae Grooms, MD  06/12/2019 4:49 PM    [facility] [place], [place], [place]  [address] Phone: [phone]; Fax: [phone]

## 2019-06-16 ENCOUNTER — Ambulatory Visit (INDEPENDENT_AMBULATORY_CARE_PROVIDER_SITE_OTHER): Payer: Medicare Other | Admitting: Interventional Cardiology

## 2019-06-16 ENCOUNTER — Encounter: Payer: Self-pay | Admitting: Interventional Cardiology

## 2019-06-16 ENCOUNTER — Encounter (INDEPENDENT_AMBULATORY_CARE_PROVIDER_SITE_OTHER): Payer: Self-pay

## 2019-06-16 ENCOUNTER — Other Ambulatory Visit: Payer: Self-pay

## 2019-06-16 VITALS — BP 114/62 | HR 93 | Ht 74.0 in | Wt 211.0 lb

## 2019-06-16 DIAGNOSIS — I25119 Atherosclerotic heart disease of native coronary artery with unspecified angina pectoris: Secondary | ICD-10-CM | POA: Diagnosis not present

## 2019-06-16 DIAGNOSIS — R7303 Prediabetes: Secondary | ICD-10-CM

## 2019-06-16 DIAGNOSIS — Z789 Other specified health status: Secondary | ICD-10-CM

## 2019-06-16 DIAGNOSIS — E782 Mixed hyperlipidemia: Secondary | ICD-10-CM | POA: Diagnosis not present

## 2019-06-16 DIAGNOSIS — R9431 Abnormal electrocardiogram [ECG] [EKG]: Secondary | ICD-10-CM

## 2019-06-16 DIAGNOSIS — Z7289 Other problems related to lifestyle: Secondary | ICD-10-CM | POA: Diagnosis not present

## 2019-06-16 NOTE — Patient Instructions (Signed)
Medication Instructions:  Your physician recommends that you continue on your current medications as directed. Please refer to the Current Medication list given to you today.  *If you need a refill on your cardiac medications before your next appointment, please call your pharmacy*  Lab Work: None ordered  If you have labs (blood work) drawn today and your tests are completely normal, you will receive your results only by: . MyChart Message (if you have MyChart) OR . A paper copy in the mail If you have any lab test that is abnormal or we need to change your treatment, we will call you to review the results.  Testing/Procedures: None ordered  Follow-Up: At [facility], you and your health needs are our priority.  As part of our continuing mission to provide you with exceptional heart care, we have created designated Provider Care Teams.  These Care Teams include your primary Cardiologist (physician) and Advanced Practice Providers (APPs -  Physician Assistants and Nurse Practitioners) who all work together to provide you with the care you need, when you need it.  Your next appointment:   6 month(s)  The format for your next appointment:   Either In Person or Virtual  Provider:   You may see Jayadeep Varanasi, MD or one of the following Advanced Practice Providers on your designated Care Team:    Dayna Dunn, PA-C  Michele Lenze, PA-C   Other Instructions   

## 2019-06-25 DIAGNOSIS — E1140 Type 2 diabetes mellitus with diabetic neuropathy, unspecified: Secondary | ICD-10-CM | POA: Diagnosis not present

## 2019-06-25 DIAGNOSIS — N400 Enlarged prostate without lower urinary tract symptoms: Secondary | ICD-10-CM | POA: Diagnosis not present

## 2019-06-25 DIAGNOSIS — E782 Mixed hyperlipidemia: Secondary | ICD-10-CM | POA: Diagnosis not present

## 2019-06-25 DIAGNOSIS — I10 Essential (primary) hypertension: Secondary | ICD-10-CM | POA: Diagnosis not present

## 2019-07-11 DIAGNOSIS — R3912 Poor urinary stream: Secondary | ICD-10-CM | POA: Diagnosis not present

## 2019-07-11 DIAGNOSIS — N486 Induration penis plastica: Secondary | ICD-10-CM | POA: Diagnosis not present

## 2019-07-11 DIAGNOSIS — N5201 Erectile dysfunction due to arterial insufficiency: Secondary | ICD-10-CM | POA: Diagnosis not present

## 2019-07-11 DIAGNOSIS — N401 Enlarged prostate with lower urinary tract symptoms: Secondary | ICD-10-CM | POA: Diagnosis not present

## 2019-07-28 ENCOUNTER — Other Ambulatory Visit: Payer: Self-pay

## 2019-07-28 ENCOUNTER — Encounter: Payer: Self-pay | Admitting: Psychiatry

## 2019-07-28 ENCOUNTER — Ambulatory Visit (INDEPENDENT_AMBULATORY_CARE_PROVIDER_SITE_OTHER): Payer: Medicare Other | Admitting: Psychiatry

## 2019-07-28 DIAGNOSIS — I25119 Atherosclerotic heart disease of native coronary artery with unspecified angina pectoris: Secondary | ICD-10-CM

## 2019-07-28 DIAGNOSIS — F411 Generalized anxiety disorder: Secondary | ICD-10-CM

## 2019-07-28 DIAGNOSIS — F5105 Insomnia due to other mental disorder: Secondary | ICD-10-CM | POA: Diagnosis not present

## 2019-07-28 DIAGNOSIS — M545 Low back pain, unspecified: Secondary | ICD-10-CM

## 2019-07-28 DIAGNOSIS — F99 Mental disorder, not otherwise specified: Secondary | ICD-10-CM

## 2019-07-28 MED ORDER — LORAZEPAM 0.5 MG PO TABS: 1.0000 mg | ORAL_TABLET | Freq: Every day | ORAL | 5 refills | Status: DC

## 2019-07-28 MED ORDER — CYCLOBENZAPRINE HCL 10 MG PO TABS: 10.0000 mg | ORAL_TABLET | Freq: Every day | ORAL | 11 refills | Status: DC

## 2019-07-28 NOTE — Progress Notes (Signed)
Eduardo Watts [id number] [birth year] 73 y.o.   Subjective:   Patient ID:  Eduardo Watts. is a 73 y.o. (DOB 09/11/1945) male.  Chief Complaint:  Chief Complaint  Patient presents with  . Follow-up    Medication Management  . Anxiety    Medication Management  . Sleeping Problem    Anxiety Patient reports no confusion, decreased concentration, nervous/anxious behavior or suicidal ideas.    Eduardo Watts. presents to the office today for follow-up of sleep, anxiety and alcohol abuse.  Last seen August 20 , 2020.  No meds changed.  He was not significantly depressed or anxious but was needing medication for insomnia. Meds: Flexeril 10 HS, Gabapentin 300 HS for neuropathy helps a little, lorazepam 0.5mg  2 HS,   sleeps good with that.  GD taking piano lessons from Eduardo Watts and doing well.  In 4th grade. W active in politics.  Patient history of TV news and commercials.  Doing fine overall, but neuropathy in feet worries him.  DM 2 but controlled generally.  Working to reduce sugar.  Anxiety is manageable and minimal.  Moderate alcohol 2-3 glasses but denies excess. No complications except with spouse.   W afraid to go to grocery store. Overall he's doing well.  Occ down days.  Still haven't patched things up with sister completely.  Things cold with sister. Prayed to get into control and I think I'm there  RE alcohol and dealing with sister.   Some concern about depending on lorazepam for sleep.  Can't sleep without it.  Do SE.  On it since 2012.  Covid hurting business.  Patient reports stable mood and denies depressed or irritable moods usually.  Patient denies any recent difficulty with anxiety.  Patient denies difficulty with sleep initiation or maintenance. Ativan 1 mg and hydroxyzine help sleep.  Some mild EMA.  8 hours.   Denies appetite disturbance.  Patient reports that energy, appetite,  and motivation have been good.  Patient denies any difficulty with  concentration.  Patient denies any suicidal ideation.  Past Psychiatric Medication Trials:  Ambien felll out of bed, Ativan, Flexeril, Belsomra, hydrozyzine, Xanax hives, Prosom, trazodone NR, No Seroquel DT B 's suicide, cyclobenzaprine helps,  Klonopin SE, Librium detox,  Lithium, fluoxetine, topiramate, propranolol, Naltrexone   Review of Systems:  Review of Systems  Neurological: Positive for numbness. Negative for tremors and weakness.       Neuropathy in feet affecting  balance  Psychiatric/Behavioral: Negative for agitation, behavioral problems, confusion, decreased concentration, dysphoric mood, hallucinations, self-injury, sleep disturbance and suicidal ideas. The patient is not nervous/anxious and is not hyperactive.     Medications: I have reviewed the patient's current medications.  Current Outpatient Medications  Medication Sig Dispense Refill  . alfuzosin (UROXATRAL) 10 MG 24 hr tablet Take 10 mg by mouth daily.    . Alpha-Lipoic Acid 600 MG CAPS Take 1 capsule (600 mg total) by mouth daily. 30 capsule 0  . B Complex Vitamins (B COMPLEX PO) Take 1 tablet by mouth daily.     Marland Kitchen CALCIUM PO Take 1 tablet by mouth daily.     . carvedilol (COREG) 3.125 MG tablet     . Cholecalciferol (VITAMIN D3) 5000 units CAPS Take 1 capsule by mouth daily.    . cyclobenzaprine (FLEXERIL) 10 MG tablet Take 1 tablet (10 mg total) by mouth at bedtime. 30 tablet 11  . ezetimibe (ZETIA) 10 MG tablet Take 1 tablet (10 mg total) by  mouth daily. 30 tablet 11  . finasteride (PROSCAR) 5 MG tablet Take 1 tablet by mouth every other night    . fluticasone (FLONASE) 50 MCG/ACT nasal spray Place 1 spray into both nostrils daily.     Marland Kitchen gabapentin (NEURONTIN) 300 MG capsule Take 300 mg by mouth daily.    . hydrOXYzine (ATARAX/VISTARIL) 10 MG tablet Take 1 tablet (10 mg total) by mouth 3 (three) times daily as needed for anxiety. (Patient taking differently: Take 20 mg by mouth 3 (three) times daily as needed  for anxiety. ) 90 tablet 5  . Krill Oil 1000 MG CAPS Take by mouth.    Marland Kitchen LORazepam (ATIVAN) 0.5 MG tablet Take 2 tablets (1 mg total) by mouth at bedtime. 60 tablet 5  . Magnesium 100 MG CAPS Take by mouth.    . Omega-3 Fatty Acids (FISH OIL) 1000 MG CAPS Take 1 capsule by mouth daily.    . rosuvastatin (CRESTOR) 5 MG tablet Take 5 mg by mouth every other day.    . zinc sulfate (ZINC-220) 220 (50 Zn) MG capsule Take 220 mg by mouth daily.     No current facility-administered medications for this visit.    Medication Side Effects: None  Allergies:  Allergies  Allergen Reactions  . Zocor [Simvastatin] Other (See Comments)    Muscle aches  . Trazodone And Nefazodone Other (See Comments)    "crazy"    Past Medical History:  Diagnosis Date  . Anxiety   . Asthma    as a child  . Depression    sees Dr [name]  . History of BPH    Dr.Dahlstedt  . Hyperlipidemia   . Prediabetes   . Sleep disturbances     Family History  Problem Relation Age of Onset  . Dementia Mother   . Stroke Mother   . Cancer - Prostate Father   . Melanoma Father   . Hyperlipidemia Sister        high cholestrol  . Depression Brother   . Suicidality Brother   . Lung disease Maternal Grandfather   . Heart attack Neg Hx   . Hypertension Neg Hx     Social History   Socioeconomic History  . Marital status: Married    Spouse name: Not on file  . Number of children: 1  . Years of education: 71  . Highest education level: Not on file  Occupational History  . Not on file  Tobacco Use  . Smoking status: Never Smoker  . Smokeless tobacco: Never Used  Substance and Sexual Activity  . Alcohol use: Yes    Alcohol/week: 14.0 - 21.0 standard drinks    Types: 14 - 21 Glasses of wine per week  . Drug use: No  . Sexual activity: Not on file  Other Topics Concern  . Not on file  Social History Narrative   Never smoked   Alcohol yes -2-3 glasses of wine/night   Caffeine- yes 1 cup of coffee/day    Recreational drug use -No   Diet- fruits and vegetables,less fried food more pasta   Exercise- decrease to fatigue ,does yard work,and  walks occasionally    Occupation -employed Press photographer   Married 1 daughter/ 1 granddaughter   Goes by [employer]" loves animals   Social Determinants of Health   Financial Resource Strain:   . Difficulty of Paying Living Expenses: Not on file  Food Insecurity:   . Worried About Charity fundraiser in the Last Year: Not  on file  . Ran Out of Food in the Last Year: Not on file  Transportation Needs:   . Lack of Transportation (Medical): Not on file  . Lack of Transportation (Non-Medical): Not on file  Physical Activity:   . Days of Exercise per Week: Not on file  . Minutes of Exercise per Session: Not on file  Stress:   . Feeling of Stress : Not on file  Social Connections:   . Frequency of Communication with Friends and Family: Not on file  . Frequency of Social Gatherings with Friends and Family: Not on file  . Attends Religious Services: Not on file  . Active Member of Clubs or Organizations: Not on file  . Attends Archivist Meetings: Not on file  . Marital Status: Not on file  Intimate Partner Violence:   . Fear of Current or Ex-Partner: Not on file  . Emotionally Abused: Not on file  . Physically Abused: Not on file  . Sexually Abused: Not on file    Past Medical History, Surgical history, Social history, and Family history were reviewed and updated as appropriate.   Please see review of systems for further details on the patient's review from today.   Objective:   Physical Exam:  There were no vitals taken for this visit.  Physical Exam Constitutional:      General: He is not in acute distress.    Appearance: He is well-developed.  Musculoskeletal:        General: No deformity.  Neurological:     Mental Status: He is alert and oriented to person, place, and time.     Motor: No tremor.     Coordination: Coordination normal.      Gait: Gait normal.  Psychiatric:        Attention and Perception: Attention and perception normal.        Mood and Affect: Mood is not anxious or depressed. Affect is not labile, blunt, angry or inappropriate.        Speech: Speech normal.        Behavior: Behavior normal.        Thought Content: Thought content normal. Thought content does not include homicidal or suicidal ideation. Thought content does not include homicidal or suicidal plan.        Cognition and Memory: Cognition normal.        Judgment: Judgment normal.     Comments: Insight intact. No auditory or visual hallucinations. No delusions.      Lab Review:     Component Value Date/Time   NA 136 05/24/2017 0845   K 4.6 05/24/2017 0845   CL 97 05/24/2017 0845   CO2 18 (L) 05/24/2017 0845   GLUCOSE 148 (H) 05/24/2017 0845   GLUCOSE 140 (H) 04/21/2014 0917   BUN 11 05/24/2017 0845   CREATININE 0.86 05/24/2017 0845   CALCIUM 9.3 05/24/2017 0845   PROT 7.0 04/15/2019 0854   ALBUMIN 4.5 05/24/2017 0845   AST 81 (H) 05/24/2017 0845   ALT 71 (H) 05/24/2017 0845   ALKPHOS 75 05/24/2017 0845   BILITOT 1.1 05/24/2017 0845   GFRNONAA 86 05/24/2017 0845   GFRAA 99 05/24/2017 0845       Component Value Date/Time   WBC 7.6 04/21/2014 0917   RBC 4.60 04/21/2014 0917   HGB 15.5 04/21/2014 0917   HCT 46.0 04/21/2014 0917   PLT 235.0 04/21/2014 0917   MCV 100.0 04/21/2014 0917   MCH 31.8 07/02/2012 2005  MCHC 33.7 04/21/2014 0917   RDW 13.4 04/21/2014 0917   LYMPHSABS 1.1 04/21/2014 0917   MONOABS 0.5 04/21/2014 0917   EOSABS 0.2 04/21/2014 0917   BASOSABS 0.0 04/21/2014 0917    No results found for: POCLITH, LITHIUM   No results found for: PHENYTOIN, PHENOBARB, VALPROATE, CBMZ   .res Assessment: Plan:    Generalized anxiety disorder  Insomnia due to other mental disorder - Plan: LORazepam (ATIVAN) 0.5 MG tablet  Bilateral low back pain without sciatica, unspecified chronicity - Plan: cyclobenzaprine  (FLEXERIL) 10 MG tablet   Disc sleep med options including no meds.  Wife complains about him taking sleep meds.  Chronic insomnia otherwise.  Takes Flexeril every other day and doesn't take hydroxyzine on that night alternates them.   Answered questions about diagnosis and prognosis.  Disc this at length.    We discussed the short-term risks associated with benzodiazepines including sedation and increased fall risk among others.  Discussed long-term side effect risk including dependence, potential withdrawal symptoms, and the potential eventual dose-related risk of dementia.  Also discussed the consequences of poor sleep on brain and body health.  Also some risk from hydroxyzine also.  He's doing well without SE so no changes.    Sleep hygiene. Disc timing of sleep meds to help him stay asleep.  Disc option increase hydroxyzine and hold flexeril or vice versa.  Flexeril helps back pain.  Enc sobriety.  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  He is not markedly depressed nor markedly anxious at this time.  He does need the medications for insomnia as noted.  No med changes today  FU 4-6 mos  Lynder Parents, MD, DFAPA     Please see After Visit Summary for patient specific instructions.  Future Appointments  Date Time Provider [place]  10/08/2019  9:00 AM Baird Lyons D, MD [facility] None  10/13/2019  8:00 AM Lomax, Amy, NP [facility] None    No orders of the defined types were placed in this encounter.     -------------------------------

## 2019-08-25 DIAGNOSIS — G5791 Unspecified mononeuropathy of right lower limb: Secondary | ICD-10-CM | POA: Diagnosis not present

## 2019-08-25 DIAGNOSIS — M19071 Primary osteoarthritis, right ankle and foot: Secondary | ICD-10-CM | POA: Diagnosis not present

## 2019-08-25 DIAGNOSIS — G5792 Unspecified mononeuropathy of left lower limb: Secondary | ICD-10-CM | POA: Diagnosis not present

## 2019-08-25 DIAGNOSIS — M19072 Primary osteoarthritis, left ankle and foot: Secondary | ICD-10-CM | POA: Diagnosis not present

## 2019-09-02 DIAGNOSIS — R202 Paresthesia of skin: Secondary | ICD-10-CM | POA: Diagnosis not present

## 2019-09-02 DIAGNOSIS — G603 Idiopathic progressive neuropathy: Secondary | ICD-10-CM | POA: Diagnosis not present

## 2019-09-02 DIAGNOSIS — G609 Hereditary and idiopathic neuropathy, unspecified: Secondary | ICD-10-CM | POA: Diagnosis not present

## 2019-09-02 DIAGNOSIS — M5417 Radiculopathy, lumbosacral region: Secondary | ICD-10-CM | POA: Diagnosis not present

## 2019-09-02 DIAGNOSIS — G5601 Carpal tunnel syndrome, right upper limb: Secondary | ICD-10-CM | POA: Diagnosis not present

## 2019-09-02 DIAGNOSIS — E559 Vitamin D deficiency, unspecified: Secondary | ICD-10-CM | POA: Diagnosis not present

## 2019-09-02 DIAGNOSIS — R634 Abnormal weight loss: Secondary | ICD-10-CM | POA: Diagnosis not present

## 2019-09-02 DIAGNOSIS — G2581 Restless legs syndrome: Secondary | ICD-10-CM | POA: Diagnosis not present

## 2019-09-02 DIAGNOSIS — Z79899 Other long term (current) drug therapy: Secondary | ICD-10-CM | POA: Diagnosis not present

## 2019-09-30 DIAGNOSIS — M5417 Radiculopathy, lumbosacral region: Secondary | ICD-10-CM | POA: Diagnosis not present

## 2019-09-30 DIAGNOSIS — R202 Paresthesia of skin: Secondary | ICD-10-CM | POA: Diagnosis not present

## 2019-09-30 DIAGNOSIS — G5601 Carpal tunnel syndrome, right upper limb: Secondary | ICD-10-CM | POA: Diagnosis not present

## 2019-09-30 DIAGNOSIS — E119 Type 2 diabetes mellitus without complications: Secondary | ICD-10-CM | POA: Diagnosis not present

## 2019-10-08 ENCOUNTER — Ambulatory Visit (INDEPENDENT_AMBULATORY_CARE_PROVIDER_SITE_OTHER): Payer: Medicare Other

## 2019-10-08 ENCOUNTER — Encounter: Payer: Self-pay | Admitting: Internal Medicine

## 2019-10-08 ENCOUNTER — Other Ambulatory Visit: Payer: Self-pay

## 2019-10-08 ENCOUNTER — Ambulatory Visit (INDEPENDENT_AMBULATORY_CARE_PROVIDER_SITE_OTHER): Payer: Medicare Other | Admitting: Internal Medicine

## 2019-10-08 VITALS — BP 112/68 | HR 103 | Temp 97.6°F | Ht 74.0 in | Wt 213.0 lb

## 2019-10-08 DIAGNOSIS — I2510 Atherosclerotic heart disease of native coronary artery without angina pectoris: Secondary | ICD-10-CM | POA: Diagnosis not present

## 2019-10-08 DIAGNOSIS — G4734 Idiopathic sleep related nonobstructive alveolar hypoventilation: Secondary | ICD-10-CM

## 2019-10-08 DIAGNOSIS — R0902 Hypoxemia: Secondary | ICD-10-CM | POA: Diagnosis not present

## 2019-10-08 NOTE — Progress Notes (Signed)
HPI male never smoker followed for cough, mild obstructive airways disease, complicated by CAD, palpitations, old right rib fractures PFT 07/13/14- minimal obstructive airways disease, insignificant response to bronchodilator, normal lung volumes and diffusion. Echocardiogram 08/11/2013-normal LV function, normal valves, no evidence of prior MI Walk Test-04/02/2015-he walked 555 feet starting with saturation 93%, falling to a nadir of 92% on room air with no oxygen desaturation. Pulse rate 100-109, regular. ONOX- 04/25/15- qualified for sleep O2 with 50 minutes<= 88% room air .NPSG 09/29/15- WNL, AHI 3/ hr, with desat to 85%, mean sat 90.1% ----------------------------------------------------------------------  10/08/2018- 74 year old male never smoker followed for Chronic hypoxic respiratory / nocturnal hypoxemia, cough, mild obstructive airways disease, old right rib fractures complicated by CAD, palpitations, Insomnia/ Dr [name] O2 1 L/sleep/[facility] -----states breathing is doing "very well"; has "scratch" in throat from nighttime O2; no other issues Says he "loves" his oxygen at night. Over several months he has been waking around 2AM with annoying feeling "like ants" in his throat. Not specifically aware of associated reflux or pndrip. Has humidifier on concentrator, but never uses it. Now has peripheral neuropathy with numb feet. Prediabetic.   10/08/19-74 year old male never smoker followed for Chronic hypoxic respiratory / nocturnal hypoxemia, cough, mild obstructive airways disease, old right rib fractures complicated by CAD, palpitations, Insomnia/ Dr [name] O2 2 L/sleep/[facility]  -----f/u Nocturnal hypoxia.O2 2l/min at night. Breathing is at patient's baseline.  Concentrator working well. He often takes it off part way through the night. Denies routine cough or unexpected dyspnea with exertion. Denies cough, wheeze or acute respiratory symptoms. Continues cardiology f/u/ Dr Irish Lack. Works on  ETOH hx w Dr [name].  ROS-see HPI  + = positive Constitutional:   No-   weight loss, night sweats, fevers, chills, fatigue, lassitude. HEENT:   No-  headaches, difficulty swallowing, tooth/dental problems, sore throat,       No-  sneezing, itching, ear ache, nasal congestion, post nasal drip,  CV:  No-   chest pain, orthopnea, PND, swelling in lower extremities, anasarca,                              dizziness, +palpitations Resp: +shortness of breath with exertion or at rest.              No-   productive cough,  non-productive cough,  No- coughing up of blood.              No-   change in color of mucus.  No- wheezing.   Skin: No-   rash or lesions. GI:  No-   heartburn, indigestion, abdominal pain, nausea, vomiting,  GU: . MS:  No-   joint pain or swelling.   Neuro-     nothing unusual Psych:  No- change in mood or affect. + depression or anxiety.  No memory loss.  OBJ- Physical Exam    General- Alert, Oriented, Affect-appropriate, Distress- none acute Skin- rash-none, lesions- none, excoriation- none Lymphadenopathy- none Head- atraumatic            Eyes- Gross vision intact, PERRLA, conjunctivae and secretions clear            Ears- Hearing, canals-normal            Nose- Clear, no-Septal dev, mucus, polyps, erosion, perforation             Throat- Mallampati II/+ uvulopalatoplasty , mucosa clear , drainage- none, tonsils-  atrophic,  Neck- flexible ,  trachea midline, no stridor , thyroid nl, carotid no bruit Chest - symmetrical excursion , unlabored           Heart/CV- RRR , no murmur , no gallop  , no rub, nl s1 s2                           - JVD- none , edema- none, stasis changes- none, varices- none           Lung- +trace crackles L>R base didn't clear w cough, wheeze- none, cough- none , dullness-none, rub- none           Chest wall-; + asymmetry chest wall at sternum consistent with old rib fractures and rib cage distortion. Abd-  Br/ Gen/ Rectal- Not done, not  indicated Extrem- cyanosis- none, clubbing, none, atrophy- none, strength- nl Neuro- unremarkable to observation

## 2019-10-08 NOTE — Patient Instructions (Signed)
Order- DME [facility]- please service Oxygen concentrator. Continue 2L for sleep and prn.  Order- CXR   Dx nocturnal hypoxemia  Please call if we can help

## 2019-10-13 ENCOUNTER — Ambulatory Visit: Payer: Self-pay | Admitting: Family Medicine

## 2019-10-13 ENCOUNTER — Telehealth: Payer: Self-pay | Admitting: Internal Medicine

## 2019-10-13 NOTE — Assessment & Plan Note (Signed)
Minimal obstructive airways disease. I hear very faint basilar crackles. CXR for now w option to consider HR CT in future. Plan- CXR. [facility] to service concentrator

## 2019-10-13 NOTE — Telephone Encounter (Signed)
I see that Horris Latino called and spoke with the pt and gave cxr results at 12:06 pm today He verbalized understanding of these

## 2019-10-13 NOTE — Assessment & Plan Note (Signed)
He reports no new cardiac issues. Followed by Dr Irish Lack.

## 2019-10-14 DIAGNOSIS — Z23 Encounter for immunization: Secondary | ICD-10-CM | POA: Diagnosis not present

## 2019-10-31 ENCOUNTER — Other Ambulatory Visit: Payer: Self-pay | Admitting: Psychiatry

## 2019-10-31 DIAGNOSIS — F5105 Insomnia due to other mental disorder: Secondary | ICD-10-CM

## 2019-10-31 NOTE — Telephone Encounter (Signed)
20 mg tid 

## 2019-11-05 DIAGNOSIS — H6122 Impacted cerumen, left ear: Secondary | ICD-10-CM | POA: Diagnosis not present

## 2019-11-05 DIAGNOSIS — H9012 Conductive hearing loss, unilateral, left ear, with unrestricted hearing on the contralateral side: Secondary | ICD-10-CM | POA: Diagnosis not present

## 2019-11-05 DIAGNOSIS — J343 Hypertrophy of nasal turbinates: Secondary | ICD-10-CM | POA: Diagnosis not present

## 2019-11-11 DIAGNOSIS — Z23 Encounter for immunization: Secondary | ICD-10-CM | POA: Diagnosis not present

## 2019-11-12 DIAGNOSIS — D692 Other nonthrombocytopenic purpura: Secondary | ICD-10-CM | POA: Diagnosis not present

## 2019-11-12 DIAGNOSIS — I788 Other diseases of capillaries: Secondary | ICD-10-CM | POA: Diagnosis not present

## 2019-11-12 DIAGNOSIS — L723 Sebaceous cyst: Secondary | ICD-10-CM | POA: Diagnosis not present

## 2019-11-12 DIAGNOSIS — L570 Actinic keratosis: Secondary | ICD-10-CM | POA: Diagnosis not present

## 2019-11-12 DIAGNOSIS — L821 Other seborrheic keratosis: Secondary | ICD-10-CM | POA: Diagnosis not present

## 2019-11-19 DIAGNOSIS — Z0000 Encounter for general adult medical examination without abnormal findings: Secondary | ICD-10-CM | POA: Diagnosis not present

## 2019-11-19 DIAGNOSIS — E119 Type 2 diabetes mellitus without complications: Secondary | ICD-10-CM | POA: Diagnosis not present

## 2019-11-19 DIAGNOSIS — E1140 Type 2 diabetes mellitus with diabetic neuropathy, unspecified: Secondary | ICD-10-CM | POA: Diagnosis not present

## 2019-11-19 DIAGNOSIS — I10 Essential (primary) hypertension: Secondary | ICD-10-CM | POA: Diagnosis not present

## 2019-11-19 DIAGNOSIS — E782 Mixed hyperlipidemia: Secondary | ICD-10-CM | POA: Diagnosis not present

## 2019-11-19 DIAGNOSIS — Z1211 Encounter for screening for malignant neoplasm of colon: Secondary | ICD-10-CM | POA: Diagnosis not present

## 2019-11-19 DIAGNOSIS — E559 Vitamin D deficiency, unspecified: Secondary | ICD-10-CM | POA: Diagnosis not present

## 2019-11-25 DIAGNOSIS — N400 Enlarged prostate without lower urinary tract symptoms: Secondary | ICD-10-CM | POA: Diagnosis not present

## 2019-11-25 DIAGNOSIS — I10 Essential (primary) hypertension: Secondary | ICD-10-CM | POA: Diagnosis not present

## 2019-11-25 DIAGNOSIS — E1140 Type 2 diabetes mellitus with diabetic neuropathy, unspecified: Secondary | ICD-10-CM | POA: Diagnosis not present

## 2019-11-25 DIAGNOSIS — E782 Mixed hyperlipidemia: Secondary | ICD-10-CM | POA: Diagnosis not present

## 2019-12-04 ENCOUNTER — Other Ambulatory Visit: Payer: Self-pay

## 2019-12-04 ENCOUNTER — Ambulatory Visit (INDEPENDENT_AMBULATORY_CARE_PROVIDER_SITE_OTHER): Payer: Medicare Other | Admitting: Psychiatry

## 2019-12-04 ENCOUNTER — Encounter: Payer: Self-pay | Admitting: Psychiatry

## 2019-12-04 DIAGNOSIS — M545 Low back pain, unspecified: Secondary | ICD-10-CM

## 2019-12-04 DIAGNOSIS — I2510 Atherosclerotic heart disease of native coronary artery without angina pectoris: Secondary | ICD-10-CM | POA: Diagnosis not present

## 2019-12-04 DIAGNOSIS — F5105 Insomnia due to other mental disorder: Secondary | ICD-10-CM | POA: Diagnosis not present

## 2019-12-04 DIAGNOSIS — F411 Generalized anxiety disorder: Secondary | ICD-10-CM | POA: Diagnosis not present

## 2019-12-04 DIAGNOSIS — F1011 Alcohol abuse, in remission: Secondary | ICD-10-CM | POA: Diagnosis not present

## 2019-12-04 DIAGNOSIS — F99 Mental disorder, not otherwise specified: Secondary | ICD-10-CM | POA: Diagnosis not present

## 2019-12-04 MED ORDER — LORAZEPAM 0.5 MG PO TABS: 1.0000 mg | ORAL_TABLET | Freq: Every day | ORAL | 5 refills | Status: DC

## 2019-12-04 MED ORDER — HYDROXYZINE HCL 10 MG PO TABS: 20.0000 mg | ORAL_TABLET | Freq: Three times a day (TID) | ORAL | 1 refills | Status: DC | PRN

## 2019-12-04 NOTE — Progress Notes (Signed)
Eduardo Watts [id number] March 05, 1946 74 y.o.   Subjective:   Patient ID:  Eduardo Watts. is a 74 y.o. (DOB 25-Sep-1945) male.  Chief Complaint:  Chief Complaint  Patient presents with  . Follow-up    anxiety and depression    Anxiety Patient reports no confusion, decreased concentration, nervous/anxious behavior or suicidal ideas.    Eduardo Watts. presents to the office today for follow-up of sleep, anxiety and alcohol abuse.  seen August 20 , 2020.  No meds changed.  He was not significantly depressed or anxious but was needing medication for insomnia. Meds: Flexeril 10 HS, Gabapentin 300 HS for neuropathy helps a little, lorazepam 0.5mg  2 HS,   sleeps good with that.  GD taking piano lessons from [employer] and doing well.  In 4th grade. W active in politics.  Patient history of TV news and commercials.  Concern over welfare of country.  Overall concern about numbness affecting balance.   Doing fine overall, but neuropathy in feet worries him.  DM 2 but controlled generally.  Working to reduce sugar.  Anxiety is manageable and minimal.  Moderate alcohol 2-3 glasses but denies excess. No complications except with spouse.   W afraid to go to grocery store. Overall he's doing well.  Occ down days.  Still haven't patched things up with sister completely.  Things cold with sister. Prayed to get into control and I think I'm there  RE alcohol and dealing with sister.   Some concern about depending on lorazepam for sleep.  Can't sleep without it.  Do SE.  On it since 2012.  Covid hurting business.  Patient reports stable mood and denies depressed or irritable moods usually.  Patient denies any recent difficulty with anxiety.  Patient denies difficulty with sleep initiation or maintenance. Ativan 1 mg and hydroxyzine help sleep.  Some mild EMA.  8 hours.   Denies appetite disturbance.  Patient reports that energy, appetite,  and motivation have been good.  Patient  denies any difficulty with concentration.  Patient denies any suicidal ideation.  Occ name recall problems.  Past Psychiatric Medication Trials:  Ambien felll out of bed, Ativan, Flexeril, Belsomra, hydrozyzine, Xanax hives, Prosom, trazodone NR, No Seroquel DT B 's suicide, cyclobenzaprine helps,  Klonopin SE, Librium detox,  Lithium, fluoxetine, topiramate, propranolol, Naltrexone   Review of Systems:  Review of Systems  Neurological: Positive for numbness. Negative for tremors and weakness.       Neuropathy in feet affecting  balance  Psychiatric/Behavioral: Negative for agitation, behavioral problems, confusion, decreased concentration, dysphoric mood, hallucinations, self-injury, sleep disturbance and suicidal ideas. The patient is not nervous/anxious and is not hyperactive.     Medications: I have reviewed the patient's current medications.  Current Outpatient Medications  Medication Sig Dispense Refill  . alfuzosin (UROXATRAL) 10 MG 24 hr tablet Take 10 mg by mouth daily.    . Alpha-Lipoic Acid 600 MG CAPS Take 1 capsule (600 mg total) by mouth daily. 30 capsule 0  . B Complex Vitamins (B COMPLEX PO) Take 1 tablet by mouth daily.     Marland Kitchen CALCIUM PO Take 1 tablet by mouth daily.     . carvedilol (COREG) 3.125 MG tablet     . Cholecalciferol (VITAMIN D3) 5000 units CAPS Take 1 capsule by mouth daily.    . cyclobenzaprine (FLEXERIL) 10 MG tablet Take 1 tablet (10 mg total) by mouth at bedtime. 30 tablet 11  . ezetimibe (ZETIA) 10 MG tablet  Take 1 tablet (10 mg total) by mouth daily. 30 tablet 11  . finasteride (PROSCAR) 5 MG tablet Take 1 tablet by mouth every other night    . fluticasone (FLONASE) 50 MCG/ACT nasal spray Place 1 spray into both nostrils daily.     Marland Kitchen gabapentin (NEURONTIN) 300 MG capsule Take 300 mg by mouth daily.    . hydrOXYzine (ATARAX/VISTARIL) 10 MG tablet Take 2 tablets (20 mg total) by mouth 3 (three) times daily as needed for anxiety. 180 tablet 1  . Krill Oil  1000 MG CAPS Take by mouth.    Marland Kitchen LORazepam (ATIVAN) 0.5 MG tablet Take 2 tablets (1 mg total) by mouth at bedtime. 60 tablet 5  . Magnesium 100 MG CAPS Take by mouth.    . Omega-3 Fatty Acids (FISH OIL) 1000 MG CAPS Take 1 capsule by mouth daily.    . rosuvastatin (CRESTOR) 5 MG tablet Take 5 mg by mouth every other day.    . zinc sulfate (ZINC-220) 220 (50 Zn) MG capsule Take 220 mg by mouth daily.     No current facility-administered medications for this visit.    Medication Side Effects: None  Allergies:  Allergies  Allergen Reactions  . Zocor [Simvastatin] Other (See Comments)    Muscle aches  . Trazodone And Nefazodone Other (See Comments)    "crazy"    Past Medical History:  Diagnosis Date  . Anxiety   . Asthma    as a child  . Depression    sees Eduardo Watts  . History of BPH    Eduardo.Dahlstedt  . Hyperlipidemia   . Prediabetes   . Sleep disturbances     Family History  Problem Relation Age of Onset  . Dementia Mother   . Stroke Mother   . Cancer - Prostate Father   . Melanoma Father   . Hyperlipidemia Sister        high cholestrol  . Depression Brother   . Suicidality Brother   . Lung disease Maternal Grandfather   . Heart attack Neg Hx   . Hypertension Neg Hx     Social History   Socioeconomic History  . Marital status: Married    Spouse name: Not on file  . Number of children: 1  . Years of education: 74  . Highest education level: Not on file  Occupational History  . Not on file  Tobacco Use  . Smoking status: Never Smoker  . Smokeless tobacco: Never Used  Substance and Sexual Activity  . Alcohol use: Yes    Alcohol/week: 14.0 - 21.0 standard drinks    Types: 14 - 21 Glasses of wine per week  . Drug use: No  . Sexual activity: Not on file  Other Topics Concern  . Not on file  Social History Narrative   Never smoked   Alcohol yes -2-3 glasses of wine/night   Caffeine- yes 1 cup of coffee/day   Recreational drug use -No   Diet- fruits and  vegetables,less fried food more pasta   Exercise- decrease to fatigue ,does yard work,and  walks occasionally    Occupation -employed Press photographer   Married 1 daughter/ 1 granddaughter   Goes by [employer]" loves animals   Social Determinants of Health   Financial Resource Strain:   . Difficulty of Paying Living Expenses:   Food Insecurity:   . Worried About Charity fundraiser in the Last Year:   . [place] in the Last Year:  Transportation Needs:   . Film/video editor (Medical):   Marland Kitchen Lack of Transportation (Non-Medical):   Physical Activity:   . Days of Exercise per Week:   . Minutes of Exercise per Session:   Stress:   . Feeling of Stress :   Social Connections:   . Frequency of Communication with Friends and Family:   . Frequency of Social Gatherings with Friends and Family:   . Attends Religious Services:   . Active Member of Clubs or Organizations:   . Attends Archivist Meetings:   Marland Kitchen Marital Status:   Intimate Partner Violence:   . Fear of Current or Ex-Partner:   . Emotionally Abused:   Marland Kitchen Physically Abused:   . Sexually Abused:     Past Medical History, Surgical history, Social history, and Family history were reviewed and updated as appropriate.   Please see review of systems for further details on the patient's review from today.   Objective:   Physical Exam:  There were no vitals taken for this visit.  Physical Exam Constitutional:      General: He is not in acute distress.    Appearance: He is well-developed.  Musculoskeletal:        General: No deformity.  Neurological:     Mental Status: He is alert and oriented to person, place, and time.     Motor: No tremor.     Coordination: Coordination normal.     Gait: Gait normal.  Psychiatric:        Attention and Perception: Attention and perception normal.        Mood and Affect: Mood is not anxious or depressed. Affect is not labile, blunt, angry or inappropriate.        Speech: Speech  normal.        Behavior: Behavior normal.        Thought Content: Thought content normal. Thought content does not include homicidal or suicidal ideation. Thought content does not include homicidal or suicidal plan.        Cognition and Memory: Cognition normal.        Judgment: Judgment normal.     Comments: Insight intact. No auditory or visual hallucinations. No delusions.      Lab Review:     Component Value Date/Time   NA 136 05/24/2017 0845   K 4.6 05/24/2017 0845   CL 97 05/24/2017 0845   CO2 18 (L) 05/24/2017 0845   GLUCOSE 148 (H) 05/24/2017 0845   GLUCOSE 140 (H) 04/21/2014 0917   BUN 11 05/24/2017 0845   CREATININE 0.86 05/24/2017 0845   CALCIUM 9.3 05/24/2017 0845   PROT 7.0 04/15/2019 0854   ALBUMIN 4.5 05/24/2017 0845   AST 81 (H) 05/24/2017 0845   ALT 71 (H) 05/24/2017 0845   ALKPHOS 75 05/24/2017 0845   BILITOT 1.1 05/24/2017 0845   GFRNONAA 86 05/24/2017 0845   GFRAA 99 05/24/2017 0845       Component Value Date/Time   WBC 7.6 04/21/2014 0917   RBC 4.60 04/21/2014 0917   HGB 15.5 04/21/2014 0917   HCT 46.0 04/21/2014 0917   PLT 235.0 04/21/2014 0917   MCV 100.0 04/21/2014 0917   MCH 31.8 07/02/2012 2005   MCHC 33.7 04/21/2014 0917   RDW 13.4 04/21/2014 0917   LYMPHSABS 1.1 04/21/2014 0917   MONOABS 0.5 04/21/2014 0917   EOSABS 0.2 04/21/2014 0917   BASOSABS 0.0 04/21/2014 0917    No results found for: POCLITH, LITHIUM   No  results found for: PHENYTOIN, PHENOBARB, VALPROATE, CBMZ   .res Assessment: Plan:    Generalized anxiety disorder  Insomnia due to other mental disorder  Mild alcohol abuse in sustained remission   Disc sleep med options including no meds.  Wife complains about him taking sleep meds.  Chronic insomnia otherwise.  Takes Flexeril every other day and doesn't take hydroxyzine on that night alternates them. Tolerating meds.  Answered questions about diagnosis and prognosis.  Disc this at length.    We discussed the  short-term risks associated with benzodiazepines including sedation and increased fall risk among others.  Discussed long-term side effect risk including dependence, potential withdrawal symptoms, and the potential eventual dose-related risk of dementia.  Newer studies refute the risk Also discussed the consequences of poor sleep on brain and body health.  Also some risk from hydroxyzine also causing short-term problems.  He's doing well without SE so no changes.    Sleep hygiene. Disc timing of sleep meds to help him stay asleep.  Disc option increase hydroxyzine and hold flexeril or vice versa.  Flexeril helps back pain.  Enc sobriety.  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  Supportive therapy over loss of sister in law to [place].  Grieving.    He is not markedly depressed nor markedly anxious at this time.  He does need the medications for insomnia as noted.  Answered questions about vitamin D and inflammatory process and recent studies in med suggesting importance of normal levels for longevity.    Disc importance of managing glucose to reduce rate of progression of neuropathy.  No med changes today  FU 4-6 mos  Lynder Parents, MD, DFAPA     Please see After Visit Summary for patient specific instructions.  Future Appointments  Date Time Provider [place]  10/07/2020  9:00 AM Baird Lyons D, MD [facility] None    No orders of the defined types were placed in this encounter.     -------------------------------

## 2019-12-17 DIAGNOSIS — L720 Epidermal cyst: Secondary | ICD-10-CM | POA: Diagnosis not present

## 2020-02-03 DIAGNOSIS — I10 Essential (primary) hypertension: Secondary | ICD-10-CM | POA: Diagnosis not present

## 2020-02-03 DIAGNOSIS — E782 Mixed hyperlipidemia: Secondary | ICD-10-CM | POA: Diagnosis not present

## 2020-02-03 DIAGNOSIS — E1140 Type 2 diabetes mellitus with diabetic neuropathy, unspecified: Secondary | ICD-10-CM | POA: Diagnosis not present

## 2020-02-03 DIAGNOSIS — N400 Enlarged prostate without lower urinary tract symptoms: Secondary | ICD-10-CM | POA: Diagnosis not present

## 2020-02-22 NOTE — Progress Notes (Signed)
Cardiology Office Note   Date:  02/23/2020   ID:  [name], DOB 1946-04-22, MRN [id number]  PCP:  [name], MD    No chief complaint on file.  CAD  Wt Readings from Last 3 Encounters:  02/23/20 207 lb (93.9 kg)  10/08/19 213 lb (96.6 kg)  06/16/19 211 lb (95.7 kg)       History of Present Illness: Eduardo Watts. is a 74 y.o. male  who had a heart cath in 9/15. This showed moderate RCA disease. There was no significant obstructive disease. His ejection fraction was low normal. He had a pulmonary eval for cough. This is improved with less Coreg.   Hewas concernedin the pastabout AFib and stroke risk. He feltthat he haddaily palpitation. He hit his chest to help the palpitations resolve. Prior w/u for AFib has been negative.  He had given up alcohol since the day of the cath in 2015 but restarted alcohol in 2017.   He has an iron overload syndrome as well.   Hestartednighttime oxygen in 2017- but only uses it half the night, 2L/min. He had another sleep study in 2016 and it was normal. No need for it during the day. 94% during the day. 88% at night. Prescribed by Dr. [name].  In the past, he had decreased his alcohol intake to 2 glasses a day, down from a bottle a day.He has maintained this level. He is working with Dr. [name] to decrease his cravings for alcohol. Acampro was started for this.  He saw Dr. Harrington Challenger. Diagnosed with peripheral neuropathy. Started on Gabapentin.   In Nov 2020, it was noted he was walking 90 minutes/week despite foot numbness.   Since the last visit, he has been working in the yard.  He does well with this activity.  No relief from the neuropathy pain.   The patientdoes nothave symptoms concerning for COVID-19 infection (fever, chills, cough, or new shortness of breath). Received his vaccines for [name].    Continues to walk.    Past Medical History:  Diagnosis Date   Anxiety     Asthma    as a child   Depression    sees Dr [name]   History of BPH    Dr.Dahlstedt   Hyperlipidemia    Prediabetes    Sleep disturbances     Past Surgical History:  Procedure Laterality Date   LEFT HEART CATHETERIZATION WITH CORONARY ANGIOGRAM N/A 04/30/2014   Procedure: LEFT HEART CATHETERIZATION WITH CORONARY ANGIOGRAM;  Surgeon: [name], MD;  Location: [facility] CATH LAB;  Service: Cardiovascular;  Laterality: N/A;   NASAL SEPTUM SURGERY  1975   R cheek benign tumor      1992, again 1998.   TONSILLECTOMY       Current Outpatient Medications  Medication Sig Dispense Refill   alfuzosin (UROXATRAL) 10 MG 24 hr tablet Take 10 mg by mouth daily.     Alpha-Lipoic Acid 600 MG CAPS Take 1 capsule (600 mg total) by mouth daily. 30 capsule 0   B Complex Vitamins (B COMPLEX PO) Take 1 tablet by mouth daily.      CALCIUM PO Take 1 tablet by mouth daily.      carvedilol (COREG) 3.125 MG tablet      Cholecalciferol (VITAMIN D3) 5000 units CAPS Take 1 capsule by mouth daily.     cyclobenzaprine (FLEXERIL) 10 MG tablet Take 1 tablet (10 mg total) by mouth at bedtime. 30 tablet  11   ezetimibe (ZETIA) 10 MG tablet Take 1 tablet (10 mg total) by mouth daily. 30 tablet 11   finasteride (PROSCAR) 5 MG tablet Take 1 tablet by mouth every other night     fluticasone (FLONASE) 50 MCG/ACT nasal spray Place 1 spray into both nostrils daily.      gabapentin (NEURONTIN) 300 MG capsule Take 300 mg by mouth daily.     hydrOXYzine (ATARAX/VISTARIL) 10 MG tablet Take 2 tablets (20 mg total) by mouth 3 (three) times daily as needed for anxiety. 180 tablet 1   Krill Oil 1000 MG CAPS Take by mouth.     LORazepam (ATIVAN) 0.5 MG tablet Take 2 tablets (1 mg total) by mouth at bedtime. 60 tablet 5   Magnesium 100 MG CAPS Take by mouth.     metFORMIN (GLUCOPHAGE) 500 MG tablet Take 500 mg by mouth 2 (two) times daily with a meal.     Omega-3 Fatty Acids (FISH OIL) 1000 MG CAPS  Take 1 capsule by mouth daily.     rosuvastatin (CRESTOR) 5 MG tablet Take 5 mg by mouth every other day.     zinc sulfate (ZINC-220) 220 (50 Zn) MG capsule Take 220 mg by mouth daily.     No current facility-administered medications for this visit.    Allergies:   Zocor [simvastatin] and Trazodone and nefazodone    Social History:  The patient  reports that he has never smoked. He has never used smokeless tobacco. He reports current alcohol use of about 14.0 - 21.0 standard drinks of alcohol per week. He reports that he does not use drugs.   Family History:  The patient's family history includes Cancer - Prostate in his father; Dementia in his mother; Depression in his brother; Hyperlipidemia in his sister; Lung disease in his maternal grandfather; Melanoma in his father; Stroke in his mother; Suicidality in his brother.    ROS:  Please see the history of present illness.   Otherwise, review of systems are positive for numbness in feet.   All other systems are reviewed and negative.    PHYSICAL EXAM: VS:  BP 112/62    Pulse 99    Ht 6\' 2"  (1.88 m)    Wt 207 lb (93.9 kg)    SpO2 94%    BMI 26.58 kg/m  , BMI Body mass index is 26.58 kg/m. GEN: Well nourished, well developed, in no acute distress  HEENT: normal  Neck: no JVD, carotid bruits, or masses Cardiac: RRR; no murmurs, rubs, or gallops,no edema  Respiratory:  clear to auscultation bilaterally, normal work of breathing GI: soft, nontender, nondistended, + BS MS: no deformity or atrophy  Skin: warm and dry, no rash Neuro:  Strength and sensation are intact Psych: euthymic mood, full affect   EKG:   The ekg ordered today demonstrates NSR, RBBB   Recent Labs: No results found for requested labs within last 8760 hours.   Lipid Panel    Component Value Date/Time   CHOL 224 (H) 05/24/2017 0845   TRIG 141 05/24/2017 0845   HDL 57 05/24/2017 0845   CHOLHDL 3.9 05/24/2017 0845   LDLCALC 139 (H) 05/24/2017 0845       Other studies Reviewed: Additional studies/ records that were reviewed today with results demonstrating: A1C , LDL 68 in 4/21.   ASSESSMENT AND PLAN:  1. CAD: Continue aggressive secondary prevention.  Nonobstructive RCA disease noted in the past.  2. DM: Whole food plant based diet recommended.  A1C 7.2.  High fiber diet info given.  Now on metformin.  3. Alcohol use: Could contribute to neuropathy.   4. Hyperlipidemia: Diet changes recommended. 5. Abnormal ECG:  Stable RBBB 6. Neuropathy: limits walking.  Has some improvement with neurontin.   Current medicines are reviewed at length with the patient today.  The patient concerns regarding his medicines were addressed.  The following changes have been made:  No change  Labs/ tests ordered today include:   Orders Placed This Encounter  Procedures   EKG 12-Lead    Recommend 150 minutes/week of aerobic exercise Low fat, low carb, high fiber diet recommended  Disposition:   FU in 6 months   Signed, Larae Grooms, MD  02/23/2020 8:36 AM    [facility] [place], [place], [place]  [address] Phone: [phone]; Fax: [phone]

## 2020-02-23 ENCOUNTER — Ambulatory Visit (INDEPENDENT_AMBULATORY_CARE_PROVIDER_SITE_OTHER): Payer: Medicare Other | Admitting: Interventional Cardiology

## 2020-02-23 ENCOUNTER — Other Ambulatory Visit: Payer: Self-pay

## 2020-02-23 ENCOUNTER — Encounter: Payer: Self-pay | Admitting: Interventional Cardiology

## 2020-02-23 VITALS — BP 112/62 | HR 99 | Ht 74.0 in | Wt 207.0 lb

## 2020-02-23 DIAGNOSIS — I4510 Unspecified right bundle-branch block: Secondary | ICD-10-CM | POA: Diagnosis not present

## 2020-02-23 DIAGNOSIS — E1159 Type 2 diabetes mellitus with other circulatory complications: Secondary | ICD-10-CM

## 2020-02-23 DIAGNOSIS — R9431 Abnormal electrocardiogram [ECG] [EKG]: Secondary | ICD-10-CM | POA: Diagnosis not present

## 2020-02-23 DIAGNOSIS — I2510 Atherosclerotic heart disease of native coronary artery without angina pectoris: Secondary | ICD-10-CM | POA: Diagnosis not present

## 2020-02-23 DIAGNOSIS — E782 Mixed hyperlipidemia: Secondary | ICD-10-CM | POA: Diagnosis not present

## 2020-02-23 DIAGNOSIS — I25119 Atherosclerotic heart disease of native coronary artery with unspecified angina pectoris: Secondary | ICD-10-CM | POA: Diagnosis not present

## 2020-02-23 NOTE — Patient Instructions (Signed)
Medication Instructions:  °Your physician recommends that you continue on your current medications as directed. Please refer to the Current Medication list given to you today. ° °*If you need a refill on your cardiac medications before your next appointment, please call your pharmacy* ° ° °Lab Work: °None ° °If you have labs (blood work) drawn today and your tests are completely normal, you will receive your results only by: °• MyChart Message (if you have MyChart) OR °• A paper copy in the mail °If you have any lab test that is abnormal or we need to change your treatment, we will call you to review the results. ° ° °Testing/Procedures: °None ° ° °Follow-Up: °At [facility], you and your health needs are our priority.  As part of our continuing mission to provide you with exceptional heart care, we have created designated Provider Care Teams.  These Care Teams include your primary Cardiologist (physician) and Advanced Practice Providers (APPs -  Physician Assistants and Nurse Practitioners) who all work together to provide you with the care you need, when you need it. ° °We recommend signing up for the patient portal called "MyChart".  Sign up information is provided on this After Visit Summary.  MyChart is used to connect with patients for Virtual Visits (Telemedicine).  Patients are able to view lab/test results, encounter notes, upcoming appointments, etc.  Non-urgent messages can be sent to your provider as well.   °To learn more about what you can do with MyChart, go to [email or web address].   ° °Your next appointment:   °7 month(s) ° °The format for your next appointment:   °In Person ° °Provider:   °You may see Jayadeep Varanasi, MD or one of the following Advanced Practice Providers on your designated Care Team:   °· Dayna Dunn, PA-C °· Michele Lenze, PA-C ° ° ° °Other Instructions ° °High-Fiber Diet °Fiber, also called dietary fiber, is a type of carbohydrate that is found in fruits, vegetables,  whole grains, and beans. A high-fiber diet can have many health benefits. Your health care provider may recommend a high-fiber diet to help: °· Prevent constipation. Fiber can make your bowel movements more regular. °· Lower your cholesterol. °· Relieve the following conditions: °? Swelling of veins in the anus (hemorrhoids). °? Swelling and irritation (inflammation) of specific areas of the digestive tract (uncomplicated diverticulosis). °? A problem of the large intestine (colon) that sometimes causes pain and diarrhea (irritable bowel syndrome, IBS). °· Prevent overeating as part of a weight-loss plan. °· Prevent heart disease, type 2 diabetes, and certain cancers. °What is my plan? °The recommended daily fiber intake in grams (g) includes: °· 38 g for men age 50 or younger. °· 30 g for men over age 50. °· 25 g for women age 50 or younger. °· 21 g for women over age 50. °You can get the recommended daily intake of dietary fiber by: °· Eating a variety of fruits, vegetables, grains, and beans. °· Taking a fiber supplement, if it is not possible to get enough fiber through your diet. °What do I need to know about a high-fiber diet? °· It is better to get fiber through food sources rather than from fiber supplements. There is not a lot of research about how effective supplements are. °· Always check the fiber content on the nutrition facts label of any prepackaged food. Look for foods that contain 5 g of fiber or more per serving. °· Talk with a diet and nutrition specialist (dietitian) if   you have questions about specific foods that are recommended or not recommended for your medical condition, especially if those foods are not listed below. °· Gradually increase how much fiber you consume. If you increase your intake of dietary fiber too quickly, you may have bloating, cramping, or gas. °· Drink plenty of water. Water helps you to digest fiber. °What are tips for following this plan? °· Eat a wide variety of  high-fiber foods. °· Make sure that half of the grains that you eat each day are whole grains. °· Eat breads and cereals that are made with whole-grain flour instead of refined flour or white flour. °· Eat brown rice, bulgur wheat, or millet instead of white rice. °· Start the day with a breakfast that is high in fiber, such as a cereal that contains 5 g of fiber or more per serving. °· Use beans in place of meat in soups, salads, and pasta dishes. °· Eat high-fiber snacks, such as berries, raw vegetables, nuts, and popcorn. °· Choose whole fruits and vegetables instead of processed forms like juice or sauce. °What foods can I eat? ° °Fruits °Berries. Pears. Apples. Oranges. Avocado. Prunes and raisins. Dried figs. °Vegetables °Sweet potatoes. Spinach. Kale. Artichokes. Cabbage. Broccoli. Cauliflower. Green peas. Carrots. Squash. °Grains °Whole-grain breads. Multigrain cereal. Oats and oatmeal. Brown rice. Barley. Bulgur wheat. Millet. Quinoa. Bran muffins. Popcorn. Rye wafer crackers. °Meats and other proteins °Navy, kidney, and pinto beans. Soybeans. Split peas. Lentils. Nuts and seeds. °Dairy °Fiber-fortified yogurt. °Beverages °Fiber-fortified soy milk. Fiber-fortified orange juice. °Other foods °Fiber bars. °The items listed above may not be a complete list of recommended foods and beverages. Contact a dietitian for more options. °What foods are not recommended? °Fruits °Fruit juice. Cooked, strained fruit. °Vegetables °Fried potatoes. Canned vegetables. Well-cooked vegetables. °Grains °White bread. Pasta made with refined flour. White rice. °Meats and other proteins °Fatty cuts of meat. Fried chicken or fried fish. °Dairy °Milk. Yogurt. Cream cheese. Sour cream. °Fats and oils °Butters. °Beverages °Soft drinks. °Other foods °Cakes and pastries. °The items listed above may not be a complete list of foods and beverages to avoid. Contact a dietitian for more information. °Summary °· Fiber is a type of  carbohydrate. It is found in fruits, vegetables, whole grains, and beans. °· There are many health benefits of eating a high-fiber diet, such as preventing constipation, lowering blood cholesterol, helping with weight loss, and reducing your risk of heart disease, diabetes, and certain cancers. °· Gradually increase your intake of fiber. Increasing too fast can result in cramping, bloating, and gas. Drink plenty of water while you increase your fiber. °· The best sources of fiber include whole fruits and vegetables, whole grains, nuts, seeds, and beans. °This information is not intended to replace advice given to you by your health care provider. Make sure you discuss any questions you have with your health care provider. °Document Revised: 05/28/2017 Document Reviewed: 05/28/2017 °Elsevier Patient Education © 2020 [employer] ° ° °

## 2020-03-03 DIAGNOSIS — E1140 Type 2 diabetes mellitus with diabetic neuropathy, unspecified: Secondary | ICD-10-CM | POA: Diagnosis not present

## 2020-03-03 DIAGNOSIS — I10 Essential (primary) hypertension: Secondary | ICD-10-CM | POA: Diagnosis not present

## 2020-03-03 DIAGNOSIS — N400 Enlarged prostate without lower urinary tract symptoms: Secondary | ICD-10-CM | POA: Diagnosis not present

## 2020-03-03 DIAGNOSIS — E782 Mixed hyperlipidemia: Secondary | ICD-10-CM | POA: Diagnosis not present

## 2020-05-06 ENCOUNTER — Other Ambulatory Visit: Payer: Self-pay

## 2020-05-06 ENCOUNTER — Encounter: Payer: Self-pay | Admitting: Psychiatry

## 2020-05-06 ENCOUNTER — Ambulatory Visit (INDEPENDENT_AMBULATORY_CARE_PROVIDER_SITE_OTHER): Payer: Medicare Other | Admitting: Psychiatry

## 2020-05-06 DIAGNOSIS — F99 Mental disorder, not otherwise specified: Secondary | ICD-10-CM

## 2020-05-06 DIAGNOSIS — F5105 Insomnia due to other mental disorder: Secondary | ICD-10-CM | POA: Diagnosis not present

## 2020-05-06 DIAGNOSIS — I2510 Atherosclerotic heart disease of native coronary artery without angina pectoris: Secondary | ICD-10-CM | POA: Diagnosis not present

## 2020-05-06 DIAGNOSIS — M545 Low back pain, unspecified: Secondary | ICD-10-CM

## 2020-05-06 DIAGNOSIS — F411 Generalized anxiety disorder: Secondary | ICD-10-CM | POA: Diagnosis not present

## 2020-05-06 MED ORDER — CYCLOBENZAPRINE HCL 10 MG PO TABS: 10.0000 mg | ORAL_TABLET | Freq: Every day | ORAL | 11 refills | Status: DC

## 2020-05-06 MED ORDER — LORAZEPAM 0.5 MG PO TABS: 1.0000 mg | ORAL_TABLET | Freq: Every day | ORAL | 5 refills | Status: DC

## 2020-05-06 MED ORDER — HYDROXYZINE HCL 10 MG PO TABS: 20.0000 mg | ORAL_TABLET | Freq: Three times a day (TID) | ORAL | 1 refills | Status: DC | PRN

## 2020-05-06 NOTE — Progress Notes (Signed)
[place] [id number] [birth year] 74 y.o.   Subjective:   Patient ID:  Eduardo Watts. is a 74 y.o. (DOB [birth year]) male.  Chief Complaint:  Chief Complaint  Patient presents with  . Follow-up  . Sleeping Problem    Anxiety Patient reports no chest pain, confusion, decreased concentration, nervous/anxious behavior or suicidal ideas.    Eduardo Watts. presents to the office today for follow-up of sleep, anxiety and alcohol abuse.  seen 11/2019.  No meds changed.  He was not significantly depressed or anxious but was needing medication for insomnia. Meds: Flexeril 10 HS, Gabapentin 300 HS for neuropathy helps a little, lorazepam 0.5mg  2 HS,   sleeps good with that.  05/06/20 appt noted: CO neuropathy in feet with numbness and tingling in feet.   Seeing neurologist.  No change in meds so far.  He's prediabetic. This is causing a balance issue.   Sleep well with lorazepam.  Vaccinated.  Patient reports stable mood and denies depressed or irritable moods usually.  Patient denies any recent difficulty with anxiety.  Patient denies difficulty with sleep initiation or maintenance. Ativan 1 mg and hydroxyzine help sleep.  Some mild EMA.  8 hours.   Denies appetite disturbance.  Patient reports that energy, appetite,  and motivation have been good.  Patient denies any difficulty with concentration.  Patient denies any suicidal ideation.  Occ name recall problems.  Past Psychiatric Medication Trials:  Ambien felll out of bed, Ativan, Flexeril, Belsomra, hydrozyzine, Xanax hives, Prosom, trazodone NR, No Seroquel DT B 's suicide, cyclobenzaprine helps,  Klonopin SE, Librium detox,  Lithium, fluoxetine, topiramate, propranolol, Naltrexone   Review of Systems:  Review of Systems  Cardiovascular: Negative for chest pain.  Neurological: Positive for numbness. Negative for tremors and weakness.       Neuropathy in feet affecting  balance  Psychiatric/Behavioral: Negative  for agitation, behavioral problems, confusion, decreased concentration, dysphoric mood, hallucinations, self-injury, sleep disturbance and suicidal ideas. The patient is not nervous/anxious and is not hyperactive.     Medications: I have reviewed the patient's current medications.  Current Outpatient Medications  Medication Sig Dispense Refill  . alfuzosin (UROXATRAL) 10 MG 24 hr tablet Take 10 mg by mouth daily.    . Alpha-Lipoic Acid 600 MG CAPS Take 1 capsule (600 mg total) by mouth daily. 30 capsule 0  . B Complex Vitamins (B COMPLEX PO) Take 1 tablet by mouth daily.     Marland Kitchen CALCIUM PO Take 1 tablet by mouth daily.     . carvedilol (COREG) 3.125 MG tablet     . Cholecalciferol (VITAMIN D3) 5000 units CAPS Take 1 capsule by mouth daily.    . cyclobenzaprine (FLEXERIL) 10 MG tablet Take 1 tablet (10 mg total) by mouth at bedtime. 30 tablet 11  . ezetimibe (ZETIA) 10 MG tablet Take 1 tablet (10 mg total) by mouth daily. 30 tablet 11  . finasteride (PROSCAR) 5 MG tablet Take 1 tablet by mouth every other night    . fluticasone (FLONASE) 50 MCG/ACT nasal spray Place 1 spray into both nostrils daily.     Marland Kitchen gabapentin (NEURONTIN) 300 MG capsule Take 300 mg by mouth daily.    . hydrOXYzine (ATARAX/VISTARIL) 10 MG tablet Take 2 tablets (20 mg total) by mouth 3 (three) times daily as needed for anxiety. 180 tablet 1  . Krill Oil 1000 MG CAPS Take by mouth.    Marland Kitchen LORazepam (ATIVAN) 0.5 MG tablet Take 2 tablets (1  mg total) by mouth at bedtime. 60 tablet 5  . Magnesium 100 MG CAPS Take by mouth.    . metFORMIN (GLUCOPHAGE) 500 MG tablet Take 500 mg by mouth 2 (two) times daily with a meal.    . Omega-3 Fatty Acids (FISH OIL) 1000 MG CAPS Take 1 capsule by mouth daily.    . rosuvastatin (CRESTOR) 5 MG tablet Take 5 mg by mouth every other day.    . zinc sulfate (ZINC-220) 220 (50 Zn) MG capsule Take 220 mg by mouth daily.     No current facility-administered medications for this visit.     Medication Side Effects: None  Allergies:  Allergies  Allergen Reactions  . Zocor [Simvastatin] Other (See Comments)    Muscle aches  . Trazodone And Nefazodone Other (See Comments)    "crazy"    Past Medical History:  Diagnosis Date  . Anxiety   . Asthma    as a child  . Depression    sees Dr [name]  . History of BPH    Dr.Dahlstedt  . Hyperlipidemia   . Prediabetes   . Sleep disturbances     Family History  Problem Relation Age of Onset  . Dementia Mother   . Stroke Mother   . Cancer - Prostate Father   . Melanoma Father   . Hyperlipidemia Sister        high cholestrol  . Depression Brother   . Suicidality Brother   . Lung disease Maternal Grandfather   . Heart attack Neg Hx   . Hypertension Neg Hx     Social History   Socioeconomic History  . Marital status: Married    Spouse name: Not on file  . Number of children: 1  . Years of education: 14  . Highest education level: Not on file  Occupational History  . Not on file  Tobacco Use  . Smoking status: Never Smoker  . Smokeless tobacco: Never Used  Vaping Use  . Vaping Use: Never used  Substance and Sexual Activity  . Alcohol use: Yes    Alcohol/week: 14.0 - 21.0 standard drinks    Types: 14 - 21 Glasses of wine per week  . Drug use: No  . Sexual activity: Not on file  Other Topics Concern  . Not on file  Social History Narrative   Never smoked   Alcohol yes -2-3 glasses of wine/night   Caffeine- yes 1 cup of coffee/day   Recreational drug use -No   Diet- fruits and vegetables,less fried food more pasta   Exercise- decrease to fatigue ,does yard work,and  walks occasionally    Occupation -employed Press photographer   Married 1 daughter/ 1 granddaughter   Goes by Eduardo Watts" loves animals   Social Determinants of Health   Financial Resource Strain:   . Difficulty of Paying Living Expenses: Not on file  Food Insecurity:   . Worried About Charity fundraiser in the Last Year: Not on file  . Ran Out of  Food in the Last Year: Not on file  Transportation Needs:   . Lack of Transportation (Medical): Not on file  . Lack of Transportation (Non-Medical): Not on file  Physical Activity:   . Days of Exercise per Week: Not on file  . Minutes of Exercise per Session: Not on file  Stress:   . Feeling of Stress : Not on file  Social Connections:   . Frequency of Communication with Friends and Family: Not on file  .  Frequency of Social Gatherings with Friends and Family: Not on file  . Attends Religious Services: Not on file  . Active Member of Clubs or Organizations: Not on file  . Attends Archivist Meetings: Not on file  . Marital Status: Not on file  Intimate Partner Violence:   . Fear of Current or Ex-Partner: Not on file  . Emotionally Abused: Not on file  . Physically Abused: Not on file  . Sexually Abused: Not on file    Past Medical History, Surgical history, Social history, and Family history were reviewed and updated as appropriate.   Please see review of systems for further details on the patient's review from today.   Objective:   Physical Exam:  There were no vitals taken for this visit.  Physical Exam Constitutional:      General: He is not in acute distress.    Appearance: He is well-developed.  Musculoskeletal:        General: No deformity.  Neurological:     Mental Status: He is alert and oriented to person, place, and time.     Motor: No tremor.     Coordination: Coordination normal.     Gait: Gait normal.  Psychiatric:        Attention and Perception: Attention and perception normal.        Mood and Affect: Mood is not anxious or depressed. Affect is not labile, blunt, angry or inappropriate.        Speech: Speech normal. Speech is not rapid and pressured.        Behavior: Behavior normal.        Thought Content: Thought content normal. Thought content does not include homicidal or suicidal ideation. Thought content does not include homicidal or  suicidal plan.        Cognition and Memory: Cognition normal.        Judgment: Judgment normal.     Comments: Insight intact. No auditory or visual hallucinations. No delusions.      Lab Review:     Component Value Date/Time   NA 136 05/24/2017 0845   K 4.6 05/24/2017 0845   CL 97 05/24/2017 0845   CO2 18 (L) 05/24/2017 0845   GLUCOSE 148 (H) 05/24/2017 0845   GLUCOSE 140 (H) 04/21/2014 0917   BUN 11 05/24/2017 0845   CREATININE 0.86 05/24/2017 0845   CALCIUM 9.3 05/24/2017 0845   PROT 7.0 04/15/2019 0854   ALBUMIN 4.5 05/24/2017 0845   AST 81 (H) 05/24/2017 0845   ALT 71 (H) 05/24/2017 0845   ALKPHOS 75 05/24/2017 0845   BILITOT 1.1 05/24/2017 0845   GFRNONAA 86 05/24/2017 0845   GFRAA 99 05/24/2017 0845       Component Value Date/Time   WBC 7.6 04/21/2014 0917   RBC 4.60 04/21/2014 0917   HGB 15.5 04/21/2014 0917   HCT 46.0 04/21/2014 0917   PLT 235.0 04/21/2014 0917   MCV 100.0 04/21/2014 0917   MCH 31.8 07/02/2012 2005   MCHC 33.7 04/21/2014 0917   RDW 13.4 04/21/2014 0917   LYMPHSABS 1.1 04/21/2014 0917   MONOABS 0.5 04/21/2014 0917   EOSABS 0.2 04/21/2014 0917   BASOSABS 0.0 04/21/2014 0917    No results found for: POCLITH, LITHIUM   No results found for: PHENYTOIN, PHENOBARB, VALPROATE, CBMZ   .res Assessment: Plan:    Generalized anxiety disorder  Insomnia due to other mental disorder - Plan: hydrOXYzine (ATARAX/VISTARIL) 10 MG tablet, LORazepam (ATIVAN) 0.5 MG tablet  Bilateral  low back pain without sciatica, unspecified chronicity - Plan: cyclobenzaprine (FLEXERIL) 10 MG tablet   Disc sleep med options including no meds.  Wife complains about him taking sleep meds.  Chronic insomnia otherwise.  Takes Flexeril every other day and doesn't take hydroxyzine on that night alternates them. Tolerating meds.  Answered questions about diagnosis and prognosis.  Disc this at length.    We discussed the short-term risks associated with benzodiazepines  including sedation and increased fall risk among others.  Discussed long-term side effect risk including dependence, potential withdrawal symptoms, and the potential eventual dose-related risk of dementia.  Newer studies refute the risk Also discussed the consequences of poor sleep on brain and body health.  Also some risk from hydroxyzine also causing short-term problems.  He's doing well without SE so no changes.    Sleep hygiene. Disc timing of sleep meds to help him stay asleep.  Disc option increase hydroxyzine and hold flexeril or vice versa.  Flexeril helps back pain.  Lorazepam helps sleep as well.  Manages well with 6 hours of sleep.  Disc risk of multiple sleepers.   Enc maintain sobriety.    He is not markedly depressed nor markedly anxious at this time.  He does need the medications for insomnia as noted.  Answered questions about vitamin D and inflammatory process and recent studies in med suggesting importance of normal levels for longevity.    Disc importance of managing glucose to reduce rate of progression of neuropathy.  No med changes today  FU 6 mos  Lynder Parents, MD, DFAPA     Please see After Visit Summary for patient specific instructions.  Future Appointments  Date Time Provider [place]  10/07/2020  9:00 AM Baird Lyons D, MD [facility] None    No orders of the defined types were placed in this encounter.     -------------------------------

## 2020-05-20 DIAGNOSIS — L812 Freckles: Secondary | ICD-10-CM | POA: Diagnosis not present

## 2020-05-20 DIAGNOSIS — L821 Other seborrheic keratosis: Secondary | ICD-10-CM | POA: Diagnosis not present

## 2020-05-20 DIAGNOSIS — D1801 Hemangioma of skin and subcutaneous tissue: Secondary | ICD-10-CM | POA: Diagnosis not present

## 2020-05-20 DIAGNOSIS — L570 Actinic keratosis: Secondary | ICD-10-CM | POA: Diagnosis not present

## 2020-05-25 DIAGNOSIS — E559 Vitamin D deficiency, unspecified: Secondary | ICD-10-CM | POA: Diagnosis not present

## 2020-05-25 DIAGNOSIS — G629 Polyneuropathy, unspecified: Secondary | ICD-10-CM | POA: Diagnosis not present

## 2020-05-25 DIAGNOSIS — E782 Mixed hyperlipidemia: Secondary | ICD-10-CM | POA: Diagnosis not present

## 2020-05-25 DIAGNOSIS — E1140 Type 2 diabetes mellitus with diabetic neuropathy, unspecified: Secondary | ICD-10-CM | POA: Diagnosis not present

## 2020-05-25 DIAGNOSIS — I10 Essential (primary) hypertension: Secondary | ICD-10-CM | POA: Diagnosis not present

## 2020-05-25 DIAGNOSIS — Z23 Encounter for immunization: Secondary | ICD-10-CM | POA: Diagnosis not present

## 2020-05-25 DIAGNOSIS — R2689 Other abnormalities of gait and mobility: Secondary | ICD-10-CM | POA: Diagnosis not present

## 2020-06-15 DIAGNOSIS — R634 Abnormal weight loss: Secondary | ICD-10-CM | POA: Diagnosis not present

## 2020-06-15 DIAGNOSIS — R202 Paresthesia of skin: Secondary | ICD-10-CM | POA: Diagnosis not present

## 2020-06-15 DIAGNOSIS — G510 Bell's palsy: Secondary | ICD-10-CM | POA: Diagnosis not present

## 2020-06-15 DIAGNOSIS — R270 Ataxia, unspecified: Secondary | ICD-10-CM | POA: Diagnosis not present

## 2020-06-15 DIAGNOSIS — G609 Hereditary and idiopathic neuropathy, unspecified: Secondary | ICD-10-CM | POA: Diagnosis not present

## 2020-06-15 DIAGNOSIS — E559 Vitamin D deficiency, unspecified: Secondary | ICD-10-CM | POA: Diagnosis not present

## 2020-06-15 DIAGNOSIS — G5621 Lesion of ulnar nerve, right upper limb: Secondary | ICD-10-CM | POA: Diagnosis not present

## 2020-06-15 DIAGNOSIS — G603 Idiopathic progressive neuropathy: Secondary | ICD-10-CM | POA: Diagnosis not present

## 2020-06-15 DIAGNOSIS — G5601 Carpal tunnel syndrome, right upper limb: Secondary | ICD-10-CM | POA: Diagnosis not present

## 2020-06-15 DIAGNOSIS — G2581 Restless legs syndrome: Secondary | ICD-10-CM | POA: Diagnosis not present

## 2020-06-15 DIAGNOSIS — M5417 Radiculopathy, lumbosacral region: Secondary | ICD-10-CM | POA: Diagnosis not present

## 2020-07-09 DIAGNOSIS — R3912 Poor urinary stream: Secondary | ICD-10-CM | POA: Diagnosis not present

## 2020-07-09 DIAGNOSIS — B356 Tinea cruris: Secondary | ICD-10-CM | POA: Diagnosis not present

## 2020-07-09 DIAGNOSIS — N401 Enlarged prostate with lower urinary tract symptoms: Secondary | ICD-10-CM | POA: Diagnosis not present

## 2020-07-09 DIAGNOSIS — N486 Induration penis plastica: Secondary | ICD-10-CM | POA: Diagnosis not present

## 2020-07-09 DIAGNOSIS — N5201 Erectile dysfunction due to arterial insufficiency: Secondary | ICD-10-CM | POA: Diagnosis not present

## 2020-07-26 DIAGNOSIS — Z23 Encounter for immunization: Secondary | ICD-10-CM | POA: Diagnosis not present

## 2020-08-03 ENCOUNTER — Telehealth: Payer: Self-pay

## 2020-08-03 NOTE — Telephone Encounter (Signed)
Prior Authorization submitted and approved for HYDROXYZINE 10 MG tablets #180/30 day supply, patient may fill 90 day supply at a time. Effective 08/03/2020 with [facility] Medicare Part D.

## 2020-08-11 ENCOUNTER — Other Ambulatory Visit: Payer: Self-pay | Admitting: Psychiatry

## 2020-08-11 DIAGNOSIS — F99 Mental disorder, not otherwise specified: Secondary | ICD-10-CM

## 2020-08-11 DIAGNOSIS — F5105 Insomnia due to other mental disorder: Secondary | ICD-10-CM

## 2020-08-18 ENCOUNTER — Other Ambulatory Visit: Payer: Self-pay | Admitting: Psychiatry

## 2020-08-18 DIAGNOSIS — M545 Low back pain, unspecified: Secondary | ICD-10-CM

## 2020-08-26 ENCOUNTER — Encounter: Payer: Self-pay | Admitting: Psychiatry

## 2020-08-26 ENCOUNTER — Ambulatory Visit (INDEPENDENT_AMBULATORY_CARE_PROVIDER_SITE_OTHER): Payer: Medicare Other | Admitting: Psychiatry

## 2020-08-26 ENCOUNTER — Other Ambulatory Visit: Payer: Self-pay

## 2020-08-26 DIAGNOSIS — M545 Low back pain, unspecified: Secondary | ICD-10-CM

## 2020-08-26 DIAGNOSIS — F411 Generalized anxiety disorder: Secondary | ICD-10-CM

## 2020-08-26 DIAGNOSIS — F5105 Insomnia due to other mental disorder: Secondary | ICD-10-CM | POA: Diagnosis not present

## 2020-08-26 DIAGNOSIS — R42 Dizziness and giddiness: Secondary | ICD-10-CM

## 2020-08-26 DIAGNOSIS — F99 Mental disorder, not otherwise specified: Secondary | ICD-10-CM

## 2020-08-26 MED ORDER — HYDROXYZINE HCL 10 MG PO TABS: ORAL_TABLET | ORAL | 1 refills | Status: DC

## 2020-08-26 MED ORDER — LORAZEPAM 0.5 MG PO TABS: 1.0000 mg | ORAL_TABLET | Freq: Every day | ORAL | 5 refills | Status: DC

## 2020-08-26 NOTE — Progress Notes (Signed)
Eloise Rafique [id number] September 10, 1945 75 y.o.   Subjective:   Patient ID:  Eduardo Watts. is a 75 y.o. (DOB May 03, 1946) male.  Chief Complaint:  Chief Complaint  Patient presents with  . Follow-up  . Anxiety  . Stress    health    Anxiety Symptoms include dizziness. Patient reports no chest pain, confusion, decreased concentration, nervous/anxious behavior or suicidal ideas.    Eduardo Watts. Eduardo Watts presents to the office today for follow-up of sleep, anxiety and alcohol abuse.  seen 11/2019.  No meds changed.  He was not significantly depressed or anxious but was needing medication for insomnia. Meds: Flexeril 10 HS, Gabapentin 300 HS for neuropathy helps a little, lorazepam 0.5mg  2 HS,   sleeps good with that.  05/06/20 appt noted: CO neuropathy in feet with numbness and tingling in feet.   Seeing neurologist.  No change in meds so far.  He's prediabetic. This is causing a balance issue.   Sleep well with lorazepam.  Vaccinated. Plan: no changes  08/26/2020 appt noted: [name] Dizziness when gets up in the AM.  Neuropathy problems.  Increased gabapentin which he seemed to tolerate.  Fall the other day.  Energy not as good.  Lorazepam & hydroxyzine still helps sleep.  Celebrated 65 year anniversary party.  Good time.    Wife concerned about his meds and wonders if they are affecting his meds.    Patient reports stable mood and denies depressed or irritable moods usually.  Patient denies any recent difficulty with anxiety.  Patient denies difficulty with sleep initiation or maintenance. Ativan 1 mg and hydroxyzine help sleep.  Some mild EMA.  8 hours.   Denies appetite disturbance.  Patient reports that appetite,  and motivation have been good.  Patient denies any difficulty with concentration.  Patient denies any suicidal ideation.  Occ name recall problems.  Past Psychiatric Medication Trials:  Ambien felll out of bed, Ativan, Flexeril, Belsomra,  hydrozyzine, Xanax hives, Prosom, trazodone NR, No Seroquel DT B 's suicide, cyclobenzaprine helps,  Klonopin SE, Librium detox,  Lithium, fluoxetine, topiramate, propranolol, Naltrexone   Review of Systems:  Review of Systems  Cardiovascular: Negative for chest pain.  Neurological: Positive for dizziness and numbness. Negative for tremors and weakness.       Neuropathy in feet affecting  balance  Psychiatric/Behavioral: Negative for agitation, behavioral problems, confusion, decreased concentration, dysphoric mood, hallucinations, self-injury, sleep disturbance and suicidal ideas. The patient is not nervous/anxious and is not hyperactive.     Medications: I have reviewed the patient's current medications.  Current Outpatient Medications  Medication Sig Dispense Refill  . alfuzosin (UROXATRAL) 10 MG 24 hr tablet Take 10 mg by mouth daily.    . Alpha-Lipoic Acid 600 MG CAPS Take 1 capsule (600 mg total) by mouth daily. 30 capsule 0  . B Complex Vitamins (B COMPLEX PO) Take 1 tablet by mouth daily.     Marland Kitchen CALCIUM PO Take 1 tablet by mouth daily.     . carvedilol (COREG) 3.125 MG tablet     . Cholecalciferol (VITAMIN D3) 5000 units CAPS Take 1 capsule by mouth daily.    . cyclobenzaprine (FLEXERIL) 10 MG tablet TAKE 1 TABLET BY MOUTH AT BEDTIME 30 tablet 0  . ezetimibe (ZETIA) 10 MG tablet Take 1 tablet (10 mg total) by mouth daily. 30 tablet 11  . finasteride (PROSCAR) 5 MG tablet Take 1 tablet by mouth every other night    . fluticasone (  FLONASE) 50 MCG/ACT nasal spray Place 1 spray into both nostrils daily.     Marland Kitchen gabapentin (NEURONTIN) 300 MG capsule Take 300 mg by mouth daily.    Eduardo Watts Oil 1000 MG CAPS Take by mouth.    . Magnesium 100 MG CAPS Take by mouth.    . metFORMIN (GLUCOPHAGE) 500 MG tablet Take 500 mg by mouth 2 (two) times daily with a meal.    . Omega-3 Fatty Acids (FISH OIL) 1000 MG CAPS Take 1 capsule by mouth daily.    . rosuvastatin (CRESTOR) 5 MG tablet Take 5 mg by  mouth every other day.    . zinc sulfate 220 (50 Zn) MG capsule Take 220 mg by mouth daily.    . hydrOXYzine (ATARAX/VISTARIL) 10 MG tablet TAKE 2 TABLETS BY MOUTH THREE TIMES DAILY AS NEEDED FOR ANXIETY 180 tablet 1  . LORazepam (ATIVAN) 0.5 MG tablet Take 2 tablets (1 mg total) by mouth at bedtime. 60 tablet 5   No current facility-administered medications for this visit.    Medication Side Effects: None  Allergies:  Allergies  Allergen Reactions  . Zocor [Simvastatin] Other (See Comments)    Muscle aches  . Trazodone And Nefazodone Other (See Comments)    "crazy"    Past Medical History:  Diagnosis Date  . Anxiety   . Asthma    as a child  . Depression    sees Dr [name]  . History of BPH    Dr.Dahlstedt  . Hyperlipidemia   . Prediabetes   . Sleep disturbances     Family History  Problem Relation Age of Onset  . Dementia Mother   . Stroke Mother   . Cancer - Prostate Father   . Melanoma Father   . Hyperlipidemia Sister        high cholestrol  . Depression Brother   . Suicidality Brother   . Lung disease Maternal Grandfather   . Heart attack Neg Hx   . Hypertension Neg Hx     Social History   Socioeconomic History  . Marital status: Married    Spouse name: Not on file  . Number of children: 1  . Years of education: 21  . Highest education level: Not on file  Occupational History  . Not on file  Tobacco Use  . Smoking status: Never Smoker  . Smokeless tobacco: Never Used  Vaping Use  . Vaping Use: Never used  Substance and Sexual Activity  . Alcohol use: Yes    Alcohol/week: 14.0 - 21.0 standard drinks    Types: 14 - 21 Glasses of wine per week  . Drug use: No  . Sexual activity: Not on file  Other Topics Concern  . Not on file  Social History Narrative   Never smoked   Alcohol yes -2-3 glasses of wine/night   Caffeine- yes 1 cup of coffee/day   Recreational drug use -No   Diet- fruits and vegetables,less fried food more pasta   Exercise-  decrease to fatigue ,does yard work,and  walks occasionally    Occupation -employed Press photographer   Married 1 daughter/ 1 granddaughter   Goes by [employer]" loves animals   Social Determinants of Health   Financial Resource Strain: Not on file  Food Insecurity: Not on file  Transportation Needs: Not on file  Physical Activity: Not on file  Stress: Not on file  Social Connections: Not on file  Intimate Partner Violence: Not on file    Past Medical History,  Surgical history, Social history, and Family history were reviewed and updated as appropriate.   Please see review of systems for further details on the patient's review from today.   Objective:   Physical Exam:  There were no vitals taken for this visit.  Physical Exam Constitutional:      General: He is not in acute distress.    Appearance: He is well-developed.  Musculoskeletal:        General: No deformity.  Neurological:     Mental Status: He is alert and oriented to person, place, and time.     Motor: No tremor.     Coordination: Coordination normal.     Gait: Gait abnormal.  Psychiatric:        Attention and Perception: Attention and perception normal.        Mood and Affect: Mood is not anxious or depressed. Affect is not labile, blunt, angry or inappropriate.        Speech: Speech normal. Speech is not rapid and pressured.        Behavior: Behavior normal.        Thought Content: Thought content normal. Thought content does not include homicidal or suicidal ideation. Thought content does not include homicidal or suicidal plan.        Cognition and Memory: Cognition normal.        Judgment: Judgment normal.     Comments: Insight intact. No auditory or visual hallucinations. No delusions.      Lab Review:     Component Value Date/Time   NA 136 05/24/2017 0845   K 4.6 05/24/2017 0845   CL 97 05/24/2017 0845   CO2 18 (L) 05/24/2017 0845   GLUCOSE 148 (H) 05/24/2017 0845   GLUCOSE 140 (H) 04/21/2014 0917   BUN 11  05/24/2017 0845   CREATININE 0.86 05/24/2017 0845   CALCIUM 9.3 05/24/2017 0845   PROT 7.0 04/15/2019 0854   ALBUMIN 4.5 05/24/2017 0845   AST 81 (H) 05/24/2017 0845   ALT 71 (H) 05/24/2017 0845   ALKPHOS 75 05/24/2017 0845   BILITOT 1.1 05/24/2017 0845   GFRNONAA 86 05/24/2017 0845   GFRAA 99 05/24/2017 0845       Component Value Date/Time   WBC 7.6 04/21/2014 0917   RBC 4.60 04/21/2014 0917   HGB 15.5 04/21/2014 0917   HCT 46.0 04/21/2014 0917   PLT 235.0 04/21/2014 0917   MCV 100.0 04/21/2014 0917   MCH 31.8 07/02/2012 2005   MCHC 33.7 04/21/2014 0917   RDW 13.4 04/21/2014 0917   LYMPHSABS 1.1 04/21/2014 0917   MONOABS 0.5 04/21/2014 0917   EOSABS 0.2 04/21/2014 0917   BASOSABS 0.0 04/21/2014 0917    No results found for: POCLITH, LITHIUM   No results found for: PHENYTOIN, PHENOBARB, VALPROATE, CBMZ   .res Assessment: Plan:    Generalized anxiety disorder  Insomnia due to other mental disorder - Plan: hydrOXYzine (ATARAX/VISTARIL) 10 MG tablet, LORazepam (ATIVAN) 0.5 MG tablet  Bilateral low back pain without sciatica, unspecified chronicity  Dizziness on standing   Disc sleep med options including no meds.  Wife complains about him taking sleep meds.  Chronic insomnia otherwise.  Takes Flexeril every other day and doesn't take hydroxyzine on that night alternates them. Tolerating meds.  Answered questions about diagnosis and prognosis.  Disc this at length.  He has well controled psych sx.   Answered questions about purpose of each med.  We discussed the short-term risks associated with benzodiazepines including sedation and  increased fall risk among others.  Discussed long-term side effect risk including dependence, potential withdrawal symptoms, and the potential eventual dose-related risk of dementia.  Newer studies refute the risk Also discussed the consequences of poor sleep on brain and body health.  Also some risk from hydroxyzine also causing short-term  problems.  He's doing well without SE so no changes.    Sleep hygiene. Disc timing of sleep meds to help him stay asleep.  Disc option increase hydroxyzine and hold flexeril or vice versa.  Flexeril helps back pain.  Lorazepam helps sleep as well.  Manages well with 6 hours of sleep.  Disc risk of multiple sleepers.  Overall he's doing well and sleep meds due not appear to be cause of sleepiness.  But discuss the reasons for it.  Extensive discussion of distinguishing dizziness related HS meds vs orthostatic.vs neuropathy.  Disc importance of managing glucose to reduce rate of progression of neuropathy.  Enc maintain sobriety.    He is not markedly depressed nor markedly anxious at this time.  He does need the medications for insomnia as noted.  Answered questions about vitamin D and inflammatory process and recent studies in med suggesting importance of normal levels for longevity.    No med changes today  FU 6 mos  Lynder Parents, MD, DFAPA     Please see After Visit Summary for patient specific instructions.  Future Appointments  Date Time Provider [place]  09/24/2020  8:00 AM [name], MD [facility]  10/07/2020  9:00 AM Baird Lyons D, MD [facility] None    No orders of the defined types were placed in this encounter.     -------------------------------

## 2020-09-24 ENCOUNTER — Ambulatory Visit: Payer: Medicare Other | Admitting: Interventional Cardiology

## 2020-10-01 ENCOUNTER — Other Ambulatory Visit: Payer: Self-pay | Admitting: Psychiatry

## 2020-10-01 DIAGNOSIS — M545 Low back pain, unspecified: Secondary | ICD-10-CM

## 2020-10-06 NOTE — Progress Notes (Signed)
HPI male never smoker followed for cough, mild obstructive airways disease, complicated by CAD, palpitations, old right rib fractures PFT 07/13/14- minimal obstructive airways disease, insignificant response to bronchodilator, normal lung volumes and diffusion. Echocardiogram 08/11/2013-normal LV function, normal valves, no evidence of prior MI Walk Test-04/02/2015-he walked 555 feet starting with saturation 93%, falling to a nadir of 92% on room air with no oxygen desaturation. Pulse rate 100-109, regular. ONOX- 04/25/15- qualified for sleep O2 with 50 minutes<= 88% room air .NPSG 09/29/15- WNL, AHI 3/ hr, with desat to 85%, mean sat 90.1% ---------------------------------------------------------------------- 10/08/19-75 year old male never smoker followed for Chronic hypoxic respiratory / nocturnal hypoxemia, cough, mild obstructive airways disease, old right rib fractures complicated by CAD, palpitations, Insomnia/ Dr [name] O2 2 L/sleep/[facility]  -----f/u Nocturnal hypoxia.O2 2l/min at night. Breathing is at patient's baseline.  Concentrator working well. He often takes it off part way through the night. Denies routine cough or unexpected dyspnea with exertion. Denies cough, wheeze or acute respiratory symptoms. Continues cardiology f/u/ Dr Irish Lack. Works on ETOH hx w Dr [name].  10/07/20- 75 year old male never smoker followed for Chronic hypoxic respiratory / nocturnal hypoxemia, cough, mild Obstructive Airways Disease, old right rib fractures complicated by CAD, palpitations, Insomnia/ Anxiet/ ETOH/ Dr [name], DM2, Hyperlipidemia, Neuropathy O2 2 L/sleep/[facility]  Covid vax-3 Phizer Flu vax-had On gabapentin and metformin for prediabetes with neuropathy. Has fallen, and comes wearing bandage R calf. Also has hydroxyzine, lorazepam which help with sleep. No changes in breathing during the day- denies cough, wheeze. Continues to wear oxygen for sleep and tells me he wears it most of each night. CXR  3/321- IMPRESSION: No acute abnormality of the lungs.   ROS-see HPI  + = positive Constitutional:   No-   weight loss, night sweats, fevers, chills, fatigue, lassitude. HEENT:   No-  headaches, difficulty swallowing, tooth/dental problems, sore throat,       No-  sneezing, itching, ear ache, nasal congestion, post nasal drip,  CV:  No-   chest pain, orthopnea, PND, swelling in lower extremities, anasarca,                              dizziness, +palpitations Resp: +shortness of breath with exertion or at rest.              No-   productive cough,  non-productive cough,  No- coughing up of blood.              No-   change in color of mucus.  No- wheezing.   Skin: No-   rash or lesions. GI:  No-   heartburn, indigestion, abdominal pain, nausea, vomiting,  GU: . MS:  No-   joint pain or swelling.   Neuro-     + numbness/ peripheral neuropathy Psych:  No- change in mood or affect. + depression or anxiety.  No memory loss.  OBJ- Physical Exam    General- Alert, Oriented, Affect-appropriate, Distress- none acute, not obese Skin- rash-none, lesions- none, excoriation- none Lymphadenopathy- none Head- atraumatic            Eyes- Gross vision intact, PERRLA, conjunctivae and secretions clear            Ears- Hearing, canals-normal            Nose- Clear, no-Septal dev, mucus, polyps, erosion, perforation             Throat- Mallampati II/+ uvulopalatoplasty , mucosa clear ,  drainage- none, tonsils-  atrophic,  Neck- flexible , trachea midline, no stridor , thyroid nl, carotid no bruit Chest - symmetrical excursion , unlabored           Heart/CV- RRR , no murmur , no gallop  , no rub, nl s1 s2                           - JVD- none , edema- none, stasis changes- none, varices- none           Lung- +clear, wheeze- none, cough- none , dullness-none, rub- none           Chest wall-; + asymmetry chest wall at sternum consistent with old rib fractures and rib cage distortion. Abd-  Br/ Gen/  Rectal- Not done, not indicated Extrem- + bandage R calf Neuro- unremarkable to observation, no tremor

## 2020-10-07 ENCOUNTER — Other Ambulatory Visit: Payer: Self-pay

## 2020-10-07 ENCOUNTER — Encounter: Payer: Self-pay | Admitting: Internal Medicine

## 2020-10-07 ENCOUNTER — Ambulatory Visit: Payer: Medicare Other | Admitting: Internal Medicine

## 2020-10-07 DIAGNOSIS — G4734 Idiopathic sleep related nonobstructive alveolar hypoventilation: Secondary | ICD-10-CM

## 2020-10-07 DIAGNOSIS — G629 Polyneuropathy, unspecified: Secondary | ICD-10-CM

## 2020-10-07 NOTE — Assessment & Plan Note (Addendum)
He denies this is bothering his sleep at night- mostly felt as numbness with impaired balance.He will continue to work with Neurology.

## 2020-10-07 NOTE — Patient Instructions (Signed)
We can continue O2 2l for sleep  Please call if we can help

## 2020-10-07 NOTE — Assessment & Plan Note (Signed)
He denies any concern or problem wearing O2 for sleep. Told me he uses it most of each night but has told others he wears it maybe 1/2 night. He is not heavy enough to suspect OHS, but this is likely a CNS issue with hypoventilation during sleep. I can't tie it to his peripheral neuropathy directly. Plan- continue to sleep with O2 2L. Consider re-test in future.

## 2020-10-20 NOTE — Progress Notes (Unsigned)
Cardiology Office Note   Date:  10/21/2020   ID:  Eduardo Watts., DOB 11/16/1945, MRN [id number]  PCP:  Eduardo Cruel, MD    No chief complaint on file.  CAD  Wt Readings from Last 3 Encounters:  10/21/20 202 lb 3.2 oz (91.7 kg)  10/07/20 200 lb 6.4 oz (90.9 kg)  02/23/20 207 lb (93.9 kg)       History of Present Illness: Eduardo Watts. is a 75 y.o. male   who had a heart cath in 9/15. This showed moderate RCA disease. There was no significant obstructive disease. His ejection fraction was low normal. He had a pulmonary eval for cough. This is improved with less Coreg.   Hewas concernedin the pastabout AFib and stroke risk. He feltthat he haddaily palpitation. He hit his chest to help the palpitations resolve. Prior w/u for AFib has been negative.  He had given up alcohol since the day of the cath in 2015 but restarted alcohol in 2017.   He has an iron overload syndrome as well.   Hestartednighttime oxygen in 2017- but only uses it half the night, 2L/min. He had another sleep study in 2016 and it was normal. No need for it during the day. 94% during the day. 88% at night. Prescribed by Dr. [name].  In the past, he had decreased his alcohol intake to 2 glasses a day, down from a bottle a day.He has maintained this level. He is working with Dr. [name] to decrease his cravings for alcohol. Acampro was started for this.  He saw Dr. Harrington Challenger. Diagnosed with peripheral neuropathy. Started on Gabapentin.  In Nov 2020, it was noted he was walking 90 minutes/week despite foot numbness.   Walking limited by the neuropathy pain. He has been to several different doctors. No improvement.  He has had some side effects with some of the medicines.   Denies : Chest pain. Dizziness. Leg edema. Nitroglycerin use. Orthopnea. Palpitations. Paroxysmal nocturnal dyspnea. Syncope.   Rare DOE.     Past Medical History:  Diagnosis Date  .  Anxiety   . Asthma    as a child  . Depression    sees Dr [name]  . History of BPH    Dr.Dahlstedt  . Hyperlipidemia   . Prediabetes   . Sleep disturbances     Past Surgical History:  Procedure Laterality Date  . LEFT HEART CATHETERIZATION WITH CORONARY ANGIOGRAM N/A 04/30/2014   Procedure: LEFT HEART CATHETERIZATION WITH CORONARY ANGIOGRAM;  Surgeon: [name], MD;  Location: [facility] CATH LAB;  Service: Cardiovascular;  Laterality: N/A;  . NASAL SEPTUM SURGERY  1975  . R cheek benign tumor      1992, again 1998.  . TONSILLECTOMY       Current Outpatient Medications  Medication Sig Dispense Refill  . alfuzosin (UROXATRAL) 10 MG 24 hr tablet Take 10 mg by mouth daily.    . Alpha-Lipoic Acid 600 MG CAPS Take 1 capsule (600 mg total) by mouth daily. 30 capsule 0  . B Complex Vitamins (B COMPLEX PO) Take 1 tablet by mouth daily.     Marland Kitchen CALCIUM PO Take 1 tablet by mouth daily.     . carvedilol (COREG) 3.125 MG tablet     . Cholecalciferol (VITAMIN D3) 5000 units CAPS Take 1 capsule by mouth daily.    . cyclobenzaprine (FLEXERIL) 10 MG tablet TAKE 1 TABLET BY MOUTH AT BEDTIME 30 tablet 6  . ezetimibe (ZETIA)  10 MG tablet Take 1 tablet (10 mg total) by mouth daily. 30 tablet 11  . finasteride (PROSCAR) 5 MG tablet Take 1 tablet by mouth every other night    . fluticasone (FLONASE) 50 MCG/ACT nasal spray Place 1 spray into both nostrils daily.     Marland Kitchen gabapentin (NEURONTIN) 300 MG capsule Take 300 mg by mouth daily.    . hydrOXYzine (ATARAX/VISTARIL) 10 MG tablet TAKE 2 TABLETS BY MOUTH THREE TIMES DAILY AS NEEDED FOR ANXIETY 180 tablet 1  . Krill Oil 1000 MG CAPS Take by mouth.    Marland Kitchen LORazepam (ATIVAN) 0.5 MG tablet Take 2 tablets (1 mg total) by mouth at bedtime. 60 tablet 5  . Magnesium 100 MG CAPS Take by mouth.    . metFORMIN (GLUCOPHAGE) 500 MG tablet Take 500 mg by mouth 2 (two) times daily with a meal.    . Omega-3 Fatty Acids (FISH OIL) 1000 MG CAPS Take 1 capsule by mouth  daily.    . rosuvastatin (CRESTOR) 5 MG tablet Take 5 mg by mouth every other day.    . zinc sulfate 220 (50 Zn) MG capsule Take 220 mg by mouth daily.     No current facility-administered medications for this visit.    Allergies:   Zocor [simvastatin] and Trazodone and nefazodone    Social History:  The patient  reports that he has never smoked. He has never used smokeless tobacco. He reports current alcohol use of about 14.0 - 21.0 standard drinks of alcohol per week. He reports that he does not use drugs.   Family History:  The patient's family history includes Cancer - Prostate in his father; Dementia in his mother; Depression in his brother; Hyperlipidemia in his sister; Lung disease in his maternal grandfather; Melanoma in his father; Stroke in his mother; Suicidality in his brother.    ROS:  Please see the history of present illness.   Otherwise, review of systems are positive for foot pain.   All other systems are reviewed and negative.    PHYSICAL EXAM: VS:  BP 96/60   Pulse 100   Ht 6\' 2"  (1.88 m)   Wt 202 lb 3.2 oz (91.7 kg)   SpO2 90%   BMI 25.96 kg/m  , BMI Body mass index is 25.96 kg/m. GEN: Well nourished, well developed, in no acute distress  HEENT: normal  Neck: no JVD, carotid bruits, or masses Cardiac: RRR; no murmurs, rubs, or gallops,no edema  Respiratory:  clear to auscultation bilaterally, normal work of breathing GI: soft, nontender, nondistended, + BS MS: no deformity or atrophy; scab on right shin  Skin: warm and dry, no rash Neuro:  Strength and sensation are intact Psych: euthymic mood, full affect   EKG:   The ekg ordered in 7/21 NSR demonstrates NSR, IRBBB   Recent Labs: No results found for requested labs within last 8760 hours.   Lipid Panel    Component Value Date/Time   CHOL 224 (H) 05/24/2017 0845   TRIG 141 05/24/2017 0845   HDL 57 05/24/2017 0845   CHOLHDL 3.9 05/24/2017 0845   LDLCALC 139 (H) 05/24/2017 0845     Other  studies Reviewed: Additional studies/ records that were reviewed today with results demonstrating: labs reviewed .   ASSESSMENT AND PLAN:  1. CAD: Moderate in the past. No angina.  Continue aggressive secondary prevention.    2. DM: A1C 6.8.  Eating fiber regularly.  Decreasing candy.  Well controlled.  3. Alcohol use: Reports  2 glasses of wine a day.  4. Hyperlipidemia: LDL 68.  Continue Crestor.  Helathy diet recommended. 5. Abnormal ECG: RBBB pattern stable.  6. Neuropathy: THis is his most limiting issue.    Current medicines are reviewed at length with the patient today.  The patient concerns regarding his medicines were addressed.  The following changes have been made:  No change  Labs/ tests ordered today include:  No orders of the defined types were placed in this encounter.   Recommend 150 minutes/week of aerobic exercise Low fat, low carb, high fiber diet recommended  Disposition:   FU in 9 months   Signed, Larae Grooms, MD  10/21/2020 8:28 AM    [facility] [address], [place], [place]  [address] Phone: [phone]; Fax: [phone]

## 2020-10-21 ENCOUNTER — Other Ambulatory Visit: Payer: Self-pay

## 2020-10-21 ENCOUNTER — Encounter: Payer: Self-pay | Admitting: Interventional Cardiology

## 2020-10-21 ENCOUNTER — Ambulatory Visit: Payer: Medicare Other | Admitting: Interventional Cardiology

## 2020-10-21 VITALS — BP 96/60 | HR 100 | Ht 74.0 in | Wt 202.2 lb

## 2020-10-21 DIAGNOSIS — E1159 Type 2 diabetes mellitus with other circulatory complications: Secondary | ICD-10-CM

## 2020-10-21 DIAGNOSIS — E782 Mixed hyperlipidemia: Secondary | ICD-10-CM | POA: Diagnosis not present

## 2020-10-21 DIAGNOSIS — I25119 Atherosclerotic heart disease of native coronary artery with unspecified angina pectoris: Secondary | ICD-10-CM

## 2020-10-21 DIAGNOSIS — I4510 Unspecified right bundle-branch block: Secondary | ICD-10-CM

## 2020-10-21 NOTE — Patient Instructions (Signed)
Medication Instructions:  Your physician recommends that you continue on your current medications as directed. Please refer to the Current Medication list given to you today.  *If you need a refill on your cardiac medications before your next appointment, please call your pharmacy*   Lab Work: none If you have labs (blood work) drawn today and your tests are completely normal, you will receive your results only by: Marland Kitchen MyChart Message (if you have MyChart) OR . A paper copy in the mail If you have any lab test that is abnormal or we need to change your treatment, we will call you to review the results.   Testing/Procedures: none   Follow-Up: At [facility], you and your health needs are our priority.  As part of our continuing mission to provide you with exceptional heart care, we have created designated Provider Care Teams.  These Care Teams include your primary Cardiologist (physician) and Advanced Practice Providers (APPs -  Physician Assistants and Nurse Practitioners) who all work together to provide you with the care you need, when you need it.  We recommend signing up for the patient portal called "MyChart".  Sign up information is provided on this After Visit Summary.  MyChart is used to connect with patients for Virtual Visits (Telemedicine).  Patients are able to view lab/test results, encounter notes, upcoming appointments, etc.  Non-urgent messages can be sent to your provider as well.   To learn more about what you can do with MyChart, go to [email or web address].    Your next appointment:   May 05, 2021 at 8:00  The format for your next appointment:   In Person  Provider:   Casandra Doffing, MD   Other Instructions  High-Fiber Eating Plan Fiber, also called dietary fiber, is a type of carbohydrate. It is found foods such as fruits, vegetables, whole grains, and beans. A high-fiber diet can have many health benefits. Your health care provider may recommend a  high-fiber diet to help:  Prevent constipation. Fiber can make your bowel movements more regular.  Lower your cholesterol.  Relieve the following conditions: ? Inflammation of veins in the anus (hemorrhoids). ? Inflammation of specific areas of the digestive tract (uncomplicated diverticulosis). ? A problem of the large intestine, also called the colon, that sometimes causes pain and diarrhea (irritable bowel syndrome, or IBS).  Prevent overeating as part of a weight-loss plan.  Prevent heart disease, type 2 diabetes, and certain cancers. What are tips for following this plan? Reading food labels  Check the nutrition facts label on food products for the amount of dietary fiber. Choose foods that have 5 grams of fiber or more per serving.  The goals for recommended daily fiber intake include: ? Men (age 46 or younger): 34-38 g. ? Men (over age 51): 28-34 g. ? Women (age 71 or younger): 25-28 g. ? Women (over age 52): 22-25 g. Your daily fiber goal is _____________ g.   Shopping  Choose whole fruits and vegetables instead of processed forms, such as apple juice or applesauce.  Choose a wide variety of high-fiber foods such as avocados, lentils, oats, and kidney beans.  Read the nutrition facts label of the foods you choose. Be aware of foods with added fiber. These foods often have high sugar and sodium amounts per serving. Cooking  Use whole-grain flour for baking and cooking.  Cook with brown rice instead of white rice. Meal planning  Start the day with a breakfast that is high in fiber, such as  a cereal that contains 5 g of fiber or more per serving.  Eat breads and cereals that are made with whole-grain flour instead of refined flour or white flour.  Eat brown rice, bulgur wheat, or millet instead of white rice.  Use beans in place of meat in soups, salads, and pasta dishes.  Be sure that half of the grains you eat each day are whole grains. General  information  You can get the recommended daily intake of dietary fiber by: ? Eating a variety of fruits, vegetables, grains, nuts, and beans. ? Taking a fiber supplement if you are not able to take in enough fiber in your diet. It is better to get fiber through food than from a supplement.  Gradually increase how much fiber you consume. If you increase your intake of dietary fiber too quickly, you may have bloating, cramping, or gas.  Drink plenty of water to help you digest fiber.  Choose high-fiber snacks, such as berries, raw vegetables, nuts, and popcorn. What foods should I eat? Fruits Berries. Pears. Apples. Oranges. Avocado. Prunes and raisins. Dried figs. Vegetables Sweet potatoes. Spinach. Kale. Artichokes. Cabbage. Broccoli. Cauliflower. Green peas. Carrots. Squash. Grains Whole-grain breads. Multigrain cereal. Oats and oatmeal. Brown rice. Barley. Bulgur wheat. [place]. Quinoa. Bran muffins. Popcorn. Rye wafer crackers. Meats and other proteins Navy beans, kidney beans, and pinto beans. Soybeans. Split peas. Lentils. Nuts and seeds. Dairy Fiber-fortified yogurt. Beverages Fiber-fortified soy milk. Fiber-fortified orange juice. Other foods Fiber bars. The items listed above may not be a complete list of recommended foods and beverages. Contact a dietitian for more information. What foods should I avoid? Fruits Fruit juice. Cooked, strained fruit. Vegetables Fried potatoes. Canned vegetables. Well-cooked vegetables. Grains White bread. Pasta made with refined flour. White rice. Meats and other proteins Fatty cuts of meat. Fried chicken or fried fish. Dairy Milk. Yogurt. Cream cheese. Sour cream. Fats and oils Butters. Beverages Soft drinks. Other foods Cakes and pastries. The items listed above may not be a complete list of foods and beverages to avoid. Talk with your dietitian about what choices are best for you. Summary  Fiber is a type of carbohydrate. It is  found in foods such as fruits, vegetables, whole grains, and beans.  A high-fiber diet has many benefits. It can help to prevent constipation, lower blood cholesterol, aid weight loss, and reduce your risk of heart disease, diabetes, and certain cancers.  Increase your intake of fiber gradually. Increasing fiber too quickly may cause cramping, bloating, and gas. Drink plenty of water while you increase the amount of fiber you consume.  The best sources of fiber include whole fruits and vegetables, whole grains, nuts, seeds, and beans. This information is not intended to replace advice given to you by your health care provider. Make sure you discuss any questions you have with your health care provider. Document Revised: 11/27/2019 Document Reviewed: 11/27/2019 Elsevier Patient Education  2021 [employer].

## 2020-10-29 ENCOUNTER — Emergency Department (HOSPITAL_COMMUNITY): Payer: Medicare Other

## 2020-10-29 ENCOUNTER — Observation Stay (HOSPITAL_COMMUNITY)
Admission: EM | Admit: 2020-10-29 | Discharge: 2020-10-31 | Disposition: A | Discharge status: Home / Self Care | Payer: Medicare Other | Attending: Internal Medicine | Admitting: Internal Medicine

## 2020-10-29 DIAGNOSIS — R0602 Shortness of breath: Secondary | ICD-10-CM

## 2020-10-29 DIAGNOSIS — I672 Cerebral atherosclerosis: Secondary | ICD-10-CM | POA: Insufficient documentation

## 2020-10-29 DIAGNOSIS — J45909 Unspecified asthma, uncomplicated: Secondary | ICD-10-CM | POA: Insufficient documentation

## 2020-10-29 DIAGNOSIS — I10 Essential (primary) hypertension: Secondary | ICD-10-CM | POA: Diagnosis not present

## 2020-10-29 DIAGNOSIS — Z79899 Other long term (current) drug therapy: Secondary | ICD-10-CM | POA: Insufficient documentation

## 2020-10-29 DIAGNOSIS — F10220 Alcohol dependence with intoxication, uncomplicated: Secondary | ICD-10-CM | POA: Insufficient documentation

## 2020-10-29 DIAGNOSIS — S51011A Laceration without foreign body of right elbow, initial encounter: Secondary | ICD-10-CM | POA: Diagnosis not present

## 2020-10-29 DIAGNOSIS — S2241XA Multiple fractures of ribs, right side, initial encounter for closed fracture: Principal | ICD-10-CM | POA: Diagnosis present

## 2020-10-29 DIAGNOSIS — Y900 Blood alcohol level of less than 20 mg/100 ml: Secondary | ICD-10-CM | POA: Diagnosis not present

## 2020-10-29 DIAGNOSIS — Y92009 Unspecified place in unspecified non-institutional (private) residence as the place of occurrence of the external cause: Secondary | ICD-10-CM | POA: Insufficient documentation

## 2020-10-29 DIAGNOSIS — Z23 Encounter for immunization: Secondary | ICD-10-CM | POA: Insufficient documentation

## 2020-10-29 DIAGNOSIS — S299XXA Unspecified injury of thorax, initial encounter: Secondary | ICD-10-CM | POA: Diagnosis present

## 2020-10-29 DIAGNOSIS — F1090 Alcohol use, unspecified, uncomplicated: Secondary | ICD-10-CM

## 2020-10-29 DIAGNOSIS — Z20822 Contact with and (suspected) exposure to covid-19: Secondary | ICD-10-CM | POA: Diagnosis not present

## 2020-10-29 DIAGNOSIS — W1839XA Other fall on same level, initial encounter: Secondary | ICD-10-CM | POA: Diagnosis not present

## 2020-10-29 DIAGNOSIS — I2510 Atherosclerotic heart disease of native coronary artery without angina pectoris: Secondary | ICD-10-CM | POA: Diagnosis not present

## 2020-10-29 DIAGNOSIS — Z7984 Long term (current) use of oral hypoglycemic drugs: Secondary | ICD-10-CM | POA: Diagnosis not present

## 2020-10-29 DIAGNOSIS — R0789 Other chest pain: Secondary | ICD-10-CM | POA: Diagnosis not present

## 2020-10-29 DIAGNOSIS — R7303 Prediabetes: Secondary | ICD-10-CM | POA: Diagnosis present

## 2020-10-29 DIAGNOSIS — Z7289 Other problems related to lifestyle: Secondary | ICD-10-CM | POA: Diagnosis not present

## 2020-10-29 DIAGNOSIS — E782 Mixed hyperlipidemia: Secondary | ICD-10-CM | POA: Diagnosis not present

## 2020-10-29 DIAGNOSIS — G4734 Idiopathic sleep related nonobstructive alveolar hypoventilation: Secondary | ICD-10-CM | POA: Diagnosis present

## 2020-10-29 DIAGNOSIS — G629 Polyneuropathy, unspecified: Secondary | ICD-10-CM

## 2020-10-29 DIAGNOSIS — Z789 Other specified health status: Secondary | ICD-10-CM

## 2020-10-29 DIAGNOSIS — F10920 Alcohol use, unspecified with intoxication, uncomplicated: Secondary | ICD-10-CM

## 2020-10-29 LAB — COMPREHENSIVE METABOLIC PANEL
ALT: 38 U/L (ref 0–44)
AST: 46 U/L — ABNORMAL HIGH (ref 15–41)
Albumin: 3.9 g/dL (ref 3.5–5.0)
Alkaline Phosphatase: 93 U/L (ref 38–126)
Anion gap: 12 (ref 5–15)
BUN: 16 mg/dL (ref 8–23)
CO2: 22 mmol/L (ref 22–32)
Calcium: 9 mg/dL (ref 8.9–10.3)
Chloride: 100 mmol/L (ref 98–111)
Creatinine, Ser: 1.07 mg/dL (ref 0.61–1.24)
GFR, Estimated: >60 mL/min (ref >60)
Glucose, Bld: 149 mg/dL — ABNORMAL HIGH (ref 70–99)
Potassium: 4.5 mmol/L (ref 3.5–5.1)
Sodium: 134 mmol/L — ABNORMAL LOW (ref 135–145)
Total Bilirubin: 0.9 mg/dL (ref 0.3–1.2)
Total Protein: 7.1 g/dL (ref 6.5–8.1)

## 2020-10-29 LAB — PHOSPHORUS: Phosphorus: 3 mg/dL (ref 2.5–4.6)

## 2020-10-29 LAB — CBC WITH DIFFERENTIAL/PLATELET
Abs Immature Granulocytes: 0.02 10*3/uL (ref 0.00–0.07)
Basophils Absolute: 0.1 10*3/uL (ref 0.0–0.1)
Basophils Relative: 1 %
Eosinophils Absolute: 0.2 10*3/uL (ref 0.0–0.5)
Eosinophils Relative: 2 %
HCT: 40.6 % (ref 39.0–52.0)
Hemoglobin: 13.5 g/dL (ref 13.0–17.0)
Immature Granulocytes: 0 %
Lymphocytes Relative: 13 %
Lymphs Abs: 0.9 10*3/uL (ref 0.7–4.0)
MCH: 31.8 pg (ref 26.0–34.0)
MCHC: 33.3 g/dL (ref 30.0–36.0)
MCV: 95.8 fL (ref 80.0–100.0)
Monocytes Absolute: 0.6 10*3/uL (ref 0.1–1.0)
Monocytes Relative: 10 %
Neutro Abs: 4.8 10*3/uL (ref 1.7–7.7)
Neutrophils Relative %: 74 %
Platelets: 215 10*3/uL (ref 150–400)
RBC: 4.24 MIL/uL (ref 4.22–5.81)
RDW: 12.4 % (ref 11.5–15.5)
WBC: 6.5 10*3/uL (ref 4.0–10.5)
nRBC: 0 % (ref 0.0–0.2)

## 2020-10-29 LAB — MAGNESIUM: Magnesium: 2.3 mg/dL (ref 1.7–2.4)

## 2020-10-29 LAB — ETHANOL: Alcohol, Ethyl (B): 214 mg/dL — ABNORMAL HIGH (ref <10)

## 2020-10-29 MED ORDER — HYDROCODONE-ACETAMINOPHEN 5-325 MG PO TABS
1.0000 | ORAL_TABLET | ORAL | Status: DC | PRN
Start: 2020-10-29 — End: 2020-10-31
  Administered 2020-10-29 – 2020-10-31 (×9): 1 via ORAL
  Filled 2020-10-29 (×9): qty 1

## 2020-10-29 MED ORDER — LORAZEPAM 2 MG/ML IJ SOLN: 0.0000 mg | Freq: Four times a day (QID) | INTRAMUSCULAR | Status: DC

## 2020-10-29 MED ORDER — EZETIMIBE 10 MG PO TABS
10.0000 mg | ORAL_TABLET | Freq: Every day | ORAL | Status: DC
  Administered 2020-10-29 – 2020-10-30 (×2): 10 mg via ORAL
  Filled 2020-10-29 (×2): qty 1

## 2020-10-29 MED ORDER — THIAMINE HCL 100 MG PO TABS
100.0000 mg | ORAL_TABLET | Freq: Every day | ORAL | Status: DC
  Administered 2020-10-29 – 2020-10-31 (×2): 100 mg via ORAL
  Filled 2020-10-29 (×3): qty 1

## 2020-10-29 MED ORDER — HYDROXYZINE HCL 10 MG PO TABS
10.0000 mg | ORAL_TABLET | Freq: Three times a day (TID) | ORAL | Status: DC | PRN
  Administered 2020-10-30: 10 mg via ORAL
  Filled 2020-10-29 (×2): qty 1

## 2020-10-29 MED ORDER — METFORMIN HCL 500 MG PO TABS
500.0000 mg | ORAL_TABLET | Freq: Two times a day (BID) | ORAL | Status: DC
  Filled 2020-10-29 (×2): qty 1

## 2020-10-29 MED ORDER — LORAZEPAM 1 MG PO TABS: 0.0000 mg | ORAL_TABLET | Freq: Four times a day (QID) | ORAL | Status: DC

## 2020-10-29 MED ORDER — HYDROMORPHONE HCL 1 MG/ML IJ SOLN: 0.5000 mg | INTRAMUSCULAR | Status: DC | PRN

## 2020-10-29 MED ORDER — SODIUM CHLORIDE 0.9% FLUSH
3.0000 mL | Freq: Two times a day (BID) | INTRAVENOUS | Status: DC
  Administered 2020-10-29 – 2020-10-30 (×3): 3 mL via INTRAVENOUS

## 2020-10-29 MED ORDER — LORAZEPAM 2 MG/ML IJ SOLN: 0.0000 mg | Freq: Two times a day (BID) | INTRAMUSCULAR | Status: DC

## 2020-10-29 MED ORDER — LORAZEPAM 1 MG PO TABS
1.0000 mg | ORAL_TABLET | Freq: Every day | ORAL | Status: DC
  Administered 2020-10-29 – 2020-10-30 (×2): 1 mg via ORAL
  Filled 2020-10-29 (×2): qty 1

## 2020-10-29 MED ORDER — ACETAMINOPHEN 325 MG PO TABS
650.0000 mg | ORAL_TABLET | Freq: Four times a day (QID) | ORAL | Status: DC | PRN
  Administered 2020-10-31: 650 mg via ORAL
  Filled 2020-10-29: qty 2

## 2020-10-29 MED ORDER — ROSUVASTATIN CALCIUM 5 MG PO TABS
5.0000 mg | ORAL_TABLET | ORAL | Status: DC
Start: 2020-10-30 — End: 2020-10-31
  Administered 2020-10-30: 5 mg via ORAL
  Filled 2020-10-29: qty 1

## 2020-10-29 MED ORDER — CARVEDILOL 3.125 MG PO TABS
3.1250 mg | ORAL_TABLET | Freq: Two times a day (BID) | ORAL | Status: DC
  Administered 2020-10-30 – 2020-10-31 (×3): 3.125 mg via ORAL
  Filled 2020-10-29 (×3): qty 1

## 2020-10-29 MED ORDER — FINASTERIDE 5 MG PO TABS
5.0000 mg | ORAL_TABLET | ORAL | Status: DC
  Administered 2020-10-30: 5 mg via ORAL
  Filled 2020-10-29: qty 1

## 2020-10-29 MED ORDER — ADULT MULTIVITAMIN W/MINERALS CH
1.0000 | ORAL_TABLET | Freq: Every day | ORAL | Status: DC
  Administered 2020-10-30 – 2020-10-31 (×2): 1 via ORAL
  Filled 2020-10-29 (×2): qty 1

## 2020-10-29 MED ORDER — KETOROLAC TROMETHAMINE 30 MG/ML IJ SOLN
30.0000 mg | Freq: Once | INTRAMUSCULAR | Status: AC
  Administered 2020-10-29: 30 mg via INTRAMUSCULAR
  Filled 2020-10-29: qty 1

## 2020-10-29 MED ORDER — FOLIC ACID 1 MG PO TABS
1.0000 mg | ORAL_TABLET | Freq: Every day | ORAL | Status: DC
  Administered 2020-10-30 – 2020-10-31 (×2): 1 mg via ORAL
  Filled 2020-10-29 (×2): qty 1

## 2020-10-29 MED ORDER — LORAZEPAM 1 MG PO TABS: 0.0000 mg | ORAL_TABLET | Freq: Two times a day (BID) | ORAL | Status: DC

## 2020-10-29 MED ORDER — TETANUS-DIPHTH-ACELL PERTUSSIS 5-2.5-18.5 LF-MCG/0.5 IM SUSY
0.5000 mL | PREFILLED_SYRINGE | Freq: Once | INTRAMUSCULAR | Status: AC
  Administered 2020-10-29: 0.5 mL via INTRAMUSCULAR
  Filled 2020-10-29: qty 0.5

## 2020-10-29 MED ORDER — LIP MEDEX EX OINT
TOPICAL_OINTMENT | CUTANEOUS | Status: AC
  Filled 2020-10-29: qty 7

## 2020-10-29 MED ORDER — POLYETHYLENE GLYCOL 3350 17 G PO PACK: 17.0000 g | PACK | Freq: Every day | ORAL | Status: DC | PRN

## 2020-10-29 MED ORDER — ALFUZOSIN HCL ER 10 MG PO TB24
10.0000 mg | ORAL_TABLET | Freq: Every day | ORAL | Status: DC
  Administered 2020-10-29 – 2020-10-31 (×3): 10 mg via ORAL
  Filled 2020-10-29 (×3): qty 1

## 2020-10-29 MED ORDER — THIAMINE HCL 100 MG/ML IJ SOLN: 100.0000 mg | Freq: Every day | INTRAMUSCULAR | Status: DC

## 2020-10-29 MED ORDER — ACETAMINOPHEN 650 MG RE SUPP: 650.0000 mg | Freq: Four times a day (QID) | RECTAL | Status: DC | PRN

## 2020-10-29 MED ORDER — GABAPENTIN 300 MG PO CAPS
300.0000 mg | ORAL_CAPSULE | Freq: Every day | ORAL | Status: DC
  Administered 2020-10-30 – 2020-10-31 (×2): 300 mg via ORAL
  Filled 2020-10-29 (×2): qty 1

## 2020-10-29 NOTE — ED Notes (Addendum)
Wife at bedside.

## 2020-10-29 NOTE — ED Triage Notes (Addendum)
Ems brings pt in from home. Wife reports pt has been drinking wine all day and fell a couple of times. Abrasion to his right elbow. Pt complains of right rib pain. No blood thinners.

## 2020-10-29 NOTE — ED Provider Notes (Signed)
[facility] DEPT Provider Note   CSN: [id number] Arrival date & time: 10/29/20  1456     History Chief Complaint  Patient presents with  . Fall    Eduardo Watts. is a 76 y.o. male.  HPI     This is a 75 year old male with a history of hyperlipidemia, prediabetes, alcohol abuse who presents after recurrent falls.  Patient reports "I drank too much wine today."  He states he drinks wine daily.  He drank approximately 2 bottles of wine and has had recurrent falls at home.  His wife called EMS.  He is unsure whether he hit his head or lost consciousness.  He is able to provide history.  Reports pain to the right elbow and right rib cage.  No shortness of breath.  He is not on any anticoagulants.  He does wear oxygen at night.  Of note, initial O2 sat 89%.  He was placed on 2 L of oxygen.  Past Medical History:  Diagnosis Date  . Anxiety   . Asthma    as a child  . Depression    sees Dr [name]  . History of BPH    Dr.Dahlstedt  . Hyperlipidemia   . Prediabetes   . Sleep disturbances     Patient Active Problem List   Diagnosis Date Noted  . Peripheral polyneuropathy 04/15/2019  . GERD (gastroesophageal reflux disease) 10/08/2018  . Prediabetes 11/08/2016  . Alcohol use 07/13/2015  . Dyspnea on exertion 04/02/2015  . Insomnia 04/02/2015  . Palpitations 01/27/2015  . Nocturnal hypoxia 01/27/2015  . Right-sided chest wall pain 11/26/2014  . Hypotension 09/08/2014  . Cough 06/09/2014  . Coronary atherosclerosis of native coronary artery 05/06/2014  . Nonspecific abnormal unspecified cardiovascular function study 04/21/2014  . Mixed hyperlipidemia 07/28/2013  . Nonspecific abnormal electrocardiogram (ECG) (EKG) 07/28/2013    Past Surgical History:  Procedure Laterality Date  . LEFT HEART CATHETERIZATION WITH CORONARY ANGIOGRAM N/A 04/30/2014   Procedure: LEFT HEART CATHETERIZATION WITH CORONARY ANGIOGRAM;  Surgeon: [name], MD;  Location: [facility] C[facility] LAB;  Service: Cardiovascular;  Laterality: N/A;  . NASAL SEPTUM SURGERY  1975  . R cheek benign tumor      1992, again 1998.  . TONSILLECTOMY         Family History  Problem Relation Age of Onset  . Dementia Mother   . Stroke Mother   . Cancer - Prostate Father   . Melanoma Father   . Hyperlipidemia Sister        high cholestrol  . Depression Brother   . Suicidality Brother   . Lung disease Maternal Grandfather   . Heart attack Neg Hx   . Hypertension Neg Hx     Social History   Tobacco Use  . Smoking status: Never Smoker  . Smokeless tobacco: Never Used  Vaping Use  . Vaping Use: Never used  Substance Use Topics  . Alcohol use: Yes    Alcohol/week: 14.0 - 21.0 standard drinks    Types: 14 - 21 Glasses of wine per week  . Drug use: No    Home Medications Prior to Admission medications   Medication Sig Start Date End Date Taking? Authorizing Provider  alfuzosin (UROXATRAL) 10 MG 24 hr tablet Take 10 mg by mouth daily.    [provider]  Alpha-Lipoic Acid 600 MG CAPS Take 1 capsule (600 mg total) by mouth daily. 04/15/19   Melvenia Beam, MD  B Complex  Vitamins (B COMPLEX PO) Take 1 tablet by mouth daily.     [provider]  CALCIUM PO Take 1 tablet by mouth daily.     [provider]  carvedilol (COREG) 3.125 MG tablet  06/18/19   [provider]  Cholecalciferol (VITAMIN D3) 5000 units CAPS Take 1 capsule by mouth daily.    [provider]  cyclobenzaprine (FLEXERIL) 10 MG tablet TAKE 1 TABLET BY MOUTH AT BEDTIME 10/01/20   Cottle, Billey Co., MD  ezetimibe (ZETIA) 10 MG tablet Take 1 tablet (10 mg total) by mouth daily. 06/06/17   [name], MD  finasteride (PROSCAR) 5 MG tablet Take 1 tablet by mouth every other night    [provider]  fluticasone (FLONASE) 50 MCG/ACT nasal spray Place 1 spray into both nostrils daily.     [provider]  gabapentin  (NEURONTIN) 300 MG capsule Take 300 mg by mouth daily. 11/18/18   [provider]  hydrOXYzine (ATARAX/VISTARIL) 10 MG tablet TAKE 2 TABLETS BY MOUTH THREE TIMES DAILY AS NEEDED FOR ANXIETY 08/26/20   Cottle, Billey Co., MD  Krill Oil 1000 MG CAPS Take by mouth.    [provider]  LORazepam (ATIVAN) 0.5 MG tablet Take 2 tablets (1 mg total) by mouth at bedtime. 08/26/20   Cottle, Billey Co., MD  Magnesium 100 MG CAPS Take by mouth.    [provider]  metFORMIN (GLUCOPHAGE) 500 MG tablet Take 500 mg by mouth 2 (two) times daily with a meal.    [provider]  Omega-3 Fatty Acids (FISH OIL) 1000 MG CAPS Take 1 capsule by mouth daily.    [provider]  rosuvastatin (CRESTOR) 5 MG tablet Take 5 mg by mouth every other day. 09/24/17   [provider]  zinc sulfate 220 (50 Zn) MG capsule Take 220 mg by mouth daily.    [provider]    Allergies    Zocor [simvastatin] and Trazodone and nefazodone  Review of Systems   Review of Systems  Constitutional: Negative for fever.  Respiratory: Negative for shortness of breath.   Cardiovascular: Positive for chest pain. Negative for leg swelling.  Gastrointestinal: Negative for abdominal pain, nausea and vomiting.  Genitourinary: Negative for dysuria.  Musculoskeletal:       Right elbow pain  Skin: Positive for wound.  Neurological: Negative for weakness.  All other systems reviewed and are negative.   Physical Exam Updated Vital Signs BP 125/83 (BP Location: Left Arm)   Pulse 80   Temp 97.8 F (36.6 C) (Oral)   Resp 17   Ht 1.88 m (6\' 2" )   Wt 91.6 kg   SpO2 95%   BMI 25.94 kg/m   Physical Exam Vitals and nursing note reviewed.  Constitutional:      Appearance: He is well-developed. He is not ill-appearing.     Comments: ABCs intact  HENT:     Head: Normocephalic and atraumatic.     Nose: Nose normal.     Mouth/Throat:     Mouth: Mucous membranes are moist.  Eyes:      Pupils: Pupils are equal, round, and reactive to light.     Comments: Pupils 4 mm reactive bilaterally  Cardiovascular:     Rate and Rhythm: Normal rate and regular rhythm.     Heart sounds: Normal heart sounds. No murmur heard.   Pulmonary:     Effort: Pulmonary effort is normal. No respiratory distress.  Breath sounds: Normal breath sounds. No wheezing.     Comments: No overlying skin changes or crepitus Chest:     Chest wall: Tenderness present.  Abdominal:     General: Bowel sounds are normal.     Palpations: Abdomen is soft.     Tenderness: There is no abdominal tenderness. There is no rebound.  Musculoskeletal:     Cervical back: Neck supple.     Comments: Skin tear right elbow, normal range of motion, no obvious deformity Normal range of motion bilateral hips and knees  Lymphadenopathy:     Cervical: No cervical adenopathy.  Skin:    General: Skin is warm and dry.  Neurological:     Mental Status: He is alert and oriented to person, place, and time.  Psychiatric:     Comments: Appears intoxicated     ED Results / Procedures / Treatments   Labs (all labs ordered are listed, but only abnormal results are displayed) Labs Reviewed  ETHANOL - Abnormal; Notable for the following components:      Result Value   Alcohol, Ethyl (B) 214 (*)    All other components within normal limits  COMPREHENSIVE METABOLIC PANEL - Abnormal; Notable for the following components:   Sodium 134 (*)    Glucose, Bld 149 (*)    AST 46 (*)    All other components within normal limits  SARS CORONAVIRUS 2 (TAT 6-24 HRS)  CBC WITH DIFFERENTIAL/PLATELET    EKG None  Radiology DG Ribs Unilateral W/Chest Right  Result Date: 10/29/2020 CLINICAL DATA:  Intoxicated, fell, pain EXAM: RIGHT RIBS AND CHEST - 3+ VIEW COMPARISON:  10/08/2019 FINDINGS: Frontal view of the chest as well as frontal and oblique views of the right thoracic cage are obtained. The cardiac silhouette is unremarkable.  No airspace disease, effusion, or pneumothorax. There are minimally displaced right posterolateral seventh through ninth rib fractures. No other acute bony abnormalities. IMPRESSION: 1. Minimally displaced right posterolateral seventh through ninth rib fractures. 2. No effusion or pneumothorax. Electronically Signed   By: Randa Ngo M.D.   On: 10/29/2020 16:15   DG Elbow Complete Right  Result Date: 10/29/2020 CLINICAL DATA:  Intoxicated, fell, right elbow pain EXAM: RIGHT ELBOW - COMPLETE 3+ VIEW COMPARISON:  None. FINDINGS: Frontal, bilateral oblique, lateral views of the right elbow are obtained. No fracture, subluxation, or dislocation. Joint spaces are well preserved. Soft tissues are normal. No joint effusion. IMPRESSION: 1. Unremarkable right elbow. Electronically Signed   By: Randa Ngo M.D.   On: 10/29/2020 16:13   CT Head Wo Contrast  Result Date: 10/29/2020 CLINICAL DATA:  Fall. EXAM: CT HEAD WITHOUT CONTRAST TECHNIQUE: Contiguous axial images were obtained from the base of the skull through the vertex without intravenous contrast. COMPARISON:  12/01/2017 FINDINGS: Brain: Generalized atrophy. No sign of old or acute focal infarction, mass lesion, hemorrhage, hydrocephalus or extra-axial collection. Vascular: There is atherosclerotic calcification of the major vessels at the base of the brain. Skull: Normal Sinuses/Orbits: Clear/normal Other: None IMPRESSION: No acute or traumatic finding. Generalized atrophy. Atherosclerotic calcification of the major vessels at the base of the brain. Electronically Signed   By: Nelson Chimes M.D.   On: 10/29/2020 16:41   CT Cervical Spine Wo Contrast  Result Date: 10/29/2020 CLINICAL DATA:  Fall today with trauma to the head and neck. EXAM: CT CERVICAL SPINE WITHOUT CONTRAST TECHNIQUE: Multidetector CT imaging of the cervical spine was performed without intravenous contrast. Multiplanar CT image reconstructions were also generated. COMPARISON:  12/01/2017 FINDINGS: Alignment: Normal Skull base and vertebrae: No fracture or primary bone lesion. Soft tissues and spinal canal: Negative Disc levels: Degenerative spondylosis at C5-6 with right foraminal encroachment by osteophytes. Facet osteoarthritis on the right at C3-4 and C7-T1. Upper chest: Negative Other: None IMPRESSION: No acute or traumatic finding. Degenerative spondylosis at C5-6 with right foraminal encroachment by osteophytes. Facet osteoarthritis on the right at C3-4 and C7-T1. Electronically Signed   By: Nelson Chimes M.D.   On: 10/29/2020 16:43    Procedures Procedures   Medications Ordered in ED Medications  Tdap (BOOSTRIX) injection 0.5 mL (has no administration in time range)  ketorolac (TORADOL) 30 MG/ML injection 30 mg (has no administration in time range)  LORazepam (ATIVAN) injection 0-4 mg (has no administration in time range)    Or  LORazepam (ATIVAN) tablet 0-4 mg (has no administration in time range)  LORazepam (ATIVAN) injection 0-4 mg (has no administration in time range)    Or  LORazepam (ATIVAN) tablet 0-4 mg (has no administration in time range)  thiamine tablet 100 mg (has no administration in time range)    Or  thiamine (B-1) injection 100 mg (has no administration in time range)    ED Course  I have reviewed the triage vital signs and the nursing notes.  Pertinent labs & imaging results that were available during my care of the patient were reviewed by me and considered in my medical decision making (see chart for details).    MDM Rules/Calculators/A&P                          Patient presents following recurrent falls.  Acutely intoxicated with daily drinking.  No obvious injuries to the head or neck although his alcohol consumption would put him at increased risk for occult injury.  Will obtain CT head and neck.  Additionally right rib and chest films obtained and right elbow films.  Imaging is notable for 3 posterior nondisplaced rib fractures.   Patient is on nocturnal oxygen and sees pulmonology.  He has mild obstructive disease.  Given his age and pulmonary issues, he is at high risk for complications related to rib fractures including pneumonia.  Incentive spirometer was ordered.  He was given 15 mg of Toradol as I did not want to provide him a narcotic medication until his alcohol or off some.  Alcohol level 214.  Given his risk for pulmonary complications and marginal O2 sats upon arrival, feel he warrants admission for observation, pulmonary toilet, pain control.   Final Clinical Impression(s) / ED Diagnoses Final diagnoses:  Closed fracture of multiple ribs of right side, initial encounter  Skin tear of right elbow without complication, initial encounter  Alcoholic intoxication without complication [facility])    Rx / DC Orders ED Discharge Orders    None       Merryl Hacker, MD 10/29/20 1752

## 2020-10-29 NOTE — ED Notes (Signed)
Patient is off oxygen  O2 levels at 95 %

## 2020-10-29 NOTE — ED Notes (Signed)
Patient off floor for scan 

## 2020-10-29 NOTE — H&P (Signed)
History and Physical   Eduardo Watts. OVZ:[id number] DOB: Feb 11, 1946 DOA: 10/29/2020  PCP: Eduardo Cruel, MD   Patient coming from: Home  Chief Complaint: Fall  HPI: Eduardo Watts. is a 75 y.o. male with medical history significant of alcohol use, CAD, GERD, hyperlipidemia, nocturnal hypoxia, neuropathy, anxiety, depression who presents after multiple falls at home.  Patient had fallen several times today in the setting of intoxication after drinking 1.5 bottles of wine.  He drinks 1-2 bottles of wine per night.  He does not think he has gone for any significant time without drinking for years. His wife called EMS after these falls.  He is uncertain if he hit his head or lost consciousness.  He is not on any blood thinners.  Reports Pain in his right ribs and right elbow. Denies fever, chills, shortness of breath, abdominal pain, constipation, diarrhea, nausea, vomiting.  ED Course: Vital signs significant for O2 sats initially around 89 to 90%.  This does appear to be somewhat chronic for him.  Lab work-up showed CMP with sodium 134 which corrects to 135 considering glucose 149, AST 46 which is stable.  CBC within normal limits.  Ethanol level elevated to 14.  Respiratory panel for flu and Covid is pending.  X-ray of the right elbow showed no acute normality.  CT head showed no acute abnormality did show some atherosclerotic disease of the brain.  CT C-spine showed spondylosis at C5-C6 and facet OA at C3-C4 and C7-T1.  No acute abnormalities on the CT C-spine.  Chest x-ray and rib films showed minimally displaced fractures of the seventh through ninth right ribs.    Review of Systems: As per HPI otherwise all other systems reviewed and are negative.  Past Medical History:  Diagnosis Date  . Anxiety   . Asthma    as a child  . Depression    sees Dr Eduardo Watts  . History of BPH    Dr.Dahlstedt  . Hyperlipidemia   . Prediabetes   . Sleep disturbances     Past Surgical  History:  Procedure Laterality Date  . LEFT HEART CATHETERIZATION WITH CORONARY ANGIOGRAM N/A 04/30/2014   Procedure: LEFT HEART CATHETERIZATION WITH CORONARY ANGIOGRAM;  Surgeon: Eduardo Booze, MD;  Location: [facility] C[facility] LAB;  Service: Cardiovascular;  Laterality: N/A;  . NASAL SEPTUM SURGERY  1975  . R cheek benign tumor      1992, again 1998.  . TONSILLECTOMY      Social History  reports that he has never smoked. He has never used smokeless tobacco. He reports current alcohol use of about 14.0 - 21.0 standard drinks of alcohol per week. He reports that he does not use drugs.  Allergies  Allergen Reactions  . Zocor [Simvastatin] Other (See Comments)    Muscle aches  . Trazodone And Nefazodone Other (See Comments)    "crazy"    Family History  Problem Relation Age of Onset  . Dementia Mother   . Stroke Mother   . Cancer - Prostate Father   . Melanoma Father   . Hyperlipidemia Sister        high cholestrol  . Depression Brother   . Suicidality Brother   . Lung disease Maternal Grandfather   . Heart attack Neg Hx   . Hypertension Neg Hx   Reviewed on admission  Prior to Admission medications   Medication Sig Start Date End Date Taking? Authorizing Provider  alfuzosin (UROXATRAL) 10 MG 24 hr tablet Take  10 mg by mouth daily.    [provider]  Alpha-Lipoic Acid 600 MG CAPS Take 1 capsule (600 mg total) by mouth daily. 04/15/19   Melvenia Beam, MD  B Complex Vitamins (B COMPLEX PO) Take 1 tablet by mouth daily.     [provider]  CALCIUM PO Take 1 tablet by mouth daily.     [provider]  carvedilol (COREG) 3.125 MG tablet  06/18/19   [provider]  Cholecalciferol (VITAMIN D3) 5000 units CAPS Take 1 capsule by mouth daily.    [provider]  cyclobenzaprine (FLEXERIL) 10 MG tablet TAKE 1 TABLET BY MOUTH AT BEDTIME 10/01/20   Eduardo Watts, Eduardo Co., MD  ezetimibe (ZETIA) 10 MG tablet Take 1 tablet (10 mg total) by mouth  daily. 06/06/17   Eduardo Booze, MD  finasteride (PROSCAR) 5 MG tablet Take 1 tablet by mouth every other night    [provider]  fluticasone (FLONASE) 50 MCG/ACT nasal spray Place 1 spray into both nostrils daily.     [provider]  gabapentin (NEURONTIN) 300 MG capsule Take 300 mg by mouth daily. 11/18/18   [provider]  hydrOXYzine (ATARAX/VISTARIL) 10 MG tablet TAKE 2 TABLETS BY MOUTH THREE TIMES DAILY AS NEEDED FOR ANXIETY 08/26/20   Eduardo Watts, Eduardo Co., MD  Krill Oil 1000 MG CAPS Take by mouth.    [provider]  LORazepam (ATIVAN) 0.5 MG tablet Take 2 tablets (1 mg total) by mouth at bedtime. 08/26/20   Eduardo Watts, Eduardo Co., MD  Magnesium 100 MG CAPS Take by mouth.    [provider]  metFORMIN (GLUCOPHAGE) 500 MG tablet Take 500 mg by mouth 2 (two) times daily with a meal.    [provider]  Omega-3 Fatty Acids (FISH OIL) 1000 MG CAPS Take 1 capsule by mouth daily.    [provider]  rosuvastatin (CRESTOR) 5 MG tablet Take 5 mg by mouth every other day. 09/24/17   [provider]  zinc sulfate 220 (50 Zn) MG capsule Take 220 mg by mouth daily.    [provider]    Physical Exam: Vitals:   10/29/20 1631 10/29/20 1742 10/29/20 1747 10/29/20 1830  BP: 115/71 122/79 125/83   Pulse: 74 79 80 76  Resp: 17 17 17    Temp: 98 F (36.7 C) 97.8 F (36.6 C) 97.8 F (36.6 C)   TempSrc: Oral Oral Oral   SpO2: 97% 94% 95%   Weight:      Height:       Physical Exam Constitutional:      General: He is not in acute distress.    Appearance: Normal appearance.  HENT:     Head: Normocephalic and atraumatic.     Mouth/Throat:     Mouth: Mucous membranes are moist.     Pharynx: Oropharynx is clear.  Eyes:     Extraocular Movements: Extraocular movements intact.     Pupils: Pupils are equal, round, and reactive to light.  Cardiovascular:     Rate and Rhythm: Normal rate and regular rhythm.      Pulses: Normal pulses.     Heart sounds: Normal heart sounds.  Pulmonary:     Effort: Pulmonary effort is normal. No respiratory distress.     Breath sounds: Normal breath sounds.  Abdominal:     General: Bowel sounds are normal. There is no distension.     Palpations: Abdomen is soft.  Tenderness: There is no abdominal tenderness.  Musculoskeletal:        General: Tenderness (Right chest wall) present. No swelling or deformity.  Skin:    General: Skin is warm and dry.     Comments: Skin tear right elbow  Neurological:     General: No focal deficit present.     Mental Status: Mental status is at baseline.    Labs on Admission: I have personally reviewed following labs and imaging studies  CBC: Recent Labs  Lab 10/29/20 1530  WBC 6.5  NEUTROABS 4.8  HGB 13.5  HCT 40.6  MCV 95.8  PLT 329    Basic Metabolic Panel: Recent Labs  Lab 10/29/20 1530  NA 134*  K 4.5  CL 100  CO2 22  GLUCOSE 149*  BUN 16  CREATININE 1.07  CALCIUM 9.0    GFR: Estimated Creatinine Clearance: 68.3 mL/min (by C-G formula based on SCr of 1.07 mg/dL).  Liver Function Tests: Recent Labs  Lab 10/29/20 1530  AST 46*  ALT 38  ALKPHOS 93  BILITOT 0.9  PROT 7.1  ALBUMIN 3.9    Urine analysis:    Component Value Date/Time   COLORURINE YELLOW 07/02/2012 2014   APPEARANCEUR CLEAR 07/02/2012 2014   LABSPEC 1.011 07/02/2012 2014   PHURINE 7.0 07/02/2012 2014   GLUCOSEU NEGATIVE 07/02/2012 2014   HGBUR NEGATIVE 07/02/2012 2014   [place] NEGATIVE 07/02/2012 2014   [place] 07/02/2012 2014   PROTEINUR NEGATIVE 07/02/2012 2014   UROBILINOGEN 0.2 07/02/2012 2014   NITRITE NEGATIVE 07/02/2012 2014   LEUKOCYTESUR NEGATIVE 07/02/2012 2014    Radiological Exams on Admission: DG Ribs Unilateral W/Chest Right  Result Date: 10/29/2020 CLINICAL DATA:  Intoxicated, fell, pain EXAM: RIGHT RIBS AND CHEST - 3+ VIEW COMPARISON:  10/08/2019 FINDINGS: Frontal view of the chest as  well as frontal and oblique views of the right thoracic cage are obtained. The cardiac silhouette is unremarkable. No airspace disease, effusion, or pneumothorax. There are minimally displaced right posterolateral seventh through ninth rib fractures. No other acute bony abnormalities. IMPRESSION: 1. Minimally displaced right posterolateral seventh through ninth rib fractures. 2. No effusion or pneumothorax. Electronically Signed   By: Randa Ngo M.D.   On: 10/29/2020 16:15   DG Elbow Complete Right  Result Date: 10/29/2020 CLINICAL DATA:  Intoxicated, fell, right elbow pain EXAM: RIGHT ELBOW - COMPLETE 3+ VIEW COMPARISON:  None. FINDINGS: Frontal, bilateral oblique, lateral views of the right elbow are obtained. No fracture, subluxation, or dislocation. Joint spaces are well preserved. Soft tissues are normal. No joint effusion. IMPRESSION: 1. Unremarkable right elbow. Electronically Signed   By: Randa Ngo M.D.   On: 10/29/2020 16:13   CT Head Wo Contrast  Result Date: 10/29/2020 CLINICAL DATA:  Fall. EXAM: CT HEAD WITHOUT CONTRAST TECHNIQUE: Contiguous axial images were obtained from the base of the skull through the vertex without intravenous contrast. COMPARISON:  12/01/2017 FINDINGS: Brain: Generalized atrophy. No sign of old or acute focal infarction, mass lesion, hemorrhage, hydrocephalus or extra-axial collection. Vascular: There is atherosclerotic calcification of the major vessels at the base of the brain. Skull: Normal Sinuses/Orbits: Clear/normal Other: None IMPRESSION: No acute or traumatic finding. Generalized atrophy. Atherosclerotic calcification of the major vessels at the base of the brain. Electronically Signed   By: Nelson Chimes M.D.   On: 10/29/2020 16:41   CT Cervical Spine Wo Contrast  Result Date: 10/29/2020 CLINICAL DATA:  Fall today with trauma to the head and neck. EXAM: CT CERVICAL  SPINE WITHOUT CONTRAST TECHNIQUE: Multidetector CT imaging of the cervical spine was  performed without intravenous contrast. Multiplanar CT image reconstructions were also generated. COMPARISON:  12/01/2017 FINDINGS: Alignment: Normal Skull base and vertebrae: No fracture or primary bone lesion. Soft tissues and spinal canal: Negative Disc levels: Degenerative spondylosis at C5-6 with right foraminal encroachment by osteophytes. Facet osteoarthritis on the right at C3-4 and C7-T1. Upper chest: Negative Other: None IMPRESSION: No acute or traumatic finding. Degenerative spondylosis at C5-6 with right foraminal encroachment by osteophytes. Facet osteoarthritis on the right at C3-4 and C7-T1. Electronically Signed   By: Nelson Chimes M.D.   On: 10/29/2020 16:43   EKG: Not yet performed  Assessment/Plan Principal Problem:   Multiple fractures of ribs, right side, initial encounter for closed fracture Active Problems:   Mixed hyperlipidemia   Coronary atherosclerosis of native coronary artery   Right-sided chest wall pain   Nocturnal hypoxia   Alcohol use   Prediabetes   Peripheral polyneuropathy  Falls Multiple rib fractures > Patient with falls at home secondary to intoxication. > Found to have mildly displaced rib fractures in right ribs 7 through 9 > Also with saturations in the high 80s to low 90s on room air in the ED.  This is somewhat chronic to him but is typically in the low 90s during the day with nocturnal hypoxia. > We will monitor overnight for any development of complications considering his age, history of nocturnal hypoxia, 3 consecutive rib fractures. - Monitor on telemetry - Continue oxygen therapy - Pain control as needed - Pulmonary toilet  Nocturnal hypoxia > Has 3 history of this with possible mild obstruction.  Does not take inhalers at home. > Uses oxygen at night but not during the day due to nocturnal desaturations -Continue oxygen via nasal cannula as above  Alcohol use > History of alcohol use drinking about 2 bottles of wine per day.  Was at  extubated on presentation with alcohol level of 214. - CIWA without Ativan as unlikely to start withdrawal overnight given positive alcohol level on admission  CAD Hyperlipidemia - Continue home Coreg, Zetia, Crestor  BPH - Continue home alfuzosin, finasteride  Prediabetes - Continue Metformin  Neuropathy - Continue home gabapentin  Anxiety and depression - Continue home as needed hydroxyzine and nightly Ativan  DVT prophylaxis: SCDs  Code Status:   Full  Family Communication:  Wife updated by phone Disposition Plan:   Patient is from:  Home  Anticipated DC to:  Home  Anticipated DC date:  1 day  Anticipated DC barriers: None  Consults called:  None  Admission status:  Observation, telemetry   Severity of Illness: The appropriate patient status for this patient is OBSERVATION. Observation status is judged to be reasonable and necessary in order to provide the required intensity of service to ensure the patient's safety. The patient's presenting symptoms, physical exam findings, and initial radiographic and laboratory data in the context of their medical condition is felt to place them at decreased risk for further clinical deterioration. Furthermore, it is anticipated that the patient will be medically stable for discharge from the hospital within 2 midnights of admission. The following factors support the patient status of observation.   " The patient's presenting symptoms include intoxication, chest wall pain, elbow pain, fall. " The physical exam findings include chest wall pain, skin tear right elbow. " The initial radiographic and laboratory data are minimally displaced fractures of the seventh through ninth right rib.  Sodium 134,  glucose 149, AST 46, EtOH level 214.   Marcelyn Bruins MD [facility]  How to contact the [facility] Attending or Consulting provider [place] or covering provider during after hours [place], for this patient?   1. Check the care team in  [facility] and look for a) attending/consulting TR[facility] provider listed and b) the [facility] team listed 2. Log into [email or web address] and use [place]'s universal password to access. If you do not have the password, please contact the hospital operator. 3. Locate the [facility] provider you are looking for under [facility] and page to a number that you can be directly reached. 4. If you still have difficulty reaching the provider, please page the [facility] (Director on Call) for the Hospitalists listed on amion for assistance.  10/29/2020, 6:50 PM

## 2020-10-30 ENCOUNTER — Encounter (HOSPITAL_COMMUNITY): Payer: Self-pay | Admitting: Internal Medicine

## 2020-10-30 ENCOUNTER — Other Ambulatory Visit: Payer: Self-pay

## 2020-10-30 DIAGNOSIS — G629 Polyneuropathy, unspecified: Secondary | ICD-10-CM

## 2020-10-30 DIAGNOSIS — E871 Hypo-osmolality and hyponatremia: Secondary | ICD-10-CM

## 2020-10-30 DIAGNOSIS — S2241XA Multiple fractures of ribs, right side, initial encounter for closed fracture: Secondary | ICD-10-CM | POA: Diagnosis not present

## 2020-10-30 DIAGNOSIS — I2510 Atherosclerotic heart disease of native coronary artery without angina pectoris: Secondary | ICD-10-CM | POA: Diagnosis not present

## 2020-10-30 DIAGNOSIS — R7303 Prediabetes: Secondary | ICD-10-CM

## 2020-10-30 DIAGNOSIS — E782 Mixed hyperlipidemia: Secondary | ICD-10-CM | POA: Diagnosis not present

## 2020-10-30 DIAGNOSIS — Z7289 Other problems related to lifestyle: Secondary | ICD-10-CM | POA: Diagnosis not present

## 2020-10-30 LAB — CBC
HCT: 40.5 % (ref 39.0–52.0)
Hemoglobin: 13.6 g/dL (ref 13.0–17.0)
MCH: 31.8 pg (ref 26.0–34.0)
MCHC: 33.6 g/dL (ref 30.0–36.0)
MCV: 94.6 fL (ref 80.0–100.0)
Platelets: 193 10*3/uL (ref 150–400)
RBC: 4.28 MIL/uL (ref 4.22–5.81)
RDW: 12.3 % (ref 11.5–15.5)
WBC: 5.6 10*3/uL (ref 4.0–10.5)
nRBC: 0 % (ref 0.0–0.2)

## 2020-10-30 LAB — BASIC METABOLIC PANEL
Anion gap: 9 (ref 5–15)
BUN: 18 mg/dL (ref 8–23)
CO2: 25 mmol/L (ref 22–32)
Calcium: 8.9 mg/dL (ref 8.9–10.3)
Chloride: 100 mmol/L (ref 98–111)
Creatinine, Ser: 0.97 mg/dL (ref 0.61–1.24)
GFR, Estimated: >60 mL/min (ref >60)
Glucose, Bld: 125 mg/dL — ABNORMAL HIGH (ref 70–99)
Potassium: 4.6 mmol/L (ref 3.5–5.1)
Sodium: 134 mmol/L — ABNORMAL LOW (ref 135–145)

## 2020-10-30 LAB — HEPATIC FUNCTION PANEL
ALT: 34 U/L (ref 0–44)
AST: 39 U/L (ref 15–41)
Albumin: 3.9 g/dL (ref 3.5–5.0)
Alkaline Phosphatase: 90 U/L (ref 38–126)
Bilirubin, Direct: 0.3 mg/dL — ABNORMAL HIGH (ref 0.0–0.2)
Indirect Bilirubin: 1.2 mg/dL — ABNORMAL HIGH (ref 0.3–0.9)
Total Bilirubin: 1.5 mg/dL — ABNORMAL HIGH (ref 0.3–1.2)
Total Protein: 7.1 g/dL (ref 6.5–8.1)

## 2020-10-30 LAB — SARS CORONAVIRUS 2 (TAT 6-24 HRS): SARS Coronavirus 2: NEGATIVE

## 2020-10-30 MED ORDER — KETOROLAC TROMETHAMINE 15 MG/ML IJ SOLN: 15.0000 mg | Freq: Four times a day (QID) | INTRAMUSCULAR | Status: DC | PRN

## 2020-10-30 MED ORDER — LIDOCAINE 5 % EX PTCH
1.0000 | MEDICATED_PATCH | CUTANEOUS | Status: DC
  Administered 2020-10-30 – 2020-10-31 (×2): 1 via TRANSDERMAL
  Filled 2020-10-30 (×2): qty 1

## 2020-10-30 MED ORDER — POLYETHYLENE GLYCOL 3350 17 G PO PACK: 17.0000 g | PACK | Freq: Every day | ORAL | 0 refills | Status: DC | PRN

## 2020-10-30 MED ORDER — ACETAMINOPHEN 325 MG PO TABS: 650.0000 mg | ORAL_TABLET | Freq: Four times a day (QID) | ORAL | 0 refills | Status: DC | PRN

## 2020-10-30 MED ORDER — FOLIC ACID 1 MG PO TABS: 1.0000 mg | ORAL_TABLET | Freq: Every day | ORAL | 0 refills | Status: DC

## 2020-10-30 MED ORDER — SODIUM CHLORIDE 0.9 % IV SOLN: INTRAVENOUS | Status: DC

## 2020-10-30 MED ORDER — HYDRALAZINE HCL 20 MG/ML IJ SOLN: 5.0000 mg | Freq: Four times a day (QID) | INTRAMUSCULAR | Status: DC | PRN

## 2020-10-30 MED ORDER — LIDOCAINE 5 % EX PTCH: 1.0000 | MEDICATED_PATCH | CUTANEOUS | 0 refills | Status: DC

## 2020-10-30 MED ORDER — THIAMINE HCL 100 MG PO TABS: 100.0000 mg | ORAL_TABLET | Freq: Every day | ORAL | 0 refills | Status: DC

## 2020-10-30 MED ORDER — ADULT MULTIVITAMIN W/MINERALS CH: 1.0000 | ORAL_TABLET | Freq: Every day | ORAL | 0 refills | Status: DC

## 2020-10-30 NOTE — Evaluation (Signed)
Occupational Therapy Evaluation Patient Details Name: Eduardo Watts. MRN: [id number] DOB: September 20, 1945 Today's Date: 10/30/2020    History of Present Illness Pt is 75 yo male admitted to ED on 10/29/20 after fall while intoxicated with R ribs fx 7-9. Pt with medical history significant of alcohol use, CAD, GERD, hyperlipidemia, nocturnal hypoxia, neuropathy, anxiety, depression   Clinical Impression   Mr. Missael Ferrari is a 75 year old man who presents with decreased activity tolerance and complaints of pain secondary to rib fractures as well as impaired balance. Patient able to ambulate without device and perform ADLs with increased time and altered positioning. Patient educated on home safety - recommending use of shower chair and RW as patient unsteady due to neuropathy in feet at baseline and not slightly more altered due to pain. Recommended patient limit bending - using figure four method and using log roll technique to get in and out of bed or sleeping in recliner for comfort. Patient predominantly limited by pain. All education complete. No further OT needs.    Follow Up Recommendations  No OT follow up    Equipment Recommendations  Tub/shower seat    Recommendations for Other Services       Precautions / Restrictions Precautions Precautions: Fall Restrictions Weight Bearing Restrictions: No      Mobility Bed Mobility Overal bed mobility: Needs Assistance Bed Mobility: Rolling;Sit to Sidelying Rolling: Min assist       Sit to sidelying: Mod assist General bed mobility comments: Educated on log roll technique.  Required assist to get both legs into bed.  Discussed sleeping in recliner due to rib pain    Transfers Overall transfer level: Needs assistance Equipment used: None Transfers: Sit to/from Stand Sit to Stand: Min guard;From elevated surface         General transfer comment: increased time to rise; cues for hand placement    Balance Overall  balance assessment: Needs assistance Sitting-balance support: No upper extremity supported Sitting balance-Leahy Scale: Good     Standing balance support: No upper extremity supported Standing balance-Leahy Scale: Fair                             ADL either performed or assessed with clinical judgement   ADL Overall ADL's : Modified independent                                       General ADL Comments: Increased time, modified positioning to perform ADLs secondary to pain.     Vision Baseline Vision/History: No visual deficits       Perception     Praxis      Pertinent Vitals/Pain Pain Assessment: 0-10 Pain Score: 9  Faces Pain Scale: Hurts even more Pain Location: R ribs Pain Descriptors / Indicators: Sharp Pain Intervention(s): Limited activity within patient's tolerance;Monitored during session;RN gave pain meds during session     Hand Dominance     Extremity/Trunk Assessment Upper Extremity Assessment Upper Extremity Assessment: Overall WFL for tasks assessed (strength testing deferred due to pain. Appears functional)   Lower Extremity Assessment Lower Extremity Assessment: Defer to PT evaluation   Cervical / Trunk Assessment Cervical / Trunk Assessment: Other exceptions Cervical / Trunk Exceptions: Limited trunk movement due to pain.  Noted pt with decreased movement R cheek/mouth muscles - reports chronic from prior surgery.   Communication  Communication Communication: No difficulties   Cognition Arousal/Alertness: Awake/alert Behavior During Therapy: WFL for tasks assessed/performed Overall Cognitive Status: Within Functional Limits for tasks assessed                                     General Comments  VSS.  Educated pt on transfer techniques for decreased pain with rib fx.  Also, recommended sleeping in recliner for pain control.  Recommended RW for safety.  Gave pt incentive spirometer and educated on use  - pt performed x 3 correctly from 1000-1500 mL.  Encouraged deep breathing and IS use 10xhr to prevent PNE.    Exercises     Shoulder Instructions      Home Living Family/patient expects to be discharged to:: Private residence Living Arrangements: Spouse/significant other Available Help at Discharge: Family;Available 24 hours/day Type of Home: House Home Access: Level entry     Home Layout: Two level;Able to live on main level with bedroom/bathroom     Bathroom Shower/Tub: Occupational psychologist: Handicapped height     Home Equipment: Shower seat;Cane - single point          Prior Functioning/Environment Level of Independence: Independent        Comments: Reports falls while intoxicated.        OT Problem List: Decreased activity tolerance;Pain;Decreased knowledge of use of DME or AE      OT Treatment/Interventions:      OT Goals(Current goals can be found in the care plan section) Acute Rehab OT Goals Patient Stated Goal: return home; decrease pain OT Goal Formulation: All assessment and education complete, DC therapy  OT Frequency:     Barriers to D/C:            Co-evaluation   Reason for Co-Treatment: For patient/therapist safety (s/p fall, eval in ED) PT goals addressed during session: Mobility/safety with mobility OT goals addressed during session: ADL's and self-care      AM-PAC OT "6 Clicks" Daily Activity     Outcome Measure Help from another person eating meals?: None Help from another person taking care of personal grooming?: None Help from another person toileting, which includes using toliet, bedpan, or urinal?: None Help from another person bathing (including washing, rinsing, drying)?: None Help from another person to put on and taking off regular upper body clothing?: None Help from another person to put on and taking off regular lower body clothing?: None 6 Click Score: 24   End of Session Equipment Utilized During  Treatment: Gait belt Nurse Communication: Mobility status  Activity Tolerance: Patient tolerated treatment well Patient left: in bed;with call bell/phone within reach  OT Visit Diagnosis: Unsteadiness on feet (R26.81);Pain;History of falling (Z91.81)                Time: 1005-1025 OT Time Calculation (min): 20 min Charges:  OT General Charges $OT Visit: 1 Visit OT Evaluation $OT Eval Low Complexity: 1 Low  Wilgus Deyton, OTR/L Mobile  Office [phone] Pager: [place] 10/30/2020, 1:03 PM

## 2020-10-30 NOTE — ED Notes (Signed)
Report given to Cindy, RN.

## 2020-10-30 NOTE — ED Notes (Signed)
R elbow skin tear cleaned with wound spray, wrapped with a non-stick dressing and curlex.

## 2020-10-30 NOTE — ED Notes (Signed)
ED TO INPATIENT HANDOFF REPORT  Name/Age/Gender Eduardo Watts. 75 y.o. male  Code Status    Code Status Orders  (From admission, onward)         Start     Ordered   10/29/20 1831  Full code  Continuous        10/29/20 1832        Code Status History    Date Active Date Inactive Code Status Order ID Comments User Context   10/29/2020 1748 10/29/2020 1832 Full Code [id number]  Merryl Hacker, MD ED   04/30/2014 0825 04/30/2014 1528 Full Code [id number]  [name], MD Inpatient   Advance Care Planning Activity    Advance Directive Documentation   Flowsheet Row Most Recent Value  Type of Advance Directive Healthcare Power of Attorney, Living will  Pre-existing out of facility DNR order (yellow form or pink MOST form) --  "MOST" Form in Place? --      Home/SNF/Other Home  Chief Complaint Multiple fractures of ribs, right side, initial encounter for closed fracture [S22.41XA]  Level of Care/Admitting Diagnosis ED Disposition    ED Disposition Condition [place]: [place] [[id number]]  Level of Care: Telemetry [5]  Admit to tele based on following criteria: Other see comments  Comments: CLose monitoring following traumatic chest wall injury with multiple rib fractures  Covid Evaluation: Asymptomatic Screening Protocol (No Symptoms)  Diagnosis: Multiple fractures of ribs, right side, initial encounter for closed fracture [[id number]]  Admitting Physician: Marcelyn Bruins [[id number]]  Attending Physician: Marcelyn Bruins [[id number]]       Medical History Past Medical History:  Diagnosis Date  . Anxiety   . Asthma    as a child  . Depression    sees Dr [name]  . History of BPH    Dr.Dahlstedt  . Hyperlipidemia   . Prediabetes   . Sleep disturbances     Allergies Allergies  Allergen Reactions  . Zocor [Simvastatin] Other (See Comments)    Muscle aches  . Trazodone And Nefazodone Other (See  Comments)    "crazy"    IV Location/Drains/Wounds Patient Lines/Drains/Airways Status    Active Line/Drains/Airways    Name Placement date Placement time Site Days   Peripheral IV 10/29/20 Left Antecubital 10/29/20  1528  Antecubital  1          Labs/Imaging Results for orders placed or performed during the hospital encounter of 10/29/20 (from the past 48 hour(s))  CBC with Differential     Status: None   Collection Time: 10/29/20  3:30 PM  Result Value Ref Range   WBC 6.5 4.0 - 10.5 K/uL   RBC 4.24 4.22 - 5.81 MIL/uL   Hemoglobin 13.5 13.0 - 17.0 g/dL   HCT 40.6 39.0 - 52.0 %   MCV 95.8 80.0 - 100.0 fL   MCH 31.8 26.0 - 34.0 pg   MCHC 33.3 30.0 - 36.0 g/dL   RDW 12.4 11.5 - 15.5 %   Platelets 215 150 - 400 K/uL   nRBC 0.0 0.0 - 0.2 %   Neutrophils Relative % 74 %   Neutro Abs 4.8 1.7 - 7.7 K/uL   Lymphocytes Relative 13 %   Lymphs Abs 0.9 0.7 - 4.0 K/uL   Monocytes Relative 10 %   Monocytes Absolute 0.6 0.1 - 1.0 K/uL   Eosinophils Relative 2 %   Eosinophils Absolute 0.2 0.0 - 0.5 K/uL   Basophils Relative 1 %  Basophils Absolute 0.1 0.0 - 0.1 K/uL   Immature Granulocytes 0 %   Abs Immature Granulocytes 0.02 0.00 - 0.07 K/uL    Comment: Performed at [facility], [place] [address], [place], [place] [address]  Ethanol     Status: Abnormal   Collection Time: 10/29/20  3:30 PM  Result Value Ref Range   Alcohol, Ethyl (B) 214 (H) <10 mg/dL    Comment: (NOTE) Lowest detectable limit for serum alcohol is 10 mg/dL.  For medical purposes only. Performed at [facility], [place] [address], [place], [place] [address]   Comprehensive metabolic panel     Status: Abnormal   Collection Time: 10/29/20  3:30 PM  Result Value Ref Range   Sodium 134 (L) 135 - 145 mmol/L   Potassium 4.5 3.5 - 5.1 mmol/L   Chloride 100 98 - 111 mmol/L   CO2 22 22 - 32 mmol/L   Glucose, Bld 149 (H) 70 - 99 mg/dL    Comment: Glucose reference range applies only  to samples taken after fasting for at least 8 hours.   BUN 16 8 - 23 mg/dL   Creatinine, Ser 1.07 0.61 - 1.24 mg/dL   Calcium 9.0 8.9 - 10.3 mg/dL   Total Protein 7.1 6.5 - 8.1 g/dL   Albumin 3.9 3.5 - 5.0 g/dL   AST 46 (H) 15 - 41 U/L   ALT 38 0 - 44 U/L   Alkaline Phosphatase 93 38 - 126 U/L   Total Bilirubin 0.9 0.3 - 1.2 mg/dL   GFR, Estimated >60 >60 mL/min    Comment: (NOTE) Calculated using the CKD-EPI Creatinine Equation (2021)    Anion gap 12 5 - 15    Comment: Performed at [facility], [place] [address], [place], [place] [address]  Magnesium     Status: None   Collection Time: 10/29/20  3:30 PM  Result Value Ref Range   Magnesium 2.3 1.7 - 2.4 mg/dL    Comment: Performed at [facility], [place] [address] [place], [place] [address]  Phosphorus     Status: None   Collection Time: 10/29/20  3:30 PM  Result Value Ref Range   Phosphorus 3.0 2.5 - 4.6 mg/dL    Comment: Performed at [facility], [place] [address], [place], [place] [address]  SARS CORONAVIRUS 2 (TAT 6-24 HRS) Nasopharyngeal Nasopharyngeal Swab     Status: None   Collection Time: 10/29/20  6:37 PM   Specimen: Nasopharyngeal Swab  Result Value Ref Range   SARS Coronavirus 2 NEGATIVE NEGATIVE    Comment: (NOTE) SARS-CoV-2 target nucleic acids are NOT DETECTED.  The SARS-CoV-2 RNA is generally detectable in upper and lower respiratory specimens during the acute phase of infection. Negative results do not preclude SARS-CoV-2 infection, do not rule out co-infections with other pathogens, and should not be used as the sole basis for treatment or other patient management decisions. Negative results must be combined with clinical observations, patient history, and epidemiological information. The expected result is Negative.  Fact Sheet for Patients: SugarRoll.be  Fact Sheet for Healthcare  Providers: https://www.woods-mathews.com/  This test is not yet approved or cleared by the [place] FDA and  has been authorized for detection and/or diagnosis of SARS-CoV-2 by FDA under an Emergency Use Authorization (EUA). This EUA will remain  in effect (meaning this test can be used) for the duration of the COVID-19 declaration under Se ction 564(b)(1) of the Act, 21 U.S.C.  section 360bbb-3(b)(1), unless the authorization is terminated or revoked sooner.  Performed at [facility], [place] [address] [place], [place] [address]   CBC     Status: None   Collection Time: 10/30/20  7:51 AM  Result Value Ref Range   WBC 5.6 4.0 - 10.5 K/uL   RBC 4.28 4.22 - 5.81 MIL/uL   Hemoglobin 13.6 13.0 - 17.0 g/dL   HCT 40.5 39.0 - 52.0 %   MCV 94.6 80.0 - 100.0 fL   MCH 31.8 26.0 - 34.0 pg   MCHC 33.6 30.0 - 36.0 g/dL   RDW 12.3 11.5 - 15.5 %   Platelets 193 150 - 400 K/uL   nRBC 0.0 0.0 - 0.2 %    Comment: Performed at [facility], [place] [address], [place], [place] [address]  Basic metabolic panel     Status: Abnormal   Collection Time: 10/30/20  7:51 AM  Result Value Ref Range   Sodium 134 (L) 135 - 145 mmol/L   Potassium 4.6 3.5 - 5.1 mmol/L   Chloride 100 98 - 111 mmol/L   CO2 25 22 - 32 mmol/L   Glucose, Bld 125 (H) 70 - 99 mg/dL    Comment: Glucose reference range applies only to samples taken after fasting for at least 8 hours.   BUN 18 8 - 23 mg/dL   Creatinine, Ser 0.97 0.61 - 1.24 mg/dL   Calcium 8.9 8.9 - 10.3 mg/dL   GFR, Estimated >60 >60 mL/min    Comment: (NOTE) Calculated using the CKD-EPI Creatinine Equation (2021)    Anion gap 9 5 - 15    Comment: Performed at [facility], [place] [address], [place], [place] [address]  Hepatic function panel     Status: Abnormal   Collection Time: 10/30/20  7:51 AM  Result Value Ref Range   Total Protein 7.1 6.5 - 8.1 g/dL   Albumin 3.9 3.5 - 5.0 g/dL   AST 39 15 - 41 U/L    ALT 34 0 - 44 U/L   Alkaline Phosphatase 90 38 - 126 U/L   Total Bilirubin 1.5 (H) 0.3 - 1.2 mg/dL   Bilirubin, Direct 0.3 (H) 0.0 - 0.2 mg/dL   Indirect Bilirubin 1.2 (H) 0.3 - 0.9 mg/dL    Comment: Performed at [facility], [place] [address] [place] [address], [place], [place] [address]   DG Ribs Unilateral W/Chest Right  Result Date: 10/29/2020 CLINICAL DATA:  Intoxicated, fell, pain EXAM: RIGHT RIBS AND CHEST - 3+ VIEW COMPARISON:  10/08/2019 FINDINGS: Frontal view of the chest as well as frontal and oblique views of the right thoracic cage are obtained. The cardiac silhouette is unremarkable. No airspace disease, effusion, or pneumothorax. There are minimally displaced right posterolateral seventh through ninth rib fractures. No other acute bony abnormalities. IMPRESSION: 1. Minimally displaced right posterolateral seventh through ninth rib fractures. 2. No effusion or pneumothorax. Electronically Signed   By: Randa Ngo M.D.   On: 10/29/2020 16:15   DG Elbow Complete Right  Result Date: 10/29/2020 CLINICAL DATA:  Intoxicated, fell, right elbow pain EXAM: RIGHT ELBOW - COMPLETE 3+ VIEW COMPARISON:  None. FINDINGS: Frontal, bilateral oblique, lateral views of the right elbow are obtained. No fracture, subluxation, or dislocation. Joint spaces are well preserved. Soft tissues are normal. No joint effusion. IMPRESSION: 1. Unremarkable right elbow. Electronically Signed   By: Randa Ngo M.D.   On: 10/29/2020 16:13   CT Head Wo Contrast  Result  Date: 10/29/2020 CLINICAL DATA:  Fall. EXAM: CT HEAD WITHOUT CONTRAST TECHNIQUE: Contiguous axial images were obtained from the base of the skull through the vertex without intravenous contrast. COMPARISON:  12/01/2017 FINDINGS: Brain: Generalized atrophy. No sign of old or acute focal infarction, mass lesion, hemorrhage, hydrocephalus or extra-axial collection. Vascular: There is atherosclerotic calcification of the major vessels at the base of the  brain. Skull: Normal Sinuses/Orbits: Clear/normal Other: None IMPRESSION: No acute or traumatic finding. Generalized atrophy. Atherosclerotic calcification of the major vessels at the base of the brain. Electronically Signed   By: Nelson Chimes M.D.   On: 10/29/2020 16:41   CT Cervical Spine Wo Contrast  Result Date: 10/29/2020 CLINICAL DATA:  Fall today with trauma to the head and neck. EXAM: CT CERVICAL SPINE WITHOUT CONTRAST TECHNIQUE: Multidetector CT imaging of the cervical spine was performed without intravenous contrast. Multiplanar CT image reconstructions were also generated. COMPARISON:  12/01/2017 FINDINGS: Alignment: Normal Skull base and vertebrae: No fracture or primary bone lesion. Soft tissues and spinal canal: Negative Disc levels: Degenerative spondylosis at C5-6 with right foraminal encroachment by osteophytes. Facet osteoarthritis on the right at C3-4 and C7-T1. Upper chest: Negative Other: None IMPRESSION: No acute or traumatic finding. Degenerative spondylosis at C5-6 with right foraminal encroachment by osteophytes. Facet osteoarthritis on the right at C3-4 and C7-T1. Electronically Signed   By: Nelson Chimes M.D.   On: 10/29/2020 16:43    Pending Labs Unresulted Labs (From admission, onward)         None      Vitals/Pain Today's Vitals   10/30/20 1215 10/30/20 1230 10/30/20 1245 10/30/20 1300  BP: 139/82 139/82 139/80 (!) 148/82  Pulse: 73 73 82 70  Resp:    16  Temp:      TempSrc:      SpO2: 92% 92% 95% 94%  Weight:      Height:      PainSc:        Isolation Precautions No active isolations  Medications Medications  thiamine tablet 100 mg (100 mg Oral Patient Refused/Not Given 10/30/20 1017)    Or  thiamine (B-1) injection 100 mg ( Intravenous See Alternative 10/30/20 1017)  rosuvastatin (CRESTOR) tablet 5 mg (5 mg Oral Given 10/30/20 1009)  ezetimibe (ZETIA) tablet 10 mg (10 mg Oral Given 10/29/20 2228)  carvedilol (COREG) tablet 3.125 mg (3.125 mg Oral  Given 10/30/20 0749)  hydrOXYzine (ATARAX/VISTARIL) tablet 10 mg (10 mg Oral Given 10/30/20 0403)  gabapentin (NEURONTIN) capsule 300 mg (300 mg Oral Given 10/30/20 1009)  sodium chloride flush (NS) 0.9 % injection 3 mL (3 mLs Intravenous Given 10/30/20 1018)  acetaminophen (TYLENOL) tablet 650 mg (has no administration in time range)    Or  acetaminophen (TYLENOL) suppository 650 mg (has no administration in time range)  polyethylene glycol (MIRALAX / GLYCOLAX) packet 17 g (has no administration in time range)  HYDROcodone-acetaminophen (NORCO/VICODIN) 5-325 MG per tablet 1 tablet (1 tablet Oral Given 10/30/20 1008)  HYDROmorphone (DILAUDID) injection 0.5 mg (has no administration in time range)  folic acid (FOLVITE) tablet 1 mg (1 mg Oral Given 10/30/20 1009)  multivitamin with minerals tablet 1 tablet (1 tablet Oral Given 10/30/20 1009)  alfuzosin (UROXATRAL) 24 hr tablet 10 mg (10 mg Oral Given 10/30/20 1010)  finasteride (PROSCAR) tablet 5 mg (5 mg Oral Given 10/30/20 1010)  LORazepam (ATIVAN) tablet 1 mg (1 mg Oral Given 10/29/20 2120)  metFORMIN (GLUCOPHAGE) tablet 500 mg (500 mg Oral Patient Refused/Not Given 10/30/20 0748)  lip balm (CARMEX) ointment (has no administration in time range)  0.9 %  sodium chloride infusion ( Intravenous New Bag/Given 10/30/20 1000)  lidocaine (LIDODERM) 5 % 1 patch (1 patch Transdermal Patch Applied 10/30/20 1146)  ketorolac (TORADOL) 15 MG/ML injection 15 mg (has no administration in time range)  Tdap (BOOSTRIX) injection 0.5 mL (0.5 mLs Intramuscular Given 10/29/20 1825)  ketorolac (TORADOL) 30 MG/ML injection 30 mg (30 mg Intramuscular Given 10/29/20 1823)    Mobility Walks

## 2020-10-30 NOTE — Progress Notes (Signed)
PROGRESS NOTE    [name]  MOQ:[id number] DOB: 1946-04-15 DOA: 10/29/2020 PCP: [name], MD  Brief Narrative:  The patient is a 75 year old overweight Caucasian male with a past medical history significant for but not limited to alcohol use, CAD, GERD, hyperlipidemia, history of nocturnal hypoxia, neuropathy, anxiety and depression as well as other comorbidities who presented after a fall at home.  Patient had fallen several times in the setting of his intoxication after drinking almost 2 bottles of wine yesterday.  He reportedly drinks 1-2 bottles of wine per night and does not think he is gone for any significant amount of time without drinking for years.  Because he had fallen multiple times and hurt himself EMS was called by his wife after the falls.  Is uncertain if he hit his head or loss consciousness and he is not on any blood thinners.  He reported pain in his right ribs and his right elbow but denies any other complaints.  Initially we brought to the ED vital signs were significant for O2 sats around 89 to 90% which appeared to be somewhat chronic.  Labs showed hyponatremia and elevated glucose level.  Ethanol level was 214.  Respiratory virus panel and Covid testing were negative.  He had multiple imaging studies done including a CT of the head which showed no acute intracranial abnormalities but did show atherosclerotic disease of the brain.  Patient also underwent a CT scan C-spine which showed spondylosis at C5-C6 and facet osteoarthritis at C3-C4 and C7-T1.  Chest x-ray was done and showed minimally displaced rib fractures from the seventh of the ninth right ribs.  He was admitted and he steadily improved but his limiting factor is intractable pain now secondary to his rib fractures.  He OT evaluated and recommended supervision but no home health services and recommended a rolling walker.  Patient ambulated and did not desaturate.  He is going to be discharged home but  patient was in too much pain to be discharged at this time and will continue monitor overnight and treat his intractable rib pain.  He was given a lidocaine patch and scheduled ketorolac.  Assessment & Plan:   Principal Problem:   Multiple fractures of ribs, right side, initial encounter for closed fracture Active Problems:   Mixed hyperlipidemia   Coronary atherosclerosis of native coronary artery   Right-sided chest wall pain   Nocturnal hypoxia   Alcohol use   Prediabetes   Peripheral polyneuropathy  Recurrent Falls Minimally Displaced Multiple rib fractures on Right  Intractable Pain 2/2 Rib Displacement  -Patient with falls at home secondary to intoxication. -Found to have mildly displaced rib fractures in right ribs 7 through 9 -Also with saturations in the high 80s to low 90s on room air in the ED.  This is somewhat chronic to him but is typically in the low 90s during the day with nocturnal hypoxia. -Monitored overnight for any development of complications considering his age, history of nocturnal hypoxia, 3 consecutive rib fractures. -Continue to Monitor on telemetry -Continued oxygen therapy and Weaned to Room Air -Pain control as needed; Continues to Have Intractable Pain so added Lidocaine Patch and Ketorolac; C/w Acetaminophen 650 mg po q6hprn, Hydrocodone-Acetaminophen 1 tab po q4hprn Moderate Pain, and 0.5 mg IV Hydromorphone q4hprn Sever Pain -Pulmonary Toilet, Incentive Spirometry, and Flutter Valve -PT/OT recommending no Follow up but do recommend RW with 5" Wheels  Nocturnal Hypoxia -Has 3 history of this with possible mild obstruction.  Does  not take inhalers at home -Uses oxygen at night but not during the day due to nocturnal desaturations -Ambulatory Home O2 Screen Done and did not desaturate -SpO2: 93 % O2 Flow Rate (L/min): 2 L/min; Now Weaned off of O2 Altogether  -Continue oxygen via nasal cannula as above  Alcohol use -History of alcohol use drinking  about 2 bottles of wine per day.  Was Intoxicated on presentation with alcohol level of 214. -CIWA without Ativan as unlikely to start withdrawal overnight given positive alcohol level on admission -Will need Folic Acid, MVI+Minerals, and Thiamine  CAD Hyperlipidemia -Continue home Carvedilol 3.125 mg po BID, Ezetimibe 10 mg po qHS, and Rosuvastatin 5 mg po Daily  -No Chest Pain but complains of Rib Pain  HTN -Patient's BP elevated in the setting of Pain -C/w with Carvedilol 3.125 mg po BID -Will add IV Hydralazine 5 mg q6hprn for SBP>160 or DBP>90  BPH -Continue home Alfuzosin 10 mg po Daily and Finasteride 5 mg po EOD  Hyponatremia -In the setting of alcoholism -Patient sodium is stable at 134 -Continue to monitor and trend repeat CMP in a.m.  Prediabetes -Continue Metformin 500 mg po BID  Neuropathy -Continue home Gabapentin 300 mg po Daily  Anxiety and Depression -Continue home as needed hydroxyzine and nightly Ativan  Hyperbilirubinemia -Mild and likely reactive -Patient's T Bili went from 0.9 -> 1.5 (Indirect was 1.2 and Direct was 0.3) -Continue to Monitor and Trend -Repeat CMP in the AM   DVT prophylaxis: SCDs Code Status: FULL CODE  Family Communication: Discussed with Wife at bedside  Disposition Plan: Anticipating discharging home in next 24 hours if pain is controlled  Status is: Observation  The patient remains OBS appropriate and will d/c before 2 midnights.  Dispo: The patient is from: Home              Anticipated d/c is to: Home              Patient currently is not medically stable to d/c.   Difficult to place patient No  Consultants:  None   Procedures: As Above  Antimicrobials:  Anti-infectives (From admission, onward)   None        Subjective: Seen and examined at bedside and states that he is feeling well.  Ambulated with therapy and did well with them but still complain of significant pain that was intractable and he was in  tears.  No lightheadedness or dizziness.  No other concerns or complaints at this time.  Objective: Vitals:   10/30/20 1230 10/30/20 1245 10/30/20 1300 10/30/20 1459  BP: 139/82 139/80 (!) 148/82 (!) 165/94  Pulse: 73 82 70 75  Resp:   16 16  Temp:    98.6 F (37 C)  TempSrc:    Oral  SpO2: 92% 95% 94% 93%  Weight:      Height:       No intake or output data in the 24 hours ending 10/30/20 1522 Filed Weights   10/29/20 1508  Weight: 91.6 kg   Examination: Physical Exam:  Constitutional: WN/WD overweight chronically ill-appearing Caucasian male currently in mild distress complaining of some pain Eyes: Lids and conjunctivae normal, sclerae anicteric  ENMT: External Ears, Nose appear normal. Grossly normal hearing.  Neck: Appears normal, supple, no cervical masses, normal ROM, no appreciable thyromegaly; no JVD Respiratory: Diminished to auscultation bilaterally, no wheezing, rales, rhonchi or crackles. Normal respiratory effort and patient is not tachypenic. No accessory muscle use.  Unlabored breathing but  is not taking a deep breath all the way secondary to pain Cardiovascular: RRR, no murmurs / rubs / gallops. S1 and S2 auscultated.  Very little extremity edema Abdomen: Soft, non-tender, distended secondary body habitus. Bowel sounds positive.  GU: Deferred. Musculoskeletal: No clubbing / cyanosis of digits/nails. No joint deformity upper and lower extremities.  Skin: No rashes, lesions, ulcers on limited skin evaluation. No induration; Warm and dry.  Neurologic: CN 2-12 grossly intact with no focal deficits. Romberg sign and cerebellar reflexes not assessed.  Psychiatric: Normal judgment and insight. Alert and oriented x 3.  Anxious and tearful mood and appropriate affect.   Data Reviewed: I have personally reviewed following labs and imaging studies  CBC: Recent Labs  Lab 10/29/20 1530 10/30/20 0751  WBC 6.5 5.6  NEUTROABS 4.8  --   HGB 13.5 13.6  HCT 40.6 40.5  MCV  95.8 94.6  PLT 215 203   Basic Metabolic Panel: Recent Labs  Lab 10/29/20 1530 10/30/20 0751  NA 134* 134*  K 4.5 4.6  CL 100 100  CO2 22 25  GLUCOSE 149* 125*  BUN 16 18  CREATININE 1.07 0.97  CALCIUM 9.0 8.9  MG 2.3  --   PHOS 3.0  --    GFR: Estimated Creatinine Clearance: 75.3 mL/min (by C-G formula based on SCr of 0.97 mg/dL). Liver Function Tests: Recent Labs  Lab 10/29/20 1530 10/30/20 0751  AST 46* 39  ALT 38 34  ALKPHOS 93 90  BILITOT 0.9 1.5*  PROT 7.1 7.1  ALBUMIN 3.9 3.9   No results for input(s): LIPASE, AMYLASE in the last 168 hours. No results for input(s): AMMONIA in the last 168 hours. Coagulation Profile: No results for input(s): INR, PROTIME in the last 168 hours. Cardiac Enzymes: No results for input(s): CKTOTAL, CKMB, CKMBINDEX, TROPONINI in the last 168 hours. BNP (last 3 results) No results for input(s): PROBNP in the last 8760 hours. HbA1C: No results for input(s): HGBA1C in the last 72 hours. CBG: No results for input(s): GLUCAP in the last 168 hours. Lipid Profile: No results for input(s): CHOL, HDL, LDLCALC, TRIG, CHOLHDL, LDLDIRECT in the last 72 hours. Thyroid Function Tests: No results for input(s): TSH, T4TOTAL, FREET4, T3FREE, THYROIDAB in the last 72 hours. Anemia Panel: No results for input(s): VITAMINB12, FOLATE, FERRITIN, TIBC, IRON, RETICCTPCT in the last 72 hours. Sepsis Labs: No results for input(s): PROCALCITON, LATICACIDVEN in the last 168 hours.  Recent Results (from the past 240 hour(s))  SARS CORONAVIRUS 2 (TAT 6-24 HRS) Nasopharyngeal Nasopharyngeal Swab     Status: None   Collection Time: 10/29/20  6:37 PM   Specimen: Nasopharyngeal Swab  Result Value Ref Range Status   SARS Coronavirus 2 NEGATIVE NEGATIVE Final    Comment: (NOTE) SARS-CoV-2 target nucleic acids are NOT DETECTED.  The SARS-CoV-2 RNA is generally detectable in upper and lower respiratory specimens during the acute phase of infection.  Negative results do not preclude SARS-CoV-2 infection, do not rule out co-infections with other pathogens, and should not be used as the sole basis for treatment or other patient management decisions. Negative results must be combined with clinical observations, patient history, and epidemiological information. The expected result is Negative.  Fact Sheet for Patients: SugarRoll.be  Fact Sheet for Healthcare Providers: https://www.woods-mathews.com/  This test is not yet approved or cleared by the [place] FDA and  has been authorized for detection and/or diagnosis of SARS-CoV-2 by FDA under an Emergency Use Authorization (EUA). This EUA will remain  in effect (meaning this test can be used) for the duration of the COVID-19 declaration under Se ction 564(b)(1) of the Act, 21 U.S.C. section 360bbb-3(b)(1), unless the authorization is terminated or revoked sooner.  Performed at [facility], [place] [address] [place], [place] [address]     RN Pressure Injury Documentation:     Estimated body mass index is 25.94 kg/m as calculated from the following:   Height as of this encounter: 6\' 2"  (1.88 m).   Weight as of this encounter: 91.6 kg.  Malnutrition Type:   Malnutrition Characteristics:   Nutrition Interventions:    Radiology Studies: DG Ribs Unilateral W/Chest Right  Result Date: 10/29/2020 CLINICAL DATA:  Intoxicated, fell, pain EXAM: RIGHT RIBS AND CHEST - 3+ VIEW COMPARISON:  10/08/2019 FINDINGS: Frontal view of the chest as well as frontal and oblique views of the right thoracic cage are obtained. The cardiac silhouette is unremarkable. No airspace disease, effusion, or pneumothorax. There are minimally displaced right posterolateral seventh through ninth rib fractures. No other acute bony abnormalities. IMPRESSION: 1. Minimally displaced right posterolateral seventh through ninth rib fractures. 2. No effusion or  pneumothorax. Electronically Signed   By: Randa Ngo M.D.   On: 10/29/2020 16:15   DG Elbow Complete Right  Result Date: 10/29/2020 CLINICAL DATA:  Intoxicated, fell, right elbow pain EXAM: RIGHT ELBOW - COMPLETE 3+ VIEW COMPARISON:  None. FINDINGS: Frontal, bilateral oblique, lateral views of the right elbow are obtained. No fracture, subluxation, or dislocation. Joint spaces are well preserved. Soft tissues are normal. No joint effusion. IMPRESSION: 1. Unremarkable right elbow. Electronically Signed   By: Randa Ngo M.D.   On: 10/29/2020 16:13   CT Head Wo Contrast  Result Date: 10/29/2020 CLINICAL DATA:  Fall. EXAM: CT HEAD WITHOUT CONTRAST TECHNIQUE: Contiguous axial images were obtained from the base of the skull through the vertex without intravenous contrast. COMPARISON:  12/01/2017 FINDINGS: Brain: Generalized atrophy. No sign of old or acute focal infarction, mass lesion, hemorrhage, hydrocephalus or extra-axial collection. Vascular: There is atherosclerotic calcification of the major vessels at the base of the brain. Skull: Normal Sinuses/Orbits: Clear/normal Other: None IMPRESSION: No acute or traumatic finding. Generalized atrophy. Atherosclerotic calcification of the major vessels at the base of the brain. Electronically Signed   By: Nelson Chimes M.D.   On: 10/29/2020 16:41   CT Cervical Spine Wo Contrast  Result Date: 10/29/2020 CLINICAL DATA:  Fall today with trauma to the head and neck. EXAM: CT CERVICAL SPINE WITHOUT CONTRAST TECHNIQUE: Multidetector CT imaging of the cervical spine was performed without intravenous contrast. Multiplanar CT image reconstructions were also generated. COMPARISON:  12/01/2017 FINDINGS: Alignment: Normal Skull base and vertebrae: No fracture or primary bone lesion. Soft tissues and spinal canal: Negative Disc levels: Degenerative spondylosis at C5-6 with right foraminal encroachment by osteophytes. Facet osteoarthritis on the right at C3-4 and C7-T1.  Upper chest: Negative Other: None IMPRESSION: No acute or traumatic finding. Degenerative spondylosis at C5-6 with right foraminal encroachment by osteophytes. Facet osteoarthritis on the right at C3-4 and C7-T1. Electronically Signed   By: Nelson Chimes M.D.   On: 10/29/2020 16:43   Scheduled Meds:  alfuzosin  10 mg Oral Daily   carvedilol  3.125 mg Oral BID WC   ezetimibe  10 mg Oral QHS   finasteride  5 mg Oral QODAY   folic acid  1 mg Oral Daily   gabapentin  300 mg Oral Daily   lidocaine  1 patch Transdermal Q24H  LORazepam  1 mg Oral QHS   metFORMIN  500 mg Oral BID WC   multivitamin with minerals  1 tablet Oral Daily   rosuvastatin  5 mg Oral QODAY   sodium chloride flush  3 mL Intravenous Q12H   thiamine  100 mg Oral Daily   Or   thiamine  100 mg Intravenous Daily   Continuous Infusions:  sodium chloride 75 mL/hr at 10/30/20 1000    LOS: 0 days   Kerney Elbe, DO [facility] PAGER is on AMION  If 7PM-7AM, please contact night-coverage [email or web address]

## 2020-10-30 NOTE — ED Notes (Signed)
Patient received breakfast tray 

## 2020-10-30 NOTE — ED Notes (Signed)
PT at bedside.

## 2020-10-30 NOTE — ED Notes (Signed)
Ambulated to RR and back without assistance. Oxygen saturation 90-92% RA.

## 2020-10-30 NOTE — Evaluation (Signed)
Physical Therapy Evaluation Patient Details Name: Eduardo Watts. MRN: [id number] DOB: 11-06-1945 Today's Date: 10/30/2020   History of Present Illness  Pt is 75 yo male admitted to ED on 10/29/20 after fall while intoxicated with R ribs fx 7-9. Pt with medical history significant of alcohol use, CAD, GERD, hyperlipidemia, nocturnal hypoxia, neuropathy, anxiety, depression  Clinical Impression  Pt admitted with above diagnosis. Pt presents with decreased mobility, balance, safety awareness, and strength associated with pain from rib fx.  Pt also with hx of ETOH abuse and peripheral neuropathy contributing to decreased balance.  From PT perspective demonstrates mobility necessary to return home, but will benefit from PT to advance while hospitalized.  Pt was educated on PT recommendations of RW use and safety, also incentive spirometer use and deep breathing to prevent PNE s/p rib fx, and transfer techniques/positioning to decrease pain. Pt currently with functional limitations due to the deficits listed below (see PT Problem List). Pt will benefit from skilled PT to increase their independence and safety with mobility to allow discharge to the venue listed below.       Follow Up Recommendations No PT follow up;Supervision for mobility/OOB    Equipment Recommendations  Rolling walker with 5" wheels    Recommendations for Other Services       Precautions / Restrictions Precautions Precautions: Fall      Mobility  Bed Mobility Overal bed mobility: Needs Assistance Bed Mobility: Rolling;Sit to Sidelying Rolling: Min assist       Sit to sidelying: Mod assist General bed mobility comments: Educated on log roll technique.  Required assist to get both legs into bed.  Discussed sleeping in recliner due to rib pain    Transfers Overall transfer level: Needs assistance Equipment used: None Transfers: Sit to/from Stand Sit to Stand: Min guard;From elevated surface          General transfer comment: increased time to rise; cues for hand placement  Ambulation/Gait Ambulation/Gait assistance: Min guard Gait Distance (Feet): 75 Feet Assistive device: None Gait Pattern/deviations: Step-to pattern;Decreased stride length;Wide base of support;Shuffle Gait velocity: decreased   General Gait Details: Pt with mild unsteadiness but not wanting to use RW due to pain R ribs up into arm.  Tried to encourage walker for stability/preventing falls.  Also, pt reports neuropathy bil feet and did not wear his shoes.  Stairs            Wheelchair Mobility    Modified Rankin (Stroke Patients Only)       Balance Overall balance assessment: Needs assistance Sitting-balance support: No upper extremity supported Sitting balance-Leahy Scale: Good     Standing balance support: No upper extremity supported Standing balance-Leahy Scale: Fair                               Pertinent Vitals/Pain Pain Assessment: Faces Faces Pain Scale: Hurts even more Pain Location: R ribs Pain Descriptors / Indicators: Sharp Pain Intervention(s): Limited activity within patient's tolerance;Monitored during session;RN gave pain meds during session;Utilized relaxation techniques;Relaxation;Repositioned    Home Living Family/patient expects to be discharged to:: Private residence Living Arrangements: Spouse/significant other Available Help at Discharge: Family;Available 24 hours/day Type of Home: House Home Access: Level entry     Home Layout: Two level;Able to live on main level with bedroom/bathroom Home Equipment: Shower seat;Cane - single point      Prior Function Level of Independence: Independent  Comments: Reports falls while intoxicated.     Hand Dominance        Extremity/Trunk Assessment   Upper Extremity Assessment Upper Extremity Assessment: Defer to OT evaluation    Lower Extremity Assessment Lower Extremity Assessment: Overall  WFL for tasks assessed    Cervical / Trunk Assessment Cervical / Trunk Assessment: Other exceptions Cervical / Trunk Exceptions: Limited trunk movement due to pain.  Noted pt with decreased movement R cheek/mouth muscles - reports chronic from prior surgery.  Communication   Communication: No difficulties  Cognition Arousal/Alertness: Awake/alert Behavior During Therapy: WFL for tasks assessed/performed Overall Cognitive Status: Within Functional Limits for tasks assessed                                        General Comments General comments (skin integrity, edema, etc.): VSS.  Educated pt on transfer techniques for decreased pain with rib fx.  Also, recommended sleeping in recliner for pain control.  Recommended RW for safety.  Gave pt incentive spirometer and educated on use - pt performed x 3 correctly from 1000-1500 mL.  Encouraged deep breathing and IS use 10xhr to prevent PNE.    Exercises     Assessment/Plan    PT Assessment Patient needs continued PT services  PT Problem List Decreased strength;Decreased mobility;Decreased safety awareness;Decreased range of motion;Decreased activity tolerance;Cardiopulmonary status limiting activity;Decreased balance;Decreased knowledge of use of DME;Pain       PT Treatment Interventions DME instruction;Therapeutic exercise;Gait training;Balance training;Stair training;Functional mobility training;Therapeutic activities;Patient/family education    PT Goals (Current goals can be found in the Care Plan section)  Acute Rehab PT Goals Patient Stated Goal: return home; decrease pain PT Goal Formulation: With patient Time For Goal Achievement: 11/13/20 Potential to Achieve Goals: Good    Frequency Min 3X/week   Barriers to discharge        Co-evaluation PT/OT/SLP Co-Evaluation/Treatment: Yes Reason for Co-Treatment: For patient/therapist safety (s/p fall, eval in ED) PT goals addressed during session: Mobility/safety  with mobility;Proper use of DME;Balance OT goals addressed during session: ADL's and self-care       AM-PAC PT "6 Clicks" Mobility  Outcome Measure Help needed turning from your back to your side while in a flat bed without using bedrails?: A Little Help needed moving from lying on your back to sitting on the side of a flat bed without using bedrails?: A Little Help needed moving to and from a bed to a chair (including a wheelchair)?: A Little Help needed standing up from a chair using your arms (e.g., wheelchair or bedside chair)?: A Little Help needed to walk in hospital room?: A Little Help needed climbing 3-5 steps with a railing? : A Little 6 Click Score: 18    End of Session Equipment Utilized During Treatment: Gait belt Activity Tolerance: Patient tolerated treatment well Patient left: in bed;with chair alarm set;with call bell/phone within reach (stretcher ED with chair alarm) Nurse Communication: Mobility status PT Visit Diagnosis: Unsteadiness on feet (R26.81);History of falling (Z91.81);Pain Pain - Right/Left: Right Pain - part of body:  (ribs)    Time: 1000-1030 PT Time Calculation (min) (ACUTE ONLY): 30 min   Charges:   PT Evaluation $PT Eval Moderate Complexity: 1 Mod          .edhb   Anthea Udovich H Jeanni Allshouse 10/30/2020, 10:44 AM

## 2020-10-31 ENCOUNTER — Observation Stay (HOSPITAL_COMMUNITY): Payer: Medicare Other

## 2020-10-31 DIAGNOSIS — S2241XA Multiple fractures of ribs, right side, initial encounter for closed fracture: Secondary | ICD-10-CM | POA: Diagnosis not present

## 2020-10-31 DIAGNOSIS — Z7289 Other problems related to lifestyle: Secondary | ICD-10-CM | POA: Diagnosis not present

## 2020-10-31 DIAGNOSIS — G4734 Idiopathic sleep related nonobstructive alveolar hypoventilation: Secondary | ICD-10-CM | POA: Diagnosis not present

## 2020-10-31 DIAGNOSIS — I2510 Atherosclerotic heart disease of native coronary artery without angina pectoris: Secondary | ICD-10-CM | POA: Diagnosis not present

## 2020-10-31 DIAGNOSIS — E119 Type 2 diabetes mellitus without complications: Secondary | ICD-10-CM

## 2020-10-31 LAB — CBC WITH DIFFERENTIAL/PLATELET
Abs Immature Granulocytes: 0.02 10*3/uL (ref 0.00–0.07)
Basophils Absolute: 0 10*3/uL (ref 0.0–0.1)
Basophils Relative: 1 %
Eosinophils Absolute: 0.2 10*3/uL (ref 0.0–0.5)
Eosinophils Relative: 4 %
HCT: 42.6 % (ref 39.0–52.0)
Hemoglobin: 13.8 g/dL (ref 13.0–17.0)
Immature Granulocytes: 0 %
Lymphocytes Relative: 13 %
Lymphs Abs: 0.8 10*3/uL (ref 0.7–4.0)
MCH: 31.6 pg (ref 26.0–34.0)
MCHC: 32.4 g/dL (ref 30.0–36.0)
MCV: 97.5 fL (ref 80.0–100.0)
Monocytes Absolute: 0.7 10*3/uL (ref 0.1–1.0)
Monocytes Relative: 11 %
Neutro Abs: 4.5 10*3/uL (ref 1.7–7.7)
Neutrophils Relative %: 71 %
Platelets: 190 10*3/uL (ref 150–400)
RBC: 4.37 MIL/uL (ref 4.22–5.81)
RDW: 12.1 % (ref 11.5–15.5)
WBC: 6.3 10*3/uL (ref 4.0–10.5)
nRBC: 0 % (ref 0.0–0.2)

## 2020-10-31 LAB — COMPREHENSIVE METABOLIC PANEL
ALT: 27 U/L (ref 0–44)
AST: 27 U/L (ref 15–41)
Albumin: 3.7 g/dL (ref 3.5–5.0)
Alkaline Phosphatase: 86 U/L (ref 38–126)
Anion gap: 11 (ref 5–15)
BUN: 19 mg/dL (ref 8–23)
CO2: 21 mmol/L — ABNORMAL LOW (ref 22–32)
Calcium: 8.8 mg/dL — ABNORMAL LOW (ref 8.9–10.3)
Chloride: 101 mmol/L (ref 98–111)
Creatinine, Ser: 1.02 mg/dL (ref 0.61–1.24)
GFR, Estimated: >60 mL/min (ref >60)
Glucose, Bld: 112 mg/dL — ABNORMAL HIGH (ref 70–99)
Potassium: 4.5 mmol/L (ref 3.5–5.1)
Sodium: 133 mmol/L — ABNORMAL LOW (ref 135–145)
Total Bilirubin: 0.7 mg/dL (ref 0.3–1.2)
Total Protein: 6.7 g/dL (ref 6.5–8.1)

## 2020-10-31 LAB — MAGNESIUM: Magnesium: 2.4 mg/dL (ref 1.7–2.4)

## 2020-10-31 LAB — HEMOGLOBIN A1C
Hgb A1c MFr Bld: 6.7 % — ABNORMAL HIGH (ref 4.8–5.6)
Mean Plasma Glucose: 145.59 mg/dL

## 2020-10-31 LAB — TSH: TSH: 4.083 u[IU]/mL (ref 0.350–4.500)

## 2020-10-31 LAB — PHOSPHORUS: Phosphorus: 3.1 mg/dL (ref 2.5–4.6)

## 2020-10-31 MED ORDER — FAMOTIDINE IN NACL 20-0.9 MG/50ML-% IV SOLN
20.0000 mg | Freq: Once | INTRAVENOUS | Status: AC
  Administered 2020-10-31: 20 mg via INTRAVENOUS
  Filled 2020-10-31: qty 50

## 2020-10-31 MED ORDER — TRAMADOL HCL 50 MG PO TABS: 50.0000 mg | ORAL_TABLET | Freq: Two times a day (BID) | ORAL | 0 refills | Status: AC | PRN

## 2020-10-31 MED ORDER — KETOROLAC TROMETHAMINE 30 MG/ML IJ SOLN
30.0000 mg | Freq: Once | INTRAMUSCULAR | Status: AC
  Administered 2020-10-31: 30 mg via INTRAVENOUS
  Filled 2020-10-31: qty 1

## 2020-10-31 MED ORDER — HYDROCORTISONE 1 % EX CREA
TOPICAL_CREAM | Freq: Two times a day (BID) | CUTANEOUS | Status: DC
  Administered 2020-10-31: 1 via TOPICAL
  Filled 2020-10-31: qty 28

## 2020-10-31 MED ORDER — DIPHENHYDRAMINE HCL 50 MG/ML IJ SOLN
25.0000 mg | Freq: Once | INTRAMUSCULAR | Status: AC
  Administered 2020-10-31: 25 mg via INTRAVENOUS
  Filled 2020-10-31: qty 1

## 2020-10-31 MED ORDER — HYDROCORTISONE 1 % EX CREA: TOPICAL_CREAM | Freq: Two times a day (BID) | CUTANEOUS | 0 refills | Status: DC

## 2020-10-31 NOTE — TOC Initial Note (Signed)
Transition of Care (TOC) - Initial/Assessment Note    Patient Details  Name: Eduardo Watts. MRN: [id number] Date of Birth: 02/11/1946  Transition of Care [facility]) CM/SW Contact:    Joaquin Courts, RN Phone Number: 10/31/2020, 11:48 AM  Clinical Narrative:                 Rip Harbour given referral for rolling walker, to be delivered to bedside for home use.  Expected Discharge Plan: Home/Self Care Barriers to Discharge: No Barriers Identified   Patient Goals and CMS Choice Patient states their goals for this hospitalization and ongoing recovery are:: to go home      Expected Discharge Plan and Services Expected Discharge Plan: Home/Self Care   Discharge Planning Services: CM Consult   Living arrangements for the past 2 months: Single Family Home Expected Discharge Date: 10/31/20               DME Arranged: Gilford Rile rolling DME Agency: Other - Comment Celesta Aver) Date DME Agency Contacted: 10/31/20 Time DME Agency Contacted: [phone] Representative spoke with at DME Agency: Brenton Grills            Prior Living Arrangements/Services Living arrangements for the past 2 months: [place] with:: Spouse Patient language and need for interpreter reviewed:: Yes Do you feel safe going back to the place where you live?: Yes      Need for Family Participation in Patient Care: Yes (Comment) Care giver support system in place?: Yes (comment)   Criminal Activity/Legal Involvement Pertinent to Current Situation/Hospitalization: No - Comment as needed  Activities of Daily Living Home Assistive Devices/Equipment: Eyeglasses,Oxygen ADL Screening (condition at time of admission) Patient's cognitive ability adequate to safely complete daily activities?: Yes Is the patient deaf or have difficulty hearing?: Yes Does the patient have difficulty seeing, even when wearing glasses/contacts?: No Does the patient have difficulty concentrating, remembering, or making  decisions?: No Patient able to express need for assistance with ADLs?: Yes Does the patient have difficulty dressing or bathing?: No Independently performs ADLs?: Yes (appropriate for developmental age) Does the patient have difficulty walking or climbing stairs?: Yes Weakness of Legs: Both Weakness of Arms/Hands: None  Permission Sought/Granted                  Emotional Assessment           Psych Involvement: No (comment)  Admission diagnosis:  Prediabetes [P37.90] Alcoholic intoxication without complication ([place]) [W40.973] Skin tear of right elbow without complication, initial encounter [S51.011A] Closed fracture of multiple ribs of right side, initial encounter [S22.41XA] Multiple fractures of ribs, right side, initial encounter for closed fracture [S22.41XA] Patient Active Problem List   Diagnosis Date Noted  . Multiple fractures of ribs, right side, initial encounter for closed fracture 10/29/2020  . Peripheral polyneuropathy 04/15/2019  . GERD (gastroesophageal reflux disease) 10/08/2018  . Prediabetes 11/08/2016  . Alcohol use 07/13/2015  . Dyspnea on exertion 04/02/2015  . Insomnia 04/02/2015  . Palpitations 01/27/2015  . Nocturnal hypoxia 01/27/2015  . Right-sided chest wall pain 11/26/2014  . Hypotension 09/08/2014  . Cough 06/09/2014  . Coronary atherosclerosis of native coronary artery 05/06/2014  . Nonspecific abnormal unspecified cardiovascular function study 04/21/2014  . Mixed hyperlipidemia 07/28/2013  . Nonspecific abnormal electrocardiogram (ECG) (EKG) 07/28/2013   PCP:  [name], MD Pharmacy:   [facility] [address] [place], [place]. [place] [address] Phone: [phone] Fax: [phone]  [place], [place] - [address] [place] [place] [address] Phone: [phone] Fax: [phone]     Social Determinants of  Health (SDOH) Interventions    Readmission Risk Interventions No flowsheet data found.

## 2020-10-31 NOTE — Discharge Summary (Signed)
Physician Discharge Summary  Eduardo Watts. UMP:[id number] DOB: 1946/06/09 DOA: 10/29/2020  PCP: [name], MD  Admit date: 10/29/2020 Discharge date: 10/31/2020  Admitted From: Home Disposition: Home  Recommendations for Outpatient Follow-up:  1. Follow up with PCP in 1-2 weeks 2. Follow up with Pulmonary within 1 week 3. Follow up with Dermatology within 1 week 4. Follow up with Cardiology within 1 week  5. Please obtain CMP/CBC, Mag, Phos in one week 6. Please follow up on the following pending results:  Home Health: No Equipment/Devices: Conservation officer, nature with 5" Wheels   Discharge Condition: Stable CODE STATUS: FULL CODE Diet recommendation: Heart Healthy Carb Modified Diet  Brief/Interim Summary: The patient is a 75 year old overweight Caucasian male with a past medical history significant for but not limited to alcohol use, CAD, GERD, hyperlipidemia, history of nocturnal hypoxia, neuropathy, anxiety and depression as well as other comorbidities who presented after a fall at home.  Patient had fallen several times in the setting of his intoxication after drinking almost 2 bottles of wine yesterday.  He reportedly drinks 1-2 bottles of wine per night and does not think he is gone for any significant amount of time without drinking for years.  Because he had fallen multiple times and hurt himself EMS was called by his wife after the falls.  Is uncertain if he hit his head or loss consciousness and he is not on any blood thinners.  He reported pain in his right ribs and his right elbow but denies any other complaints.  Initially we brought to the ED vital signs were significant for O2 sats around 89 to 90% which appeared to be somewhat chronic.  Labs showed hyponatremia and elevated glucose level.  Ethanol level was 214.  Respiratory virus panel and Covid testing were negative.  He had multiple imaging studies done including a CT of the head which showed no acute intracranial  abnormalities but did show atherosclerotic disease of the brain.  Patient also underwent a CT scan C-spine which showed spondylosis at C5-C6 and facet osteoarthritis at C3-C4 and C7-T1.  Chest x-ray was done and showed minimally displaced rib fractures from the seventh of the ninth right ribs.  He was admitted and he steadily improved but his limiting factor is intractable pain now secondary to his rib fractures.  He OT evaluated and recommended supervision but no home health services and recommended a rolling walker.  Patient ambulated and did not desaturate.  He is going to be discharged home but patient was in too much pain to be discharged at this time and will continue monitor overnight and treat his intractable rib pain.  He was given a lidocaine patch and scheduled ketorolac.  He received adequate pain control and this morning he appeared comfortable and he does ambulate with the nurse.  He received a dose of IV ketorolac prior to discharge and will send him on low-dose tramadol for discharge and a lidocaine patch.  Patient developed blisters and a small rash on his back and prior to discharge he was given IV Benadryl and famotidine and topical hydrocortisone.  His advised to follow-up with his dermatologist if it continue to worsen.  He is stable to be discharged at this time and will develop with PCP, cardiology, dermatology as well as pulmonary in outpatient setting and continue to do his incentive deformity to aerate his lungs.  Discharge Diagnoses:  Principal Problem:   Multiple fractures of ribs, right side, initial encounter for closed fracture Active  Problems:   Mixed hyperlipidemia   Coronary atherosclerosis of native coronary artery   Right-sided chest wall pain   Nocturnal hypoxia   Alcohol use   Prediabetes   Peripheral polyneuropathy  Recurrent Falls Minimally Displaced Multiple rib fractures on Right  Intractable Pain 2/2 Rib Displacement  -Patient with falls at home secondary  to intoxication. -Found to have mildly displaced rib fractures in right ribs 7 through 9 -Also with saturations in the high 80s to low 90s on room air in the ED. This is somewhat chronic to him but is typically in the low 90s during the day with nocturnal hypoxia. -Monitored overnight for any development of complications considering his age, history of nocturnal hypoxia, 3 consecutive rib fractures. -Continue to Monitor on telemetry -Continued oxygen therapy and Weaned to Room Air -Pain control as needed; Continues to Have Intractable Pain so added Lidocaine Patch and Ketorolac; C/w Acetaminophen 650 mg po q6hprn, Hydrocodone-Acetaminophen 1 tab po q4hprn Moderate Pain, and 0.5 mg IV Hydromorphone q4hprn Sever Pain -Pulmonary Toilet, Incentive Spirometry, and Flutter Valve -PT/OT recommending no Follow up but do recommend RW with 5" Wheels -We will discharge home with tramadol 50 mg every 12 as needed and a lidocaine patch and follow-up with his PCP for further management  Nocturnal Hypoxia -Has 3 history of this with possible mild obstruction. Does not take inhalers at home -Uses oxygen at night but not during the day due to nocturnal desaturations -Ambulatory Home O2 Screen Done and did not desaturate -SpO2: 93 % O2 Flow Rate (L/min): 2 L/min; Now Weaned off of O2 Altogether  -Continue oxygen via nasal cannula as above -Follow-up with pulmonary as an outpatient as he appeared comfortable on room air  Alcohol use -History of alcohol use drinking about 2 bottles of wine per day. Was Intoxicated on presentation with alcohol level of 214. -CIWAwithout Ativan as unlikely to start withdrawal overnight given positive alcohol level on admission -Will need Folic Acid, MVI+Minerals, and Thiamine at discharge and advised to cut down on his alcohol consumption  CAD Hyperlipidemia -Continue home Carvedilol 3.125 mg po BID, Ezetimibe 10 mg po qHS, and Rosuvastatin 5 mg po Daily  -No Chest Pain  but complains of Rib Pain  HTN -Patient's BP elevated in the setting of Pain -C/w with Carvedilol 3.125 mg po BID -Will add IV Hydralazine 5 mg q6hprn for SBP>160 or DBP>90 -Last blood pressure reading was 141/81  BPH -Continue home Alfuzosin 10 mg po Daily and Finasteride 5 mg po EOD  Hyponatremia -In the setting of alcoholism -Patient sodium is stable at 133 -Continue to monitor and trend repeat CMP within 1 weekDiabetes mellitus type 2 new onset -Continue Metformin 500 mg po BID -Hemoglobin A1c was 6.7  -Dietary counseling given and will need to follow-up with his PCP for further blood sugar management and adjustment  Neuropathy -Continue home Gabapentin 300 mg po Daily  Anxiety and Depression -Continue home as needed hydroxyzine and nightly Ativan  Hyperbilirubinemia -Mild and likely reactive -Patient's T Bili went from 0.9 -> 1.5 (Indirect was 1.2 and Direct was 0.3) and is now improved to 0.7 -Continue to Monitor and Trend -Repeat CMP in the AM   Back rash and blisters -Developed acutely and could be medication induced -Hydrocortisone cream, another dose of Benadryl given itching and famotidine -She gets skin biopsy every 3 months with a dermatologist and will need to be followed up with an outpatient and if continues to worsen may need some systemic steroids -Concern for  porphyria cutanea tarda however is an area that is not exposed to sunlight but he is an alcoholic. -Advised to call his dermatologist annual follow-up within 1 week.  Discharge Instructions  Discharge Instructions    Call MD for:  difficulty breathing, headache or visual disturbances   Complete by: As directed    Call MD for:  extreme fatigue   Complete by: As directed    Call MD for:  hives   Complete by: As directed    Call MD for:  persistant dizziness or light-headedness   Complete by: As directed    Call MD for:  persistant nausea and vomiting   Complete by: As directed    Call MD  for:  redness, tenderness, or signs of infection (pain, swelling, redness, odor or green/yellow discharge around incision site)   Complete by: As directed    Call MD for:  severe uncontrolled pain   Complete by: As directed    Call MD for:  temperature >100.4   Complete by: As directed    Diet - low sodium heart healthy   Complete by: As directed    Diet Carb Modified   Complete by: As directed    Discharge instructions   Complete by: As directed    You were cared for by a hospitalist during your hospital stay. If you have any questions about your discharge medications or the care you received while you were in the hospital after you are discharged, you can call the unit and ask to speak with the hospitalist on call if the hospitalist that took care of you is not available. Once you are discharged, your primary care physician will handle any further medical issues. Please note that NO REFILLS for any discharge medications will be authorized once you are discharged, as it is imperative that you return to your primary care physician (or establish a relationship with a primary care physician if you do not have one) for your aftercare needs so that they can reassess your need for medications and monitor your lab values.  Follow up with PCP, Cardiology, and Pulmonary within 1-2 weeks Take all medications as prescribed and cut down on the Alcohol. If symptoms change or worsen please return to the ED for evaluation   Increase activity slowly   Complete by: As directed      Allergies as of 10/31/2020      Reactions   Zocor [simvastatin] Other (See Comments)   Muscle aches   Trazodone And Nefazodone Other (See Comments)   "crazy"      Medication List    STOP taking these medications   Alpha-Lipoic Acid 600 MG Caps   amitriptyline 25 MG tablet Commonly known as: ELAVIL     TAKE these medications   acetaminophen 325 MG tablet Commonly known as: TYLENOL Take 2 tablets (650 mg total) by  mouth every 6 (six) hours as needed for mild pain (or Fever >/= 101).   alfuzosin 10 MG 24 hr tablet Commonly known as: UROXATRAL Take 10 mg by mouth daily.   B COMPLEX PO Take 1 tablet by mouth once a week.   CALCIUM PO Take 1 tablet by mouth at bedtime.   carvedilol 3.125 MG tablet Commonly known as: COREG Take 3.125 mg by mouth at bedtime.   cyclobenzaprine 10 MG tablet Commonly known as: FLEXERIL TAKE 1 TABLET BY MOUTH AT BEDTIME   ezetimibe 10 MG tablet Commonly known as: ZETIA Take 1 tablet (10 mg total) by mouth daily.  What changed: when to take this   finasteride 5 MG tablet Commonly known as: PROSCAR Take 5 mg by mouth at bedtime. Take 1 tablet by mouth every other night   Fish Oil 1000 MG Caps Take 1 capsule by mouth once a week.   fluticasone 50 MCG/ACT nasal spray Commonly known as: FLONASE Place 1 spray into both nostrils daily.   folic acid 1 MG tablet Commonly known as: FOLVITE Take 1 tablet (1 mg total) by mouth daily.   gabapentin 300 MG capsule Commonly known as: NEURONTIN Take 600 mg by mouth at bedtime.   hydrOXYzine 10 MG tablet Commonly known as: ATARAX/VISTARIL TAKE 2 TABLETS BY MOUTH THREE TIMES DAILY AS NEEDED FOR ANXIETY What changed:   how much to take  how to take this  when to take this  additional instructions   lidocaine 5 % Commonly known as: LIDODERM Place 1 patch onto the skin daily. Remove & Discard patch within 12 hours or as directed by MD   LORazepam 0.5 MG tablet Commonly known as: ATIVAN Take 2 tablets (1 mg total) by mouth at bedtime.   Magnesium 100 MG Caps Take 1 capsule by mouth at bedtime.   metFORMIN 500 MG tablet Commonly known as: GLUCOPHAGE Take 500 mg by mouth every evening.   multivitamin with minerals Tabs tablet Take 1 tablet by mouth daily.   polyethylene glycol 17 g packet Commonly known as: MIRALAX / GLYCOLAX Take 17 g by mouth daily as needed for mild constipation.   rosuvastatin 5  MG tablet Commonly known as: CRESTOR Take 5 mg by mouth 2 (two) times a week.   sildenafil 20 MG tablet Commonly known as: REVATIO Take 20 mg by mouth daily as needed (ed).   thiamine 100 MG tablet Take 1 tablet (100 mg total) by mouth daily.   traMADol 50 MG tablet Commonly known as: Ultram Take 1 tablet (50 mg total) by mouth every 12 (twelve) hours as needed for moderate pain.   Vitamin D3 125 MCG (5000 UT) Caps Take 1 capsule by mouth at bedtime.   zinc sulfate 220 (50 Zn) MG capsule Take 220 mg by mouth every evening.            Durable Medical Equipment  (From admission, onward)         Start     Ordered   10/30/20 1059  For home use only DME Walker rolling  Once       Question Answer Comment  Walker: With [place] Wheels   Patient needs a walker to treat with the following condition Generalized weakness      10/30/20 1059          Follow-up Information    [name], MD. Call.   Specialty: Family Medicine Why: Follow up within 1-2 Weeks Contact information: [address] [place] [address] [phone]        [name], MD .   Specialties: Cardiology, Radiology, Interventional Cardiology Contact information: [address] [place] [address] [phone]        Deneise Lever, MD. Call.   Specialty: Pulmonary Disease Why: Follow up within 1-2 weeks Contact information: [address] [place] [address] [phone]              Allergies  Allergen Reactions  . Zocor [Simvastatin] Other (See Comments)    Muscle aches  . Trazodone And Nefazodone Other (See Comments)    "  crazy"   Consultations:  None  Procedures/Studies: DG Ribs Unilateral W/Chest Right  Result Date: 10/29/2020 CLINICAL DATA:  Intoxicated, fell, pain EXAM: RIGHT RIBS AND CHEST - 3+ VIEW COMPARISON:  10/08/2019 FINDINGS: Frontal view of the chest as well as frontal and oblique views of the right  thoracic cage are obtained. The cardiac silhouette is unremarkable. No airspace disease, effusion, or pneumothorax. There are minimally displaced right posterolateral seventh through ninth rib fractures. No other acute bony abnormalities. IMPRESSION: 1. Minimally displaced right posterolateral seventh through ninth rib fractures. 2. No effusion or pneumothorax. Electronically Signed   By: Randa Ngo M.D.   On: 10/29/2020 16:15   DG Elbow Complete Right  Result Date: 10/29/2020 CLINICAL DATA:  Intoxicated, fell, right elbow pain EXAM: RIGHT ELBOW - COMPLETE 3+ VIEW COMPARISON:  None. FINDINGS: Frontal, bilateral oblique, lateral views of the right elbow are obtained. No fracture, subluxation, or dislocation. Joint spaces are well preserved. Soft tissues are normal. No joint effusion. IMPRESSION: 1. Unremarkable right elbow. Electronically Signed   By: Randa Ngo M.D.   On: 10/29/2020 16:13   CT Head Wo Contrast  Result Date: 10/29/2020 CLINICAL DATA:  Fall. EXAM: CT HEAD WITHOUT CONTRAST TECHNIQUE: Contiguous axial images were obtained from the base of the skull through the vertex without intravenous contrast. COMPARISON:  12/01/2017 FINDINGS: Brain: Generalized atrophy. No sign of old or acute focal infarction, mass lesion, hemorrhage, hydrocephalus or extra-axial collection. Vascular: There is atherosclerotic calcification of the major vessels at the base of the brain. Skull: Normal Sinuses/Orbits: Clear/normal Other: None IMPRESSION: No acute or traumatic finding. Generalized atrophy. Atherosclerotic calcification of the major vessels at the base of the brain. Electronically Signed   By: Nelson Chimes M.D.   On: 10/29/2020 16:41   CT Cervical Spine Wo Contrast  Result Date: 10/29/2020 CLINICAL DATA:  Fall today with trauma to the head and neck. EXAM: CT CERVICAL SPINE WITHOUT CONTRAST TECHNIQUE: Multidetector CT imaging of the cervical spine was performed without intravenous contrast.  Multiplanar CT image reconstructions were also generated. COMPARISON:  12/01/2017 FINDINGS: Alignment: Normal Skull base and vertebrae: No fracture or primary bone lesion. Soft tissues and spinal canal: Negative Disc levels: Degenerative spondylosis at C5-6 with right foraminal encroachment by osteophytes. Facet osteoarthritis on the right at C3-4 and C7-T1. Upper chest: Negative Other: None IMPRESSION: No acute or traumatic finding. Degenerative spondylosis at C5-6 with right foraminal encroachment by osteophytes. Facet osteoarthritis on the right at C3-4 and C7-T1. Electronically Signed   By: Nelson Chimes M.D.   On: 10/29/2020 16:43   DG CHEST PORT 1 VIEW  Result Date: 10/31/2020 CLINICAL DATA:  Shortness of breath EXAM: PORTABLE CHEST 1 VIEW COMPARISON:  10/29/2020 FINDINGS: Low volume chest with mild atelectatic type opacity at the bases. There is no edema, consolidation, effusion, or pneumothorax. Normal heart size and mediastinal contours. IMPRESSION: Low volume chest with mild atelectasis. Electronically Signed   By: Monte Fantasia M.D.   On: 10/31/2020 04:49     Subjective: Seen And examined at bedside and he was still complaining of some right-sided rib pain but was improved and appeared more comfortable.  Also had a new rash and blisters on his backside which was very minimal.  Will need to follow-up with his dermatologist in outpatient setting.  He is stable to be discharged and pain is adequately controlled prior to discharge and he will be needing to follow-up with his PCP and pulmonologist in the outpatient setting.  Discharge Exam: Vitals:  10/30/20 2307 10/31/20 0416  BP: (!) 142/92 (!) 141/81  Pulse: 75 77  Resp: 16 16  Temp: (!) 97.4 F (36.3 C) 97.8 F (36.6 C)  SpO2: 93% 93%   Vitals:   10/30/20 1459 10/30/20 2051 10/30/20 2307 10/31/20 0416  BP: (!) 165/94 128/83 (!) 142/92 (!) 141/81  Pulse: 75 73 75 77  Resp: 16 16 16 16   Temp: 98.6 F (37 C) 97.8 F (36.6 C) (!)  97.4 F (36.3 C) 97.8 F (36.6 C)  TempSrc: Oral Oral Oral Oral  SpO2: 93% 93% 93% 93%  Weight:      Height:       General: Pt is alert, awake, not in acute distress Cardiovascular: RRR, S1/S2 +, no rubs, no gallops Respiratory: Diminished bilaterally worse on the right compared to left, no wheezing, no rhonchi Abdominal: Soft, NT, ND, bowel sounds + Extremities: no edema, no cyanosis; has a mild rash on his back and has a small blister on his lower backside  The results of significant diagnostics from this hospitalization (including imaging, microbiology, ancillary and laboratory) are listed below for reference.    Microbiology: Recent Results (from the past 240 hour(s))  SARS CORONAVIRUS 2 (TAT 6-24 HRS) Nasopharyngeal Nasopharyngeal Swab     Status: None   Collection Time: 10/29/20  6:37 PM   Specimen: Nasopharyngeal Swab  Result Value Ref Range Status   SARS Coronavirus 2 NEGATIVE NEGATIVE Final    Comment: (NOTE) SARS-CoV-2 target nucleic acids are NOT DETECTED.  The SARS-CoV-2 RNA is generally detectable in upper and lower respiratory specimens during the acute phase of infection. Negative results do not preclude SARS-CoV-2 infection, do not rule out co-infections with other pathogens, and should not be used as the sole basis for treatment or other patient management decisions. Negative results must be combined with clinical observations, patient history, and epidemiological information. The expected result is Negative.  Fact Sheet for Patients: SugarRoll.be  Fact Sheet for Healthcare Providers: https://www.woods-mathews.com/  This test is not yet approved or cleared by the [place] FDA and  has been authorized for detection and/or diagnosis of SARS-CoV-2 by FDA under an Emergency Use Authorization (EUA). This EUA will remain  in effect (meaning this test can be used) for the duration of the COVID-19 declaration under Se  ction 564(b)(1) of the Act, 21 U.S.C. section 360bbb-3(b)(1), unless the authorization is terminated or revoked sooner.  Performed at [facility], [place] [address] [place], [place] [address]     Labs: BNP (last 3 results) No results for input(s): BNP in the last 8760 hours. Basic Metabolic Panel: Recent Labs  Lab 10/29/20 1530 10/30/20 0751 10/31/20 0528  NA 134* 134* 133*  K 4.5 4.6 4.5  CL 100 100 101  CO2 22 25 21*  GLUCOSE 149* 125* 112*  BUN 16 18 19   CREATININE 1.07 0.97 1.02  CALCIUM 9.0 8.9 8.8*  MG 2.3  --  2.4  PHOS 3.0  --  3.1   Liver Function Tests: Recent Labs  Lab 10/29/20 1530 10/30/20 0751 10/31/20 0528  AST 46* 39 27  ALT 38 34 27  ALKPHOS 93 90 86  BILITOT 0.9 1.5* 0.7  PROT 7.1 7.1 6.7  ALBUMIN 3.9 3.9 3.7   No results for input(s): LIPASE, AMYLASE in the last 168 hours. No results for input(s): AMMONIA in the last 168 hours. CBC: Recent Labs  Lab 10/29/20 1530 10/30/20 0751 10/31/20 0528  WBC 6.5 5.6 6.3  NEUTROABS 4.8  --  4.5  HGB 13.5 13.6 13.8  HCT 40.6 40.5 42.6  MCV 95.8 94.6 97.5  PLT 215 193 190   Cardiac Enzymes: No results for input(s): CKTOTAL, CKMB, CKMBINDEX, TROPONINI in the last 168 hours. BNP: Invalid input(s): POCBNP CBG: No results for input(s): GLUCAP in the last 168 hours. D-Dimer No results for input(s): DDIMER in the last 72 hours. Hgb A1c Recent Labs    10/31/20 0528  HGBA1C 6.7*   Lipid Profile No results for input(s): CHOL, HDL, LDLCALC, TRIG, CHOLHDL, LDLDIRECT in the last 72 hours. Thyroid function studies Recent Labs    10/31/20 0528  TSH 4.083   Anemia work up No results for input(s): VITAMINB12, FOLATE, FERRITIN, TIBC, IRON, RETICCTPCT in the last 72 hours. Urinalysis    Component Value Date/Time   COLORURINE YELLOW 07/02/2012 2014   APPEARANCEUR CLEAR 07/02/2012 2014   LABSPEC 1.011 07/02/2012 2014   PHURINE 7.0 07/02/2012 2014   GLUCOSEU NEGATIVE 07/02/2012 2014   HGBUR  NEGATIVE 07/02/2012 2014   [place] NEGATIVE 07/02/2012 2014   [place] 07/02/2012 2014   PROTEINUR NEGATIVE 07/02/2012 2014   UROBILINOGEN 0.2 07/02/2012 2014   NITRITE NEGATIVE 07/02/2012 2014   LEUKOCYTESUR NEGATIVE 07/02/2012 2014   Sepsis Labs Invalid input(s): PROCALCITONIN,  WBC,  LACTICIDVEN Microbiology Recent Results (from the past 240 hour(s))  SARS CORONAVIRUS 2 (TAT 6-24 HRS) Nasopharyngeal Nasopharyngeal Swab     Status: None   Collection Time: 10/29/20  6:37 PM   Specimen: Nasopharyngeal Swab  Result Value Ref Range Status   SARS Coronavirus 2 NEGATIVE NEGATIVE Final    Comment: (NOTE) SARS-CoV-2 target nucleic acids are NOT DETECTED.  The SARS-CoV-2 RNA is generally detectable in upper and lower respiratory specimens during the acute phase of infection. Negative results do not preclude SARS-CoV-2 infection, do not rule out co-infections with other pathogens, and should not be used as the sole basis for treatment or other patient management decisions. Negative results must be combined with clinical observations, patient history, and epidemiological information. The expected result is Negative.  Fact Sheet for Patients: SugarRoll.be  Fact Sheet for Healthcare Providers: https://www.woods-mathews.com/  This test is not yet approved or cleared by the [place] FDA and  has been authorized for detection and/or diagnosis of SARS-CoV-2 by FDA under an Emergency Use Authorization (EUA). This EUA will remain  in effect (meaning this test can be used) for the duration of the COVID-19 declaration under Se ction 564(b)(1) of the Act, 21 U.S.C. section 360bbb-3(b)(1), unless the authorization is terminated or revoked sooner.  Performed at [facility], [place] [address] [place],  [address]    Time coordinating discharge: 35 minutes  SIGNED:  Kerney Elbe, DO [facility] 10/31/2020,  10:47 AM Pager is on [place]  If 7PM-7AM, please contact night-coverage [email or web address]

## 2020-10-31 NOTE — Progress Notes (Signed)
Patient discharged to home with family, discharge instructions reviewed with patient and wife who verbalized understanding.  

## 2020-11-08 ENCOUNTER — Other Ambulatory Visit: Payer: Self-pay | Admitting: Psychiatry

## 2020-11-08 DIAGNOSIS — F5105 Insomnia due to other mental disorder: Secondary | ICD-10-CM

## 2020-11-08 DIAGNOSIS — F99 Mental disorder, not otherwise specified: Secondary | ICD-10-CM

## 2020-11-15 ENCOUNTER — Other Ambulatory Visit: Payer: Self-pay | Admitting: Psychiatry

## 2020-11-15 DIAGNOSIS — F99 Mental disorder, not otherwise specified: Secondary | ICD-10-CM

## 2020-11-15 DIAGNOSIS — F5105 Insomnia due to other mental disorder: Secondary | ICD-10-CM

## 2020-11-16 NOTE — Telephone Encounter (Signed)
Refills should already be on file

## 2020-11-24 ENCOUNTER — Other Ambulatory Visit: Payer: Self-pay | Admitting: Psychiatry

## 2020-11-24 DIAGNOSIS — F5105 Insomnia due to other mental disorder: Secondary | ICD-10-CM

## 2021-01-05 ENCOUNTER — Ambulatory Visit: Payer: Medicare Other | Admitting: Psychiatry

## 2021-01-21 ENCOUNTER — Ambulatory Visit: Payer: Medicare Other | Admitting: Podiatry

## 2021-01-21 ENCOUNTER — Other Ambulatory Visit: Payer: Self-pay

## 2021-01-21 ENCOUNTER — Encounter: Payer: Self-pay | Admitting: Podiatry

## 2021-01-21 DIAGNOSIS — G629 Polyneuropathy, unspecified: Secondary | ICD-10-CM | POA: Diagnosis not present

## 2021-01-21 DIAGNOSIS — M79674 Pain in right toe(s): Secondary | ICD-10-CM

## 2021-01-21 DIAGNOSIS — B351 Tinea unguium: Secondary | ICD-10-CM | POA: Diagnosis not present

## 2021-01-21 DIAGNOSIS — M79675 Pain in left toe(s): Secondary | ICD-10-CM | POA: Diagnosis not present

## 2021-01-21 NOTE — Progress Notes (Signed)
This patient returns to my office for at risk foot care.  This patient requires this care by a professional since this patient will be at risk due to having diabetic neuropathy.  Patient is taking neurontin. Patient was referred to this office by Dr.  Harrington Challenger.  Patient says he experiences weakness when walking which he says is due to neuropathy. This patient is unable to cut nails himself since the patient cannot reach his nails.These nails are painful walking and wearing shoes.  This patient presents for at risk foot care today.  General Appearance  Alert, conversant and in no acute stress.  Vascular  Dorsalis pedis and posterior tibial  pulses are palpable  bilaterally.  Capillary return is within normal limits  bilaterally. Temperature is within normal limits  bilaterally.  Neurologic  Senn-Weinstein monofilament wire test within normal limits  bilaterally. Muscle power within normal limits bilaterally.  Nails Thick disfigured discolored nails with subungual debris  hallux nails  bilaterally. No evidence of bacterial infection or drainage bilaterally.  Orthopedic  No limitations of motion  feet .  No crepitus or effusions noted.  No bony pathology or digital deformities noted.  HAV  right foot.  Skin  normotropic skin with no porokeratosis noted bilaterally.  No signs of infections or ulcers noted.     Onychomycosis  Pain in right toes  Pain in left toes  Consent was obtained for treatment procedures.   Mechanical debridement of nails 1-5  bilaterally performed with a nail nipper.  Filed with dremel without incident.    Return office visit  3 months                    Told patient to return for periodic foot care and evaluation due to potential at risk complications.   Gardiner Barefoot DPM

## 2021-02-17 ENCOUNTER — Other Ambulatory Visit: Payer: Self-pay

## 2021-02-17 ENCOUNTER — Encounter: Payer: Self-pay | Admitting: Psychiatry

## 2021-02-17 ENCOUNTER — Ambulatory Visit (INDEPENDENT_AMBULATORY_CARE_PROVIDER_SITE_OTHER): Payer: Medicare Other | Admitting: Psychiatry

## 2021-02-17 DIAGNOSIS — R42 Dizziness and giddiness: Secondary | ICD-10-CM

## 2021-02-17 DIAGNOSIS — F411 Generalized anxiety disorder: Secondary | ICD-10-CM

## 2021-02-17 DIAGNOSIS — F1029 Alcohol dependence with unspecified alcohol-induced disorder: Secondary | ICD-10-CM | POA: Diagnosis not present

## 2021-02-17 DIAGNOSIS — F5105 Insomnia due to other mental disorder: Secondary | ICD-10-CM

## 2021-02-17 DIAGNOSIS — F99 Mental disorder, not otherwise specified: Secondary | ICD-10-CM

## 2021-02-17 MED ORDER — HYDROXYZINE HCL 10 MG PO TABS: ORAL_TABLET | ORAL | 1 refills | Status: DC

## 2021-02-17 MED ORDER — LORAZEPAM 0.5 MG PO TABS
1.0000 mg | ORAL_TABLET | Freq: Every day | ORAL | 5 refills | Status: DC
Start: 2021-02-17 — End: 2021-03-23

## 2021-02-17 NOTE — Progress Notes (Signed)
Eduardo Watts [id number] [birth year] 75 y.o.   Subjective:   Patient ID:  Eduardo Watts. is a 75 y.o. (DOB 19-Aug-1945) male.  Chief Complaint:  Chief Complaint  Patient presents with   Follow-up   Anxiety   Stress    health    Anxiety Symptoms include dizziness. Patient reports no chest pain, confusion, decreased concentration, nervous/anxious behavior, palpitations or suicidal ideas.   Eduardo Watts. Eduardo Watts presents to the office today for follow-up of sleep, anxiety and alcohol abuse.  seen 11/2019.  No meds changed.  He was not significantly depressed or anxious but was needing medication for insomnia. Meds: Flexeril 10 HS, Gabapentin 300 HS for neuropathy helps a little, lorazepam 0.5mg  2 HS,   sleeps good with that.  05/06/20 appt noted: CO neuropathy in feet with numbness and tingling in feet.   Seeing neurologist.  No change in meds so far.  He's prediabetic. This is causing a balance issue.   Sleep well with lorazepam.  Vaccinated. Plan: no changes  08/26/2020 appt noted: [name] Dizziness when gets up in the AM.  Neuropathy problems.  Increased gabapentin which he seemed to tolerate.  Fall the other day.  Energy not as good.  Lorazepam & hydroxyzine still helps sleep.  Celebrated 42 year anniversary party.  Good time.    Wife concerned about his meds and wonders if they are affecting his meds.    02/17/2021 appt noted: Neuropathy worse.  Tried gabapentin without help.  Balance is off badly.  Can't shower standing up.  Not much pain.  On Lyrica.   Sleep well with meds about 6 hours.  Can get up at night to bedtime without problems.   Drinking a bottle of wine per day Wife concerned about his alcohol.  Doesn't want to attend AA bc of having to talk in public over it.  Patient reports stable mood and denies depressed or irritable moods usually.  Patient denies any recent difficulty with anxiety.  Patient denies difficulty with sleep initiation or  maintenance. Ativan 1 mg and hydroxyzine help sleep.  Some mild EMA.  8 hours.   Denies appetite disturbance.  Patient reports that appetite,  and motivation have been good.  Patient denies any difficulty with concentration.  Patient denies any suicidal ideation.  Occ name recall problems.  Past Psychiatric Medication Trials:  Ambien felll out of bed, Ativan, Flexeril, Belsomra, hydrozyzine, Xanax hives, Prosom, trazodone NR, No Seroquel DT B 's suicide, cyclobenzaprine helps,  Klonopin SE, Librium detox,  Lithium, fluoxetine, topiramate, propranolol, Naltrexone   Review of Systems:  Review of Systems  Cardiovascular:  Negative for chest pain and palpitations.  Neurological:  Positive for dizziness and numbness. Negative for tremors and weakness.       Neuropathy in feet affecting  balance  Psychiatric/Behavioral:  Negative for agitation, behavioral problems, confusion, decreased concentration, dysphoric mood, hallucinations, self-injury, sleep disturbance and suicidal ideas. The patient is not nervous/anxious and is not hyperactive.    Medications: I have reviewed the patient's current medications.  Current Outpatient Medications  Medication Sig Dispense Refill   acetaminophen (TYLENOL) 325 MG tablet Take 2 tablets (650 mg total) by mouth every 6 (six) hours as needed for mild pain (or Fever >/= 101). 20 tablet 0   alfuzosin (UROXATRAL) 10 MG 24 hr tablet Take 10 mg by mouth daily.     B Complex Vitamins (B COMPLEX PO) Take 1 tablet by mouth once a week.  carvedilol (COREG) 3.125 MG tablet Take 3.125 mg by mouth at bedtime.     Cholecalciferol (VITAMIN D3) 5000 units CAPS Take 1 capsule by mouth at bedtime.     cyclobenzaprine (FLEXERIL) 10 MG tablet TAKE 1 TABLET BY MOUTH AT BEDTIME 30 tablet 6   ezetimibe (ZETIA) 10 MG tablet Take 1 tablet (10 mg total) by mouth daily. (Patient taking differently: Take 10 mg by mouth at bedtime.) 30 tablet 11   finasteride (PROSCAR) 5 MG tablet Take 5 mg  by mouth at bedtime. Take 1 tablet by mouth every other night     fluticasone (FLONASE) 50 MCG/ACT nasal spray Place 1 spray into both nostrils daily.      folic acid (FOLVITE) 1 MG tablet Take 1 tablet (1 mg total) by mouth daily. 30 tablet 0   Magnesium 100 MG CAPS Take 1 capsule by mouth at bedtime.     Omega-3 Fatty Acids (FISH OIL) 1000 MG CAPS Take 1 capsule by mouth once a week.     pregabalin (LYRICA) 100 MG capsule Take 100 mg by mouth 3 (three) times daily.     rosuvastatin (CRESTOR) 5 MG tablet Take 5 mg by mouth 2 (two) times a week.     sildenafil (REVATIO) 20 MG tablet Take 20 mg by mouth daily as needed (ed).     thiamine 100 MG tablet Take 1 tablet (100 mg total) by mouth daily. 30 tablet 0   traMADol (ULTRAM) 50 MG tablet Take 1 tablet (50 mg total) by mouth every 12 (twelve) hours as needed for moderate pain. 10 tablet 0   zinc sulfate 220 (50 Zn) MG capsule Take 220 mg by mouth every evening.     CALCIUM PO Take 1 tablet by mouth at bedtime. (Patient not taking: Reported on 02/17/2021)     clotrimazole-betamethasone (LOTRISONE) cream Apply topically.     gabapentin (NEURONTIN) 300 MG capsule Take 600 mg by mouth at bedtime. (Patient not taking: Reported on 02/17/2021)     hydrocortisone cream 1 % Apply topically 2 (two) times daily. (Patient not taking: Reported on 02/17/2021) 30 g 0   hydrOXYzine (ATARAX/VISTARIL) 10 MG tablet TAKE 2 TABLETS BY MOUTH THREE TIMES DAILY AS NEEDED FOR ANXIETY 180 tablet 1   lidocaine (LIDODERM) 5 % Place 1 patch onto the skin daily. Remove & Discard patch within 12 hours or as directed by MD (Patient not taking: Reported on 02/17/2021) 30 patch 0   LORazepam (ATIVAN) 0.5 MG tablet Take 2 tablets (1 mg total) by mouth at bedtime. 60 tablet 5   metFORMIN (GLUCOPHAGE) 500 MG tablet Take 500 mg by mouth every evening. (Patient not taking: Reported on 02/17/2021)     Multiple Vitamin (MULTIVITAMIN WITH MINERALS) TABS tablet Take 1 tablet by mouth daily.  (Patient not taking: Reported on 02/17/2021) 30 tablet 0   polyethylene glycol (MIRALAX / GLYCOLAX) 17 g packet Take 17 g by mouth daily as needed for mild constipation. (Patient not taking: Reported on 02/17/2021) 14 each 0   No current facility-administered medications for this visit.    Medication Side Effects: None  Allergies:  Allergies  Allergen Reactions   Zocor [Simvastatin] Other (See Comments)    Muscle aches   Trazodone And Nefazodone Other (See Comments)    "crazy"    Past Medical History:  Diagnosis Date   Anxiety    Asthma    as a child   Depression    sees Dr [name]   History of BPH  Dr.Dahlstedt   Hyperlipidemia    Prediabetes    Sleep disturbances     Family History  Problem Relation Age of Onset   Dementia Mother    Stroke Mother    Cancer - Prostate Father    Melanoma Father    Hyperlipidemia Sister        high cholestrol   Depression Brother    Suicidality Brother    Lung disease Maternal Grandfather    Heart attack Neg Hx    Hypertension Neg Hx     Social History   Socioeconomic History   Marital status: Married    Spouse name: Not on file   Number of children: 1   Years of education: 43   Highest education level: Not on file  Occupational History   Not on file  Tobacco Use   Smoking status: Never   Smokeless tobacco: Never  Vaping Use   Vaping Use: Never used  Substance and Sexual Activity   Alcohol use: Yes    Alcohol/week: 14.0 - 21.0 standard drinks    Types: 14 - 21 Glasses of wine per week   Drug use: No   Sexual activity: Not on file  Other Topics Concern   Not on file  Social History Narrative   Never smoked   Alcohol yes -2-3 glasses of wine/night   Caffeine- yes 1 cup of coffee/day   Recreational drug use -No   Diet- fruits and vegetables,less fried food more pasta   Exercise- decrease to fatigue ,does yard work,and  walks occasionally    Occupation -employed Press photographer   Married 1 daughter/ 1 granddaughter   Goes  by [name]" loves animals   Social Determinants of Health   Financial Resource Strain: Not on file  Food Insecurity: Not on file  Transportation Needs: Not on file  Physical Activity: Not on file  Stress: Not on file  Social Connections: Not on file  Intimate Partner Violence: Not on file    Past Medical History, Surgical history, Social history, and Family history were reviewed and updated as appropriate.   Please see review of systems for further details on the patient's review from today.   Objective:   Physical Exam:  There were no vitals taken for this visit.  Physical Exam Constitutional:      General: He is not in acute distress.    Appearance: He is well-developed.  Musculoskeletal:        General: No deformity.  Neurological:     Mental Status: He is alert and oriented to person, place, and time.     Motor: No tremor.     Coordination: Coordination abnormal.     Gait: Gait abnormal.  Psychiatric:        Attention and Perception: Attention and perception normal.        Mood and Affect: Mood is not anxious or depressed. Affect is not labile, blunt, angry or inappropriate.        Speech: Speech normal. Speech is not rapid and pressured.        Behavior: Behavior normal.        Thought Content: Thought content normal. Thought content does not include homicidal or suicidal ideation. Thought content does not include homicidal or suicidal plan.        Cognition and Memory: Cognition normal.        Judgment: Judgment normal.     Comments: Insight intact. No auditory or visual hallucinations. No delusions.  More stressed.  Lab Review:     Component Value Date/Time   NA 133 (L) 10/31/2020 0528   NA 136 05/24/2017 0845   K 4.5 10/31/2020 0528   CL 101 10/31/2020 0528   CO2 21 (L) 10/31/2020 0528   GLUCOSE 112 (H) 10/31/2020 0528   BUN 19 10/31/2020 0528   BUN 11 05/24/2017 0845   CREATININE 1.02 10/31/2020 0528   CALCIUM 8.8 (L) 10/31/2020 0528   PROT 6.7  10/31/2020 0528   PROT 7.0 04/15/2019 0854   ALBUMIN 3.7 10/31/2020 0528   ALBUMIN 4.5 05/24/2017 0845   AST 27 10/31/2020 0528   ALT 27 10/31/2020 0528   ALKPHOS 86 10/31/2020 0528   BILITOT 0.7 10/31/2020 0528   BILITOT 1.1 05/24/2017 0845   GFRNONAA >60 10/31/2020 0528   GFRAA 99 05/24/2017 0845       Component Value Date/Time   WBC 6.3 10/31/2020 0528   RBC 4.37 10/31/2020 0528   HGB 13.8 10/31/2020 0528   HCT 42.6 10/31/2020 0528   PLT 190 10/31/2020 0528   MCV 97.5 10/31/2020 0528   MCH 31.6 10/31/2020 0528   MCHC 32.4 10/31/2020 0528   RDW 12.1 10/31/2020 0528   LYMPHSABS 0.8 10/31/2020 0528   MONOABS 0.7 10/31/2020 0528   EOSABS 0.2 10/31/2020 0528   BASOSABS 0.0 10/31/2020 0528    No results found for: POCLITH, LITHIUM   No results found for: PHENYTOIN, PHENOBARB, VALPROATE, CBMZ   .res Assessment: Plan:    Generalized anxiety disorder  Alcohol dependence with unspecified alcohol-induced disorder ([place])  Insomnia due to other mental disorder - Plan: LORazepam (ATIVAN) 0.5 MG tablet, hydrOXYzine (ATARAX/VISTARIL) 10 MG tablet  Dizziness on standing   Disc sleep med options including no meds.  Wife complains about him taking sleep meds.  Chronic insomnia otherwise. Needs the meds. Takes Flexeril every other day and doesn't take hydroxyzine on that night alternates them. Tolerating meds.  Answered questions about diagnosis and prognosis.  Disc this at length.  He has well controled psych sx.   Answered questions about purpose of each med.  We discussed the short-term risks associated with benzodiazepines including sedation and increased fall risk among others.  Discussed long-term side effect risk including dependence, potential withdrawal symptoms, and the potential eventual dose-related risk of dementia.  Newer studies refute the risk Also discussed the consequences of poor sleep on brain and body health.  Also some risk from hydroxyzine also causing  short-term problems.  He's doing well without SE so no changes.    Sleep hygiene. Disc timing of sleep meds to help him stay asleep.  Disc option increase hydroxyzine and hold flexeril or vice versa.  Flexeril helps back pain.  Lorazepam helps sleep as well.  Manages well with 6 hours of sleep.  Disc risk of multiple sleepers.  Overall he's doing well and sleep meds due not appear to be cause of sleepiness.  But discuss the reasons for it.  Extensive discussion of distinguishing dizziness related HS meds vs orthostatic.vs neuropathy.  Disc importance of managing glucose to reduce rate of progression of neuropathy.  The following offer intensive outpatient programs: [facility] intensive outpatient program-available through the main [facility] phone number and ask for [place] [facility]  He is not markedly depressed nor markedly anxious at this time.  He does need the medications for insomnia as noted.  No med changes today Lorazepam 1 mg and hydroxyzine 20 mg HS  FU 6 mos  Lynder Parents,  MD, DFAPA     Please see After Visit Summary for patient specific instructions.  Future Appointments  Date Time Provider [place]  05/05/2021  8:00 AM [name], MD [facility]  05/27/2021  7:30 AM Gardiner Barefoot, DPM [facility]  10/07/2021  9:00 AM Deneise Lever, MD [facility] None    No orders of the defined types were placed in this encounter.     -------------------------------

## 2021-02-17 NOTE — Patient Instructions (Addendum)
The following offer intensive outpatient programs:  [facility] intensive outpatient program-available through the main [facility] phone number and ask for [place]  Alcoholics Anonymous

## 2021-02-24 ENCOUNTER — Ambulatory Visit: Payer: Medicare Other | Admitting: Psychiatry

## 2021-03-23 ENCOUNTER — Other Ambulatory Visit: Payer: Self-pay | Admitting: Psychiatry

## 2021-03-23 DIAGNOSIS — F99 Mental disorder, not otherwise specified: Secondary | ICD-10-CM

## 2021-05-04 NOTE — Progress Notes (Signed)
Cardiology Office Note   Date:  05/05/2021   ID:  Eduardo Frick., DOB 1946-03-02, MRN [id number]  PCP:  Eduardo Cruel, MD    No chief complaint on file.  CAD  Wt Readings from Last 3 Encounters:  05/05/21 203 lb (92.1 kg)  10/29/20 202 lb (91.6 kg)  10/21/20 202 lb 3.2 oz (91.7 kg)       History of Present Illness: Eduardo [place]. is a 75 y.o. male    who had a heart cath in 9/15. This showed moderate RCA disease. There was no significant obstructive disease. His ejection fraction was low normal. He had a pulmonary eval for cough.  This is improved with less Coreg.     He was concerned in the past about AFib and stroke risk.  He felt that he had daily palpitation. He hit his chest to help the palpitations resolve.  Prior w/u for AFib has been negative.   He had given up alcohol since the day of the cath in 2015 but restarted alcohol in 2017.      He has an iron overload syndrome as well.    He started nighttime oxygen in 2017- but only uses it half the night, 2L/min.  He had another sleep study in 2016 and it was normal.  No need for it during the day. 94% during the day.  88% at night.  Prescribed by Eduardo Watts.   In the past, he had decreased his alcohol intake to 2 glasses a day, down from a bottle a day.  He has maintained this level.  He is working with Eduardo. Clovis Watts to decrease his cravings for alcohol.  Acampro was started for this.    He saw Eduardo. Harrington Challenger. Diagnosed with peripheral neuropathy.  Started on Gabapentin.    In Nov 2020, it was noted he was walking 90 minutes/week despite foot numbness.    Walking limited by the neuropathy pain. He has been to several different doctors. No improvement.  He has had some side effects with some of the medicines.   Since the last visit, Neuropathy continues to be his biggest issues.   Denies : Chest pain. Dizziness. Leg edema. Nitroglycerin use. Orthopnea. Palpitations. Paroxysmal nocturnal dyspnea. Shortness of  breath. Syncope.   Trying to walk some.  Thinks he should be doing more.  He thinks a stationary bike may be easier on his feet.  He is not a member at a gym.Marland Kitchen    Past Medical History:  Diagnosis Date   Anxiety    Asthma    as a child   Depression    sees Eduardo Watts   History of BPH    [name]   Hyperlipidemia    Prediabetes    Sleep disturbances     Past Surgical History:  Procedure Laterality Date   LEFT HEART CATHETERIZATION WITH CORONARY ANGIOGRAM N/A 04/30/2014   Procedure: LEFT HEART CATHETERIZATION WITH CORONARY ANGIOGRAM;  Surgeon: [name], MD;  Location: [facility] CATH LAB;  Service: Cardiovascular;  Laterality: N/A;   NASAL SEPTUM SURGERY  1975   R cheek benign tumor      1992, again 1998.   TONSILLECTOMY       Current Outpatient Medications  Medication Sig Dispense Refill   acetaminophen (TYLENOL) 325 MG tablet Take 2 tablets (650 mg total) by mouth every 6 (six) hours as needed for mild pain (or Fever >/= 101). 20 tablet 0   alfuzosin (UROXATRAL) 10 MG  24 hr tablet Take 10 mg by mouth daily.     B Complex Vitamins (B COMPLEX PO) Take 1 tablet by mouth once a week.     CALCIUM PO Take 1 tablet by mouth at bedtime.     carvedilol (COREG) 3.125 MG tablet Take 3.125 mg by mouth at bedtime.     Cholecalciferol (VITAMIN D3) 5000 units CAPS Take 1 capsule by mouth at bedtime.     clotrimazole-betamethasone (LOTRISONE) cream Apply topically.     cyclobenzaprine (FLEXERIL) 10 MG tablet TAKE 1 TABLET BY MOUTH AT BEDTIME 30 tablet 6   ezetimibe (ZETIA) 10 MG tablet Take 1 tablet (10 mg total) by mouth daily. (Patient taking differently: Take 10 mg by mouth at bedtime.) 30 tablet 11   finasteride (PROSCAR) 5 MG tablet Take 5 mg by mouth at bedtime. Take 1 tablet by mouth every other night     fluticasone (FLONASE) 50 MCG/ACT nasal spray Place 1 spray into both nostrils daily.      folic acid (FOLVITE) 1 MG tablet Take 1 tablet (1 mg total) by mouth daily. 30 tablet  0   hydrocortisone cream 1 % Apply topically 2 (two) times daily. 30 g 0   hydrOXYzine (ATARAX/VISTARIL) 10 MG tablet TAKE 2 TABLETS BY MOUTH THREE TIMES DAILY AS NEEDED FOR ANXIETY 180 tablet 1   LORazepam (ATIVAN) 0.5 MG tablet TAKE 2 TABLETS BY MOUTH AT BEDTIME 60 tablet 5   Magnesium 100 MG CAPS Take 1 capsule by mouth at bedtime.     Multiple Vitamin (MULTIVITAMIN WITH MINERALS) TABS tablet Take 1 tablet by mouth daily. 30 tablet 0   Omega-3 Fatty Acids (FISH OIL) 1000 MG CAPS Take 1 capsule by mouth once a week.     polyethylene glycol (MIRALAX / GLYCOLAX) 17 g packet Take 17 g by mouth daily as needed for mild constipation. 14 each 0   pregabalin (LYRICA) 100 MG capsule Take 100 mg by mouth 3 (three) times daily.     rosuvastatin (CRESTOR) 5 MG tablet Take 5 mg by mouth 2 (two) times a week.     sildenafil (REVATIO) 20 MG tablet Take 20 mg by mouth daily as needed (ed).     thiamine 100 MG tablet Take 1 tablet (100 mg total) by mouth daily. 30 tablet 0   traMADol (ULTRAM) 50 MG tablet Take 1 tablet (50 mg total) by mouth every 12 (twelve) hours as needed for moderate pain. 10 tablet 0   zinc sulfate 220 (50 Zn) MG capsule Take 220 mg by mouth every evening.     gabapentin (NEURONTIN) 300 MG capsule Take 600 mg by mouth at bedtime. (Patient not taking: Reported on 05/05/2021)     lidocaine (LIDODERM) 5 % Place 1 patch onto the skin daily. Remove & Discard patch within 12 hours or as directed by MD (Patient not taking: Reported on 05/05/2021) 30 patch 0   No current facility-administered medications for this visit.    Allergies:   Zocor [simvastatin] and Trazodone and nefazodone    Social History:  The patient  reports that he has never smoked. He has never used smokeless tobacco. He reports current alcohol use of about 14.0 - 21.0 standard drinks per week. He reports that he does not use drugs.   Family History:  The patient's family history includes Cancer - Prostate in his father;  Dementia in his mother; Depression in his brother; Hyperlipidemia in his sister; Lung disease in his maternal grandfather; Melanoma in his  father; Stroke in his mother; Suicidality in his brother.    ROS:  Please see the history of present illness.   Otherwise, review of systems are positive for diarrhea when he took metformin.   All other systems are reviewed and negative.    PHYSICAL EXAM: VS:  BP 120/72   Pulse 95   Ht 6\' 2"  (1.88 m)   Wt 203 lb (92.1 kg)   SpO2 93%   BMI 26.06 kg/m  , BMI Body mass index is 26.06 kg/m. GEN: Well nourished, well developed, in no acute distress HEENT: normal Neck: no JVD, carotid bruits, or masses Cardiac: RRR; no murmurs, rubs, or gallops,no edema  Respiratory:  clear to auscultation bilaterally, normal work of breathing GI: soft, nontender, nondistended, + BS MS: no deformity or atrophy Skin: warm and dry, no rash Neuro:  Strength and sensation are intact Psych: euthymic mood, full affect   EKG:   The ekg ordered today demonstrates NSR, IRBBB, TWI, no change   Recent Labs: 10/31/2020: ALT 27; BUN 19; Creatinine, Ser 1.02; Hemoglobin 13.8; Magnesium 2.4; Platelets 190; Potassium 4.5; Sodium 133; TSH 4.083   Lipid Panel    Component Value Date/Time   CHOL 224 (H) 05/24/2017 0845   TRIG 141 05/24/2017 0845   HDL 57 05/24/2017 0845   CHOLHDL 3.9 05/24/2017 0845   LDLCALC 139 (H) 05/24/2017 0845     Other studies Reviewed: Additional studies/ records that were reviewed today with results demonstrating: labs reviewed.   ASSESSMENT AND PLAN:  CAD: Moderate by prior cath.  No angina on medical therapy.  Walking target noted below. I asked him to look into stationary bike given foot problems.   DM: A1C 6.8.  Now off metformin due to GI issues. Decreased meat intake.   Alcohol use: moderate use continues.  Discussed that excess can lead to neuropathy.  Going to [place] meeting per his wife's request.  Hyperlipidemia: The current medical  regimen is effective;  continue present plan and medications.  More of a plant based diet.  Abnormal ECG: NSR, IRBBB, TWI, no change Neuropathy: Still a big issue for him.    Current medicines are reviewed at length with the patient today.  The patient concerns regarding his medicines were addressed.  The following changes have been made:  No change  Labs/ tests ordered today include:   Orders Placed This Encounter  Procedures   EKG 12-Lead    Recommend 150 minutes/week of aerobic exercise Low fat, low carb, high fiber diet recommended  Disposition:   FU in 6 months   Signed, Larae Grooms, MD  05/05/2021 9:04 AM    [facility] [place], [place], [place]  [address] Phone: [phone]; Fax: [phone]

## 2021-05-05 ENCOUNTER — Ambulatory Visit: Payer: Medicare Other | Admitting: Interventional Cardiology

## 2021-05-05 ENCOUNTER — Encounter: Payer: Self-pay | Admitting: Interventional Cardiology

## 2021-05-05 ENCOUNTER — Other Ambulatory Visit: Payer: Self-pay

## 2021-05-05 VITALS — BP 120/72 | HR 95 | Ht 74.0 in | Wt 203.0 lb

## 2021-05-05 DIAGNOSIS — E782 Mixed hyperlipidemia: Secondary | ICD-10-CM

## 2021-05-05 DIAGNOSIS — E1159 Type 2 diabetes mellitus with other circulatory complications: Secondary | ICD-10-CM | POA: Diagnosis not present

## 2021-05-05 DIAGNOSIS — I4510 Unspecified right bundle-branch block: Secondary | ICD-10-CM

## 2021-05-05 DIAGNOSIS — Z789 Other specified health status: Secondary | ICD-10-CM

## 2021-05-05 DIAGNOSIS — I25119 Atherosclerotic heart disease of native coronary artery with unspecified angina pectoris: Secondary | ICD-10-CM

## 2021-05-05 DIAGNOSIS — Z7289 Other problems related to lifestyle: Secondary | ICD-10-CM

## 2021-05-05 NOTE — Patient Instructions (Signed)
Medication Instructions:  Your physician recommends that you continue on your current medications as directed. Please refer to the Current Medication list given to you today.  *If you need a refill on your cardiac medications before your next appointment, please call your pharmacy*   Lab Work: none If you have labs (blood work) drawn today and your tests are completely normal, you will receive your results only by: [place] (if you have MyChart) OR A paper copy in the mail If you have any lab test that is abnormal or we need to change your treatment, we will call you to review the results.   Testing/Procedures: none   Follow-Up: At [facility], you and your health needs are our priority.  As part of our continuing mission to provide you with exceptional heart care, we have created designated Provider Care Teams.  These Care Teams include your primary Cardiologist (physician) and Advanced Practice Providers (APPs -  Physician Assistants and Nurse Practitioners) who all work together to provide you with the care you need, when you need it.  We recommend signing up for the patient portal called "MyChart".  Sign up information is provided on this After Visit Summary.  MyChart is used to connect with patients for Virtual Visits (Telemedicine).  Patients are able to view lab/test results, encounter notes, upcoming appointments, etc.  Non-urgent messages can be sent to your provider as well.   To learn more about what you can do with MyChart, go to [email or web address].    Your next appointment:   May 2023  The format for your next appointment:   In Person  Provider:   You may see Larae Grooms, MD or one of the following Advanced Practice Providers on your designated Care Team:   Melina Copa, PA-C Ermalinda Barrios, PA-C   Other Instructions High-Fiber Eating Plan Fiber, also called dietary fiber, is a type of carbohydrate. It is found foods such as fruits, vegetables,  whole grains, and beans. A high-fiber diet can have many health benefits. Your health care provider may recommend a high-fiber diet to help: Prevent constipation. Fiber can make your bowel movements more regular. Lower your cholesterol. Relieve the following conditions: Inflammation of veins in the anus (hemorrhoids). Inflammation of specific areas of the digestive tract (uncomplicated diverticulosis). A problem of the large intestine, also called the colon, that sometimes causes pain and diarrhea (irritable bowel syndrome, or IBS). Prevent overeating as part of a weight-loss plan. Prevent heart disease, type 2 diabetes, and certain cancers. What are tips for following this plan? Reading food labels  Check the nutrition facts label on food products for the amount of dietary fiber. Choose foods that have 5 grams of fiber or more per serving. The goals for recommended daily fiber intake include: Men (age 56 or younger): 34-38 g. Men (over age 48): 28-34 g. Women (age 26 or younger): 25-28 g. Women (over age 34): 22-25 g. Your daily fiber goal is _____________ g. Shopping Choose whole fruits and vegetables instead of processed forms, such as apple juice or applesauce. Choose a wide variety of high-fiber foods such as avocados, lentils, oats, and kidney beans. Read the nutrition facts label of the foods you choose. Be aware of foods with added fiber. These foods often have high sugar and sodium amounts per serving. Cooking Use whole-grain flour for baking and cooking. Cook with brown rice instead of white rice. Meal planning Start the day with a breakfast that is high in fiber, such as a cereal that  contains 5 g of fiber or more per serving. Eat breads and cereals that are made with whole-grain flour instead of refined flour or white flour. Eat brown rice, bulgur wheat, or millet instead of white rice. Use beans in place of meat in soups, salads, and pasta dishes. Be sure that half of the  grains you eat each day are whole grains. General information You can get the recommended daily intake of dietary fiber by: Eating a variety of fruits, vegetables, grains, nuts, and beans. Taking a fiber supplement if you are not able to take in enough fiber in your diet. It is better to get fiber through food than from a supplement. Gradually increase how much fiber you consume. If you increase your intake of dietary fiber too quickly, you may have bloating, cramping, or gas. Drink plenty of water to help you digest fiber. Choose high-fiber snacks, such as berries, raw vegetables, nuts, and popcorn. What foods should I eat? Fruits Berries. Pears. Apples. Oranges. Avocado. Prunes and raisins. Dried figs. Vegetables Sweet potatoes. Spinach. Kale. Artichokes. Cabbage. Broccoli. Cauliflower. Green peas. Carrots. Squash. Grains Whole-grain breads. Multigrain cereal. Oats and oatmeal. Brown rice. Barley. Bulgur wheat. [place]. Quinoa. Bran muffins. Popcorn. Rye wafer crackers. Meats and other proteins Navy beans, kidney beans, and pinto beans. Soybeans. Split peas. Lentils. Nuts and seeds. Dairy Fiber-fortified yogurt. Beverages Fiber-fortified soy milk. Fiber-fortified orange juice. Other foods Fiber bars. The items listed above may not be a complete list of recommended foods and beverages. Contact a dietitian for more information. What foods should I avoid? Fruits Fruit juice. Cooked, strained fruit. Vegetables Fried potatoes. Canned vegetables. Well-cooked vegetables. Grains White bread. Pasta made with refined flour. White rice. Meats and other proteins Fatty cuts of meat. Fried chicken or fried fish. Dairy Milk. Yogurt. Cream cheese. Sour cream. Fats and oils Butters. Beverages Soft drinks. Other foods Cakes and pastries. The items listed above may not be a complete list of foods and beverages to avoid. Talk with your dietitian about what choices are best for  you. Summary Fiber is a type of carbohydrate. It is found in foods such as fruits, vegetables, whole grains, and beans. A high-fiber diet has many benefits. It can help to prevent constipation, lower blood cholesterol, aid weight loss, and reduce your risk of heart disease, diabetes, and certain cancers. Increase your intake of fiber gradually. Increasing fiber too quickly may cause cramping, bloating, and gas. Drink plenty of water while you increase the amount of fiber you consume. The best sources of fiber include whole fruits and vegetables, whole grains, nuts, seeds, and beans. This information is not intended to replace advice given to you by your health care provider. Make sure you discuss any questions you have with your health care provider. Document Revised: 11/27/2019 Document Reviewed: 11/27/2019 Elsevier Patient Education  2022 [employer].

## 2021-05-27 ENCOUNTER — Ambulatory Visit: Payer: Medicare Other | Admitting: Podiatry

## 2021-05-27 ENCOUNTER — Other Ambulatory Visit: Payer: Self-pay

## 2021-05-27 ENCOUNTER — Encounter: Payer: Self-pay | Admitting: Podiatry

## 2021-05-27 DIAGNOSIS — M79675 Pain in left toe(s): Secondary | ICD-10-CM

## 2021-05-27 DIAGNOSIS — M79674 Pain in right toe(s): Secondary | ICD-10-CM | POA: Diagnosis not present

## 2021-05-27 DIAGNOSIS — G629 Polyneuropathy, unspecified: Secondary | ICD-10-CM

## 2021-05-27 DIAGNOSIS — B351 Tinea unguium: Secondary | ICD-10-CM

## 2021-05-27 NOTE — Progress Notes (Signed)
This patient returns to my office for at risk foot care.  This patient requires this care by a professional since this patient will be at risk due to having neuropathy.  Patient is taking neurontin. Patient was referred to this office by Dr.  Harrington Challenger.  Patient says he experiences weakness when walking which he says is due to neuropathy. This patient is unable to cut nails himself since the patient cannot reach his nails.These nails are painful walking and wearing shoes.  This patient presents for at risk foot care today.  General Appearance  Alert, conversant and in no acute stress.  Vascular  Dorsalis pedis and posterior tibial  pulses are palpable  bilaterally.  Capillary return is within normal limits  bilaterally. Temperature is within normal limits  bilaterally.  Neurologic  Senn-Weinstein monofilament wire test within normal limits  bilaterally. Muscle power within normal limits bilaterally.  Nails Thick disfigured discolored nails with subungual debris  hallux nails  bilaterally. No evidence of bacterial infection or drainage bilaterally.  Orthopedic  No limitations of motion  feet .  No crepitus or effusions noted.  No bony pathology or digital deformities noted.  HAV  right foot.  Skin  normotropic skin with no porokeratosis noted bilaterally.  No signs of infections or ulcers noted.     Onychomycosis  Pain in right toes  Pain in left toes  Consent was obtained for treatment procedures.   Mechanical debridement of nails 1-5  bilaterally performed with a nail nipper.  Filed with dremel without incident.    Return office visit  3 months                    Told patient to return for periodic foot care and evaluation due to potential at risk complications.   Gardiner Barefoot DPM

## 2021-06-18 ENCOUNTER — Other Ambulatory Visit: Payer: Self-pay | Admitting: Psychiatry

## 2021-06-18 DIAGNOSIS — M545 Low back pain, unspecified: Secondary | ICD-10-CM

## 2021-07-07 ENCOUNTER — Other Ambulatory Visit: Payer: Self-pay | Admitting: Psychiatry

## 2021-07-07 DIAGNOSIS — M545 Low back pain, unspecified: Secondary | ICD-10-CM

## 2021-07-14 ENCOUNTER — Other Ambulatory Visit: Payer: Self-pay | Admitting: Psychiatry

## 2021-07-14 DIAGNOSIS — M545 Low back pain, unspecified: Secondary | ICD-10-CM

## 2021-08-07 ENCOUNTER — Other Ambulatory Visit: Payer: Self-pay | Admitting: Psychiatry

## 2021-08-07 DIAGNOSIS — F5105 Insomnia due to other mental disorder: Secondary | ICD-10-CM

## 2021-08-07 DIAGNOSIS — F99 Mental disorder, not otherwise specified: Secondary | ICD-10-CM

## 2021-08-12 ENCOUNTER — Other Ambulatory Visit: Payer: Self-pay | Admitting: Psychiatry

## 2021-08-12 DIAGNOSIS — F5105 Insomnia due to other mental disorder: Secondary | ICD-10-CM

## 2021-08-16 ENCOUNTER — Ambulatory Visit: Payer: Medicare Other | Admitting: Psychiatry

## 2021-09-07 ENCOUNTER — Other Ambulatory Visit: Payer: Self-pay

## 2021-09-07 ENCOUNTER — Ambulatory Visit: Payer: Medicare Other | Admitting: Podiatry

## 2021-09-07 ENCOUNTER — Encounter: Payer: Self-pay | Admitting: Podiatry

## 2021-09-07 DIAGNOSIS — M79674 Pain in right toe(s): Secondary | ICD-10-CM

## 2021-09-07 DIAGNOSIS — G629 Polyneuropathy, unspecified: Secondary | ICD-10-CM | POA: Diagnosis not present

## 2021-09-07 DIAGNOSIS — B351 Tinea unguium: Secondary | ICD-10-CM

## 2021-09-07 DIAGNOSIS — M79675 Pain in left toe(s): Secondary | ICD-10-CM

## 2021-09-07 MED ORDER — NONFORMULARY OR COMPOUNDED ITEM: 6 refills | Status: DC

## 2021-09-07 NOTE — Addendum Note (Signed)
Addended by: Rip Harbour on: 09/07/2021 08:55 AM   Modules accepted: Orders

## 2021-09-07 NOTE — Progress Notes (Signed)
This patient returns to my office for at risk foot care.  This patient requires this care by a professional since this patient will be at risk due to having neuropathy.  Patient is taking neurontin. Patient was referred to this office by Dr.  Harrington Challenger.  Patient says he experiences weakness when walking which he says is due to neuropathy. This patient is unable to cut nails himself since the patient cannot reach his nails.These nails are painful walking and wearing shoes.  This patient presents for at risk foot care today.  General Appearance  Alert, conversant and in no acute stress.  Vascular  Dorsalis pedis and posterior tibial  pulses are palpable  bilaterally.  Capillary return is within normal limits  bilaterally. Temperature is within normal limits  bilaterally.  Neurologic  Senn-Weinstein monofilament wire test within normal limits  bilaterally. Muscle power within normal limits bilaterally.  Nails Thick disfigured discolored nails with subungual debris  hallux nails  bilaterally. No evidence of bacterial infection or drainage bilaterally.  Orthopedic  No limitations of motion  feet .  No crepitus or effusions noted.  No bony pathology or digital deformities noted.  HAV  right foot.  Skin  normotropic skin with no porokeratosis noted bilaterally.  No signs of infections or ulcers noted.     Onychomycosis  Pain in right toes  Pain in left toes  Consent was obtained for treatment procedures.   Mechanical debridement of nails 1-5  bilaterally performed with a nail nipper.  Filed with dremel without incident.  Prescribed compunde medicine for his neuropathy.   Return office visit  3 months                    Told patient to return for periodic foot care and evaluation due to potential at risk complications.   Gardiner Barefoot DPM

## 2021-09-09 ENCOUNTER — Other Ambulatory Visit: Payer: Self-pay | Admitting: Psychiatry

## 2021-09-09 DIAGNOSIS — F5105 Insomnia due to other mental disorder: Secondary | ICD-10-CM

## 2021-09-09 DIAGNOSIS — F99 Mental disorder, not otherwise specified: Secondary | ICD-10-CM

## 2021-09-13 DIAGNOSIS — J42 Unspecified chronic bronchitis: Secondary | ICD-10-CM | POA: Diagnosis not present

## 2021-09-19 ENCOUNTER — Other Ambulatory Visit: Payer: Self-pay | Admitting: Psychiatry

## 2021-09-19 DIAGNOSIS — F5105 Insomnia due to other mental disorder: Secondary | ICD-10-CM

## 2021-10-03 ENCOUNTER — Encounter: Payer: Self-pay | Admitting: Psychiatry

## 2021-10-03 ENCOUNTER — Encounter: Payer: Medicare Other | Admitting: Psychiatry

## 2021-10-03 ENCOUNTER — Other Ambulatory Visit: Payer: Self-pay

## 2021-10-03 NOTE — Progress Notes (Signed)
Pt not seen This encounter was created in error - please disregard.

## 2021-10-05 NOTE — Progress Notes (Deleted)
HPI ?male never smoker followed for cough, mild obstructive airways disease, complicated by CAD, palpitations, old right rib fractures ?PFT 07/13/14- minimal obstructive airways disease, insignificant response to bronchodilator, normal lung volumes and diffusion. ?Echocardiogram 08/11/2013-normal LV function, normal valves, no evidence of prior MI ?Walk Test-04/02/2015-he walked 555 feet starting with saturation 93%, falling to a nadir of 92% on room air with no oxygen desaturation. Pulse rate 100-109, regular. ?ONOX- 04/25/15- qualified for sleep O2 with 50 minutes<= 88% room air ?Marland KitchenNPSG 09/29/15- WNL, AHI 3/ hr, with desat to 85%, mean sat 90.1% ?----------------------------------------------------------------------l ? ?10/07/20- 76 year old male never smoker followed for Chronic hypoxic respiratory / nocturnal hypoxemia, cough, mild Obstructive Airways Disease, old right rib fractures complicated by CAD, palpitations, Insomnia/ Anxiet/ ETOH/ Dr [name], DM2, Hyperlipidemia, Neuropathy ?O2 2 L/sleep/[facility]  ?Covid vax-3 Phizer ?Flu vax-had ?On gabapentin and metformin for prediabetes with neuropathy. Has fallen, and comes wearing bandage R calf. ?Also has hydroxyzine, lorazepam which help with sleep. ?No changes in breathing during the day- denies cough, wheeze. Continues to wear oxygen for sleep and tells me he wears it most of each night. ?CXR 10/08/19- ?IMPRESSION: ?No acute abnormality of the lungs. ? ?10/07/21- 76 year old male never smoker followed for Chronic hypoxic respiratory / nocturnal hypoxemia, cough, mild Obstructive Airways Disease, old right rib fractures complicated by CAD, palpitations, Insomnia/ Anxiet/ ETOH/ Dr [name], DM2, Hyperlipidemia, Neuropathy ?O2 2 L/sleep/[facility]  ?Covid vax-3 Phizer ?Flu vax- ? ?CXR 10/31/20- ?FINDINGS: ?Low volume chest with mild atelectatic type opacity at the bases. ?There is no edema, consolidation, effusion, or pneumothorax. Normal ?heart size and mediastinal  contours. ?IMPRESSION: ?Low volume chest with mild atelectasis. ? ?ROS-see HPI  + = positive ?Constitutional:   No-   weight loss, night sweats, fevers, chills, fatigue, lassitude. ?HEENT:   No-  headaches, difficulty swallowing, tooth/dental problems, sore throat,  ?     No-  sneezing, itching, ear ache, nasal congestion, post nasal drip,  ?CV:  No-   chest pain, orthopnea, PND, swelling in lower extremities, anasarca,    ?                          dizziness, +palpitations ?Resp: +shortness of breath with exertion or at rest.   ?           No-   productive cough,  non-productive cough,  No- coughing up of blood.   ?           No-   change in color of mucus.  No- wheezing.   ?Skin: No-   rash or lesions. ?GI:  No-   heartburn, indigestion, abdominal pain, nausea, vomiting,  ?GU: . ?MS:  No-   joint pain or swelling.   ?Neuro-     + numbness/ peripheral neuropathy ?Psych:  No- change in mood or affect. + depression or anxiety.  No memory loss. ? ?OBJ- Physical Exam    ?General- Alert, Oriented, Affect-appropriate, Distress- none acute, not obese ?Skin- rash-none, lesions- none, excoriation- none ?Lymphadenopathy- none ?Head- atraumatic ?           Eyes- Gross vision intact, PERRLA, conjunctivae and secretions clear ?           Ears- Hearing, canals-normal ?           Nose- Clear, no-Septal dev, mucus, polyps, erosion, perforation  ?           Throat- Mallampati II/+ uvulopalatoplasty , mucosa clear , drainage- none, tonsils-  atrophic,  ?  Neck- flexible , trachea midline, no stridor , thyroid nl, carotid no bruit ?Chest - symmetrical excursion , unlabored ?          Heart/CV- RRR , no murmur , no gallop  , no rub, nl s1 s2 ?                          - JVD- none , edema- none, stasis changes- none, varices- none ?          Lung- +clear, wheeze- none, cough- none , dullness-none, rub- none ?          Chest wall-; + asymmetry chest wall at sternum consistent with old rib fractures and rib cage distortion. ?Abd-  ?Br/  Gen/ Rectal- Not done, not indicated ?Extrem- + bandage R calf ?Neuro- unremarkable to observation, no tremor ? ? ? ?

## 2021-10-07 ENCOUNTER — Other Ambulatory Visit: Payer: Self-pay | Admitting: Psychiatry

## 2021-10-07 ENCOUNTER — Ambulatory Visit: Payer: Medicare Other | Admitting: Internal Medicine

## 2021-10-07 DIAGNOSIS — F5105 Insomnia due to other mental disorder: Secondary | ICD-10-CM

## 2021-10-07 DIAGNOSIS — F99 Mental disorder, not otherwise specified: Secondary | ICD-10-CM

## 2021-10-11 DIAGNOSIS — J42 Unspecified chronic bronchitis: Secondary | ICD-10-CM | POA: Diagnosis not present

## 2021-10-13 ENCOUNTER — Other Ambulatory Visit: Payer: Self-pay | Admitting: Psychiatry

## 2021-10-13 DIAGNOSIS — F99 Mental disorder, not otherwise specified: Secondary | ICD-10-CM

## 2021-10-26 ENCOUNTER — Ambulatory Visit: Payer: Medicare Other | Admitting: Psychiatry

## 2021-11-07 ENCOUNTER — Other Ambulatory Visit: Payer: Self-pay | Admitting: Psychiatry

## 2021-11-07 DIAGNOSIS — F5105 Insomnia due to other mental disorder: Secondary | ICD-10-CM

## 2021-11-10 ENCOUNTER — Ambulatory Visit: Payer: Medicare Other | Admitting: Internal Medicine

## 2021-11-11 DIAGNOSIS — J42 Unspecified chronic bronchitis: Secondary | ICD-10-CM | POA: Diagnosis not present

## 2021-12-06 DIAGNOSIS — E1140 Type 2 diabetes mellitus with diabetic neuropathy, unspecified: Secondary | ICD-10-CM | POA: Diagnosis not present

## 2021-12-06 DIAGNOSIS — G629 Polyneuropathy, unspecified: Secondary | ICD-10-CM | POA: Diagnosis not present

## 2021-12-06 DIAGNOSIS — R2689 Other abnormalities of gait and mobility: Secondary | ICD-10-CM | POA: Diagnosis not present

## 2021-12-06 DIAGNOSIS — E782 Mixed hyperlipidemia: Secondary | ICD-10-CM | POA: Diagnosis not present

## 2021-12-06 DIAGNOSIS — E559 Vitamin D deficiency, unspecified: Secondary | ICD-10-CM | POA: Diagnosis not present

## 2021-12-06 DIAGNOSIS — I10 Essential (primary) hypertension: Secondary | ICD-10-CM | POA: Diagnosis not present

## 2021-12-07 DIAGNOSIS — L821 Other seborrheic keratosis: Secondary | ICD-10-CM | POA: Diagnosis not present

## 2021-12-07 DIAGNOSIS — L570 Actinic keratosis: Secondary | ICD-10-CM | POA: Diagnosis not present

## 2021-12-07 DIAGNOSIS — D692 Other nonthrombocytopenic purpura: Secondary | ICD-10-CM | POA: Diagnosis not present

## 2021-12-07 DIAGNOSIS — L812 Freckles: Secondary | ICD-10-CM | POA: Diagnosis not present

## 2021-12-11 DIAGNOSIS — J42 Unspecified chronic bronchitis: Secondary | ICD-10-CM | POA: Diagnosis not present

## 2021-12-14 ENCOUNTER — Ambulatory Visit: Payer: Medicare Other | Admitting: Psychiatry

## 2021-12-16 ENCOUNTER — Ambulatory Visit: Payer: Medicare Other | Admitting: Internal Medicine

## 2021-12-21 DIAGNOSIS — I10 Essential (primary) hypertension: Secondary | ICD-10-CM | POA: Diagnosis not present

## 2021-12-21 DIAGNOSIS — Z0000 Encounter for general adult medical examination without abnormal findings: Secondary | ICD-10-CM | POA: Diagnosis not present

## 2021-12-21 DIAGNOSIS — E559 Vitamin D deficiency, unspecified: Secondary | ICD-10-CM | POA: Diagnosis not present

## 2021-12-21 DIAGNOSIS — E1140 Type 2 diabetes mellitus with diabetic neuropathy, unspecified: Secondary | ICD-10-CM | POA: Diagnosis not present

## 2021-12-21 DIAGNOSIS — E782 Mixed hyperlipidemia: Secondary | ICD-10-CM | POA: Diagnosis not present

## 2021-12-28 ENCOUNTER — Encounter: Payer: Self-pay | Admitting: Psychiatry

## 2021-12-28 ENCOUNTER — Ambulatory Visit (INDEPENDENT_AMBULATORY_CARE_PROVIDER_SITE_OTHER): Payer: Medicare Other | Admitting: Psychiatry

## 2021-12-28 DIAGNOSIS — F99 Mental disorder, not otherwise specified: Secondary | ICD-10-CM

## 2021-12-28 DIAGNOSIS — F5105 Insomnia due to other mental disorder: Secondary | ICD-10-CM | POA: Diagnosis not present

## 2021-12-28 DIAGNOSIS — F331 Major depressive disorder, recurrent, moderate: Secondary | ICD-10-CM | POA: Diagnosis not present

## 2021-12-28 DIAGNOSIS — F1029 Alcohol dependence with unspecified alcohol-induced disorder: Secondary | ICD-10-CM

## 2021-12-28 DIAGNOSIS — F411 Generalized anxiety disorder: Secondary | ICD-10-CM | POA: Diagnosis not present

## 2021-12-28 DIAGNOSIS — G629 Polyneuropathy, unspecified: Secondary | ICD-10-CM

## 2021-12-28 MED ORDER — DULOXETINE HCL 30 MG PO CPEP: ORAL_CAPSULE | ORAL | 0 refills | Status: DC

## 2021-12-28 NOTE — Progress Notes (Signed)
Eduardo Watts [id number] 17-Mar-1946 76 y.o.   Subjective:   Patient ID:  Eduardo Watts. is a 76 y.o. (DOB 1945/11/13) male.  Chief Complaint:  Chief Complaint  Patient presents with   Follow-up   Anxiety   Stress    neuropathy   Depression    Anxiety Symptoms include dizziness. Patient reports no chest pain, confusion, decreased concentration, nervous/anxious behavior, palpitations or suicidal ideas.   Eduardo Watts. Eduardo Watts presents to the office today for follow-up of sleep, anxiety and alcohol abuse.  seen 11/2019.  No meds changed.  He was not significantly depressed or anxious but was needing medication for insomnia. Meds: Flexeril 10 HS, Gabapentin 300 HS for neuropathy helps a little, lorazepam 0.'5mg'$  2 HS,   sleeps good with that.  05/06/20 appt noted: CO neuropathy in feet with numbness and tingling in feet.   Seeing neurologist.  No change in meds so far.  He's prediabetic. This is causing a balance issue.   Sleep well with lorazepam.  Vaccinated. Plan: no changes  08/26/2020 appt noted: [name] Dizziness when gets up in the AM.  Neuropathy problems.  Increased gabapentin which he seemed to tolerate.  Fall the other day.  Energy not as good.  Lorazepam & hydroxyzine still helps sleep.  Celebrated 54 year anniversary party.  Good time.    Wife concerned about his meds and wonders if they are affecting his meds.    02/17/2021 appt noted: Neuropathy worse.  Tried gabapentin without help.  Balance is off badly.  Can't shower standing up.  Not much pain.  On Lyrica.   Sleep well with meds about 6 hours.  Can get up at night to bedtime without problems.   Drinking a bottle of wine per day Wife concerned about his alcohol.  Doesn't want to attend AA bc of having to talk in public over it. Patient reports stable mood and denies depressed or irritable moods usually.  Patient denies any recent difficulty with anxiety.  Patient denies difficulty with sleep  initiation or maintenance. Ativan 1 mg and hydroxyzine help sleep.  Some mild EMA.  8 hours.   Denies appetite disturbance.  Patient reports that appetite,  and motivation have been good.  Patient denies any difficulty with concentration.  Patient denies any suicidal ideation.  Occ name recall problems.  12/28/21 appt noted: CC neuropathy with several consults. Neuropathy causing depression. F died 2 and 1/2 years ago. Numbness, stinging, loss of balance esp in shower. Loss of interests, reduced interests reading, Bible, church.  Thinking about best days behind him.  Past Psychiatric Medication Trials:  Ambien felll out of bed, Ativan, Flexeril, Belsomra, hydrozyzine, Xanax hives, Prosom, trazodone NR, No Seroquel DT B 's suicide, cyclobenzaprine helps,  Klonopin SE, Librium detox,  Gabapentin SE, Lyrica NR Lithium,  fluoxetine,  topiramate, propranolol, Naltrexone   Review of Systems:  Review of Systems  Cardiovascular:  Negative for chest pain and palpitations.  Neurological:  Positive for dizziness and numbness. Negative for tremors and weakness.       Neuropathy in feet affecting  balance  Psychiatric/Behavioral:  Positive for dysphoric mood. Negative for agitation, behavioral problems, confusion, decreased concentration, hallucinations, self-injury, sleep disturbance and suicidal ideas. The patient is not nervous/anxious and is not hyperactive.    Medications: I have reviewed the patient's current medications.  Current Outpatient Medications  Medication Sig Dispense Refill   acetaminophen (TYLENOL) 325 MG tablet Take 2 tablets (650 mg total) by mouth every  6 (six) hours as needed for mild pain (or Fever >/= 101). 20 tablet 0   alfuzosin (UROXATRAL) 10 MG 24 hr tablet Take 10 mg by mouth daily.     B Complex Vitamins (B COMPLEX PO) Take 1 tablet by mouth once a week.     CALCIUM PO Take 1 tablet by mouth at bedtime.     Cholecalciferol (VITAMIN D3) 5000 units CAPS Take 1 capsule by  mouth at bedtime.     clotrimazole-betamethasone (LOTRISONE) cream Apply topically.     cyclobenzaprine (FLEXERIL) 10 MG tablet TAKE 1 TABLET BY MOUTH AT BEDTIME 90 tablet 1   ezetimibe (ZETIA) 10 MG tablet Take 1 tablet (10 mg total) by mouth daily. (Patient taking differently: Take 10 mg by mouth at bedtime.) 30 tablet 11   finasteride (PROSCAR) 5 MG tablet Take 5 mg by mouth at bedtime. Take 1 tablet by mouth every other night     fluticasone (FLONASE) 50 MCG/ACT nasal spray Place 1 spray into both nostrils daily.      folic acid (FOLVITE) 1 MG tablet Take 1 tablet (1 mg total) by mouth daily. 30 tablet 0   hydrocortisone cream 1 % Apply topically 2 (two) times daily. 30 g 0   hydrOXYzine (ATARAX) 10 MG tablet TAKE 2 TABLETS BY MOUTH THREE TIMES DAILY AS NEEDED FOR ANXIETY 180 tablet 0   lidocaine (LIDODERM) 5 % Place 1 patch onto the skin daily. Remove & Discard patch within 12 hours or as directed by MD 30 patch 0   LORazepam (ATIVAN) 0.5 MG tablet TAKE 2 TABLETS BY MOUTH AT BEDTIME 60 tablet 1   Magnesium 100 MG CAPS Take 1 capsule by mouth at bedtime.     Multiple Vitamin (MULTIVITAMIN WITH MINERALS) TABS tablet Take 1 tablet by mouth daily. 30 tablet 0   NONFORMULARY OR COMPOUNDED ITEM Compound Cream for Neuropathy 1 each 6   Omega-3 Fatty Acids (FISH OIL) 1000 MG CAPS Take 1 capsule by mouth once a week.     polyethylene glycol (MIRALAX / GLYCOLAX) 17 g packet Take 17 g by mouth daily as needed for mild constipation. 14 each 0   rosuvastatin (CRESTOR) 5 MG tablet Take 5 mg by mouth 2 (two) times a week.     sildenafil (REVATIO) 20 MG tablet Take 20 mg by mouth daily as needed (ed).     thiamine 100 MG tablet Take 1 tablet (100 mg total) by mouth daily. 30 tablet 0   zinc sulfate 220 (50 Zn) MG capsule Take 220 mg by mouth every evening.     carvedilol (COREG) 3.125 MG tablet Take 3.125 mg by mouth at bedtime. (Patient not taking: Reported on 12/28/2021)     gabapentin (NEURONTIN) 300 MG  capsule Take 600 mg by mouth at bedtime. (Patient not taking: Reported on 05/05/2021)     pregabalin (LYRICA) 100 MG capsule Take 100 mg by mouth 3 (three) times daily. (Patient not taking: Reported on 12/28/2021)     No current facility-administered medications for this visit.    Medication Side Effects: None  Allergies:  Allergies  Allergen Reactions   Zocor [Simvastatin] Other (See Comments)    Muscle aches   Trazodone And Nefazodone Other (See Comments)    "crazy"    Past Medical History:  Diagnosis Date   Anxiety    Asthma    as a child   Depression    sees Dr [name]   History of BPH    Dr.Dahlstedt  Hyperlipidemia    Prediabetes    Sleep disturbances     Family History  Problem Relation Age of Onset   Dementia Mother    Stroke Mother    Cancer - Prostate Father    Melanoma Father    Hyperlipidemia Sister        high cholestrol   Depression Brother    Suicidality Brother    Lung disease Maternal Grandfather    Heart attack Neg Hx    Hypertension Neg Hx     Social History   Socioeconomic History   Marital status: Married    Spouse name: Not on file   Number of children: 1   Years of education: 77   Highest education level: Not on file  Occupational History   Not on file  Tobacco Use   Smoking status: Never   Smokeless tobacco: Never  Vaping Use   Vaping Use: Never used  Substance and Sexual Activity   Alcohol use: Yes    Alcohol/week: 14.0 - 21.0 standard drinks    Types: 14 - 21 Glasses of wine per week   Drug use: No   Sexual activity: Not on file  Other Topics Concern   Not on file  Social History Narrative   Never smoked   Alcohol yes -2-3 glasses of wine/night   Caffeine- yes 1 cup of coffee/day   Recreational drug use -No   Diet- fruits and vegetables,less fried food more pasta   Exercise- decrease to fatigue ,does yard work,and  walks occasionally    Occupation -employed Press photographer   Married 1 daughter/ 1 granddaughter   Goes by [name]"  loves animals   Social Determinants of Health   Financial Resource Strain: Not on file  Food Insecurity: Not on file  Transportation Needs: Not on file  Physical Activity: Not on file  Stress: Not on file  Social Connections: Not on file  Intimate Partner Violence: Not on file    Past Medical History, Surgical history, Social history, and Family history were reviewed and updated as appropriate.   Please see review of systems for further details on the patient's review from today.   Objective:   Physical Exam:  There were no vitals taken for this visit.  Physical Exam Constitutional:      General: He is not in acute distress.    Appearance: He is well-developed.  Musculoskeletal:        General: No deformity.  Neurological:     Mental Status: He is alert and oriented to person, place, and time.     Motor: No tremor.     Coordination: Coordination abnormal.     Gait: Gait abnormal.  Psychiatric:        Attention and Perception: Attention and perception normal.        Mood and Affect: Mood is depressed. Mood is not anxious. Affect is not labile, blunt, angry or inappropriate.        Speech: Speech normal. Speech is not rapid and pressured.        Behavior: Behavior normal.        Thought Content: Thought content normal. Thought content is not delusional. Thought content does not include homicidal or suicidal ideation. Thought content does not include suicidal plan.        Cognition and Memory: Cognition normal.        Judgment: Judgment normal.     Comments: Insight intact. No auditory or visual hallucinations. No delusions.  More stressed.  Lab Review:     Component Value Date/Time   NA 133 (L) 10/31/2020 0528   NA 136 05/24/2017 0845   K 4.5 10/31/2020 0528   CL 101 10/31/2020 0528   CO2 21 (L) 10/31/2020 0528   GLUCOSE 112 (H) 10/31/2020 0528   BUN 19 10/31/2020 0528   BUN 11 05/24/2017 0845   CREATININE 1.02 10/31/2020 0528   CALCIUM 8.8 (L) 10/31/2020  0528   PROT 6.7 10/31/2020 0528   PROT 7.0 04/15/2019 0854   ALBUMIN 3.7 10/31/2020 0528   ALBUMIN 4.5 05/24/2017 0845   AST 27 10/31/2020 0528   ALT 27 10/31/2020 0528   ALKPHOS 86 10/31/2020 0528   BILITOT 0.7 10/31/2020 0528   BILITOT 1.1 05/24/2017 0845   GFRNONAA >60 10/31/2020 0528   GFRAA 99 05/24/2017 0845       Component Value Date/Time   WBC 6.3 10/31/2020 0528   RBC 4.37 10/31/2020 0528   HGB 13.8 10/31/2020 0528   HCT 42.6 10/31/2020 0528   PLT 190 10/31/2020 0528   MCV 97.5 10/31/2020 0528   MCH 31.6 10/31/2020 0528   MCHC 32.4 10/31/2020 0528   RDW 12.1 10/31/2020 0528   LYMPHSABS 0.8 10/31/2020 0528   MONOABS 0.7 10/31/2020 0528   EOSABS 0.2 10/31/2020 0528   BASOSABS 0.0 10/31/2020 0528    No results found for: POCLITH, LITHIUM   No results found for: PHENYTOIN, PHENOBARB, VALPROATE, CBMZ   .res Assessment: Plan:    Major depressive disorder, recurrent episode, moderate (HCC)  Generalized anxiety disorder  Insomnia due to other mental disorder   Disc sleep med options including no meds.  Wife complains about him taking sleep meds.  Chronic insomnia otherwise. Needs the meds. Takes Flexeril every other day and doesn't take hydroxyzine on that night alternates them. Tolerating meds.  Answered questions about diagnosis and prognosis.  Disc this at length.  He has well controled psych sx.   Answered questions about purpose of each med.  We discussed the short-term risks associated with benzodiazepines including sedation and increased fall risk among others.  Discussed long-term side effect risk including dependence, potential withdrawal symptoms, and the potential eventual dose-related risk of dementia.  Newer studies refute the risk Also discussed the consequences of poor sleep on brain and body health.  Also some risk from hydroxyzine also causing short-term problems.  He's doing well without SE so no changes.    Sleep hygiene. Disc timing of sleep  meds to help him stay asleep.  Disc option increase hydroxyzine and hold flexeril or vice versa.  Flexeril helps back pain.  Lorazepam helps sleep as well.  Manages well with 6 hours of sleep.  Disc risk of multiple sleepers.  Overall he's doing well and sleep meds due not appear to be cause of sleepiness.  But discuss the reasons for it.  Extensive discussion of distinguishing dizziness related HS meds vs orthostatic.vs neuropathy.  Disc importance of managing glucose to reduce rate of progression of neuropathy.  The following offer intensive outpatient programs: [facility] intensive outpatient program-available through the main [facility] phone number and ask for [place] [facility], he continues to go and it's better.  He is more depressed nor markedly anxious at this time.  He does need the medications for insomnia as noted.  Sleep better Lorazepam 1 mg and hydroxyzine 20 mg HS  For neuropathy and pain:  duloxetine 30 then 60 daily  FU 6-8 weeks.  Lynder Parents, MD,  DFAPA     Please see After Visit Summary for patient specific instructions.  Future Appointments  Date Time Provider [place]  01/31/2022  9:00 AM Deneise Lever, MD [facility] None  03/08/2022  8:15 AM Gardiner Barefoot, DPM [facility]    No orders of the defined types were placed in this encounter.     -------------------------------

## 2022-01-03 ENCOUNTER — Other Ambulatory Visit: Payer: Self-pay | Admitting: Psychiatry

## 2022-01-03 DIAGNOSIS — F5105 Insomnia due to other mental disorder: Secondary | ICD-10-CM

## 2022-01-05 ENCOUNTER — Telehealth: Payer: Self-pay | Admitting: Psychiatry

## 2022-01-05 NOTE — Telephone Encounter (Signed)
Call pt.  There is no reason duloxetine would cause falls or affect his drinking.  Call him and ask how he is tolerating duloxetine.  He just started it.  Hasn't had time to help.  Remind him of options for alcohol treatment that I listed in last progress note.

## 2022-01-05 NOTE — Telephone Encounter (Signed)
Called wife and she said since starting duloxetine patient has been drinking and falling daily. I asked patient if she could quantify how much patient was drinking. She said "he's a sneaker" and she is not sure how much he drinks, but does say it is too much. She said he had been drinking 2 bottles of alcohol a couple of years ago, but says now he is probably drinking a bottle a day. He is also on lorazepam.  Wife is worried that patient will hit his head.

## 2022-01-05 NOTE — Telephone Encounter (Signed)
Eduardo Watts's wife called. She is listed on his DPR. She says since he has been on the Cymbalta he has fallen everyday.  She states he has been bleeding all over the furniture and has been drinking since taking the Cymbalta. Please call his wife at [phone]. His next visit is 02/28/22.

## 2022-01-06 NOTE — Telephone Encounter (Signed)
LVM to RC - patient's number.   Called and spoke with wife and recommendations were given to her. Told her I had call patient's # to speak with him also.

## 2022-01-09 NOTE — Telephone Encounter (Signed)
Patient had called back but I was not available. I called him again and got his answering machine. I left message with my name given twice.

## 2022-01-10 NOTE — Telephone Encounter (Signed)
Called patient back. His wife was on speaker.  Patient had no questions and is not sure what he wants to do at this point - stop it or go with lower dose. Wife did not voice any questions. Patient said he would let us know what he decides to do.

## 2022-01-10 NOTE — Telephone Encounter (Signed)
Duloxetine was prescribed for neuropathy and depression.  It is not sedating and not a controlled substance and does not usually cause dizziness but dizziness is possible.  Low sodium levels are rare and in his case his sodium level is normal so that is not a problem with the Cymbalta.  She had may have read that it can cause low blood pressure but I have never seen that in 20 years from Cymbalta.  That particular side effect is highly unlikely.  However 1 can have dizziness that is unrelated to blood pressure or sodium and is possible he is having that.  If he wants to stop the Cymbalta he can just stop it bc he has not been taking it very long.  However if he wants to continue to try it for neuropathy we could prescribe a lower dose.  If he is having side effects from it then obviously a lower dose should reduce or eliminate the side effects.  Having neuropathy will increase his fall risk.  There are not many other options for treating neuropathy that he has not already tried.  But if despite this information he wants to stop it he can just stop

## 2022-01-10 NOTE — Telephone Encounter (Signed)
Wife called with husband on speaker. Wife states patient fell 3 times today already. Patient said he gets dizzy. He has balance issues but says they are worse with Cymbalta. He said his legs feel weak. He is not always able to sit down when he feels sx. He has h/o neuropathy, no longer on gabapentin. Wife said she read paperwork on Cymbalta and low BP and low sodium are potential issues. He was seen by PCP last month and wife said his sodium was low, but the lab value was within normal reference range.   Wife dominates the conversation. I asked patient how much he was drinking and he said 1/2 carafe of wine. Wife said he hasn't had anything to drink today.   Can he discontinue the duloxetine?   Hemoglobin A1c   2021-12-06    eAG 146      Hgb A1c 6.7   2.1-1.9  Comp Metabolic Panel   4173-03-20    Albumin 4.5   3.4-4.8  ALP 105   38-126  ALT 39   0-52  Anion Gap 17.7   6.0-20.0  AST 58   0-39  BUN 13   6-26  CO2 24   22-32  CA-corrected 9.59   8.60-10.30  Calcium 10.1   8.6-10.3  Chloride 100   98-107  Creatinine 1.07   0.60-1.30  eGFR2021 71   >60  Glucose 174   70-99  Potassium 4.4   3.5-5.5  Sodium 138   136-145  Protein, Total 7.5   6.0-8.3  TBIL 1.2   0.3-1.0  Microalbumin Panel   2021-12-06    MA/CR ratio <5.4   0.0-30.0  UCR 129      UMA <0.7      Vitamin B12   2021-12-06    B12 349   180-914  Vitamin D 25(OH) Total   2021-12-06    25(OH) Vit D, Total 75.1   30.0-100.0  Lipid Panel w/reflex   2021-12-06    LDL Chol Calc (NIH) 108   0-99  CHOL/HDL 3.0   2.0-4.0  Cholesterol 201   <200  HDLD 68   30-70  LDL Chol Calc (NIH) 108   0-99  NHDL 133   0-129  Triglyceride 147

## 2022-01-11 DIAGNOSIS — J42 Unspecified chronic bronchitis: Secondary | ICD-10-CM | POA: Diagnosis not present

## 2022-01-23 ENCOUNTER — Other Ambulatory Visit: Payer: Self-pay | Admitting: Psychiatry

## 2022-01-23 DIAGNOSIS — M545 Low back pain, unspecified: Secondary | ICD-10-CM

## 2022-01-29 ENCOUNTER — Other Ambulatory Visit: Payer: Self-pay | Admitting: Psychiatry

## 2022-01-29 DIAGNOSIS — G629 Polyneuropathy, unspecified: Secondary | ICD-10-CM

## 2022-01-29 DIAGNOSIS — F411 Generalized anxiety disorder: Secondary | ICD-10-CM

## 2022-01-29 DIAGNOSIS — F331 Major depressive disorder, recurrent, moderate: Secondary | ICD-10-CM

## 2022-01-31 ENCOUNTER — Ambulatory Visit: Payer: Medicare Other | Admitting: Internal Medicine

## 2022-01-31 ENCOUNTER — Encounter: Payer: Self-pay | Admitting: Internal Medicine

## 2022-01-31 DIAGNOSIS — G4734 Idiopathic sleep related nonobstructive alveolar hypoventilation: Secondary | ICD-10-CM

## 2022-01-31 DIAGNOSIS — H6121 Impacted cerumen, right ear: Secondary | ICD-10-CM | POA: Diagnosis not present

## 2022-01-31 DIAGNOSIS — H6120 Impacted cerumen, unspecified ear: Secondary | ICD-10-CM | POA: Insufficient documentation

## 2022-01-31 NOTE — Assessment & Plan Note (Signed)
Presumed mechanism hypoventilation.  Arrival saturation on room air 96% today when upright and awake. Plan-continue O2 2 L for sleep

## 2022-02-02 DIAGNOSIS — H6123 Impacted cerumen, bilateral: Secondary | ICD-10-CM | POA: Diagnosis not present

## 2022-02-02 DIAGNOSIS — H900 Conductive hearing loss, bilateral: Secondary | ICD-10-CM | POA: Diagnosis not present

## 2022-02-06 DIAGNOSIS — H04201 Unspecified epiphora, right lacrimal gland: Secondary | ICD-10-CM | POA: Diagnosis not present

## 2022-02-06 DIAGNOSIS — H52203 Unspecified astigmatism, bilateral: Secondary | ICD-10-CM | POA: Diagnosis not present

## 2022-02-06 DIAGNOSIS — E1136 Type 2 diabetes mellitus with diabetic cataract: Secondary | ICD-10-CM | POA: Diagnosis not present

## 2022-02-06 DIAGNOSIS — H25813 Combined forms of age-related cataract, bilateral: Secondary | ICD-10-CM | POA: Diagnosis not present

## 2022-02-06 DIAGNOSIS — H524 Presbyopia: Secondary | ICD-10-CM | POA: Diagnosis not present

## 2022-02-06 DIAGNOSIS — H5213 Myopia, bilateral: Secondary | ICD-10-CM | POA: Diagnosis not present

## 2022-02-10 DIAGNOSIS — J42 Unspecified chronic bronchitis: Secondary | ICD-10-CM | POA: Diagnosis not present

## 2022-02-28 ENCOUNTER — Encounter: Payer: Self-pay | Admitting: Psychiatry

## 2022-02-28 ENCOUNTER — Encounter: Payer: Medicare Other | Admitting: Psychiatry

## 2022-03-03 NOTE — Progress Notes (Signed)
This encounter was created in error - please disregard. Pt left DT 20 min wait.  He RS

## 2022-03-08 ENCOUNTER — Ambulatory Visit: Payer: Medicare Other | Admitting: Podiatry

## 2022-03-13 DIAGNOSIS — J42 Unspecified chronic bronchitis: Secondary | ICD-10-CM | POA: Diagnosis not present

## 2022-03-24 ENCOUNTER — Other Ambulatory Visit: Payer: Self-pay | Admitting: Psychiatry

## 2022-03-24 DIAGNOSIS — F5105 Insomnia due to other mental disorder: Secondary | ICD-10-CM

## 2022-03-29 ENCOUNTER — Other Ambulatory Visit: Payer: Self-pay | Admitting: Psychiatry

## 2022-03-29 DIAGNOSIS — F5105 Insomnia due to other mental disorder: Secondary | ICD-10-CM

## 2022-03-29 NOTE — Telephone Encounter (Signed)
Filled 7/28 appt 11/1

## 2022-04-13 DIAGNOSIS — L03113 Cellulitis of right upper limb: Secondary | ICD-10-CM | POA: Diagnosis not present

## 2022-04-13 DIAGNOSIS — J42 Unspecified chronic bronchitis: Secondary | ICD-10-CM | POA: Diagnosis not present

## 2022-05-10 ENCOUNTER — Ambulatory Visit: Payer: Medicare Other | Admitting: Podiatry

## 2022-05-10 DIAGNOSIS — M79675 Pain in left toe(s): Secondary | ICD-10-CM

## 2022-05-10 DIAGNOSIS — M79674 Pain in right toe(s): Secondary | ICD-10-CM

## 2022-05-10 DIAGNOSIS — G629 Polyneuropathy, unspecified: Secondary | ICD-10-CM

## 2022-05-10 DIAGNOSIS — B351 Tinea unguium: Secondary | ICD-10-CM

## 2022-05-10 NOTE — Progress Notes (Signed)
This patient returns to my office for at risk foot care.  This patient requires this care by a professional since this patient will be at risk due to having neuropathy.  Patient is taking neurontin. Patient was referred to this office by Dr.  Harrington Challenger.  Patient says he experiences weakness when walking which he says is due to neuropathy. This patient is unable to cut nails himself since the patient cannot reach his nails.These nails are painful walking and wearing shoes.  This patient presents for at risk foot care today.  General Appearance  Alert, conversant and in no acute stress.  Vascular  Dorsalis pedis and posterior tibial  pulses are palpable  bilaterally.  Capillary return is within normal limits  bilaterally. Temperature is within normal limits  bilaterally.  Neurologic  Senn-Weinstein monofilament wire test diminished  bilaterally. Muscle power within normal limits bilaterally.  Nails Thick disfigured discolored nails with subungual debris  hallux nails  bilaterally. No evidence of bacterial infection or drainage bilaterally.  Orthopedic  No limitations of motion  feet .  No crepitus or effusions noted.  No bony pathology or digital deformities noted.  HAV  right foot.  Skin  normotropic skin with no porokeratosis noted bilaterally.  No signs of infections or ulcers noted.     Onychomycosis  Pain in right toes  Pain in left toes  Consent was obtained for treatment procedures.   Mechanical debridement of nails 1-5  bilaterally performed with a nail nipper.  Filed with dremel without incident.  Prescribed compunde medicine for his neuropathy.  Patient is concerned about his falling due to neuropathy.  He has frequent falls. Presented a MBB for patient.   Return office visit  4 months                    Told patient to return for periodic foot care and evaluation due to potential at risk complications.   Gardiner Barefoot DPM

## 2022-05-13 DIAGNOSIS — J42 Unspecified chronic bronchitis: Secondary | ICD-10-CM | POA: Diagnosis not present

## 2022-05-24 NOTE — Progress Notes (Unsigned)
Cardiology Office Note   Date:  05/25/2022   ID:  Eduardo Watts., DOB November 06, 1945, MRN [id number]  PCP:  [name], MD    No chief complaint on file.  CAD  Wt Readings from Last 3 Encounters:  05/25/22 188 lb 6.4 oz (85.5 kg)  01/31/22 190 lb (86.2 kg)  05/05/21 203 lb (92.1 kg)       History of Present Illness: Eduardo Watts. is a 76 y.o. male   who had a heart cath in 9/15. This showed moderate RCA disease. There was no significant obstructive disease. His ejection fraction was low normal. He had a pulmonary eval for cough.  This is improved with less Coreg.     He was concerned in the past about AFib and stroke risk.  He felt that he had daily palpitation. He hit his chest to help the palpitations resolve.  Prior w/u for AFib has been negative.   He had given up alcohol since the day of the cath in 2015 but restarted alcohol in 2017.      He has an iron overload syndrome as well.    He started nighttime oxygen in 2017- but only uses it half the night, 2L/min.  He had another sleep study in 2016 and it was normal.  No need for it during the day. 94% during the day.  88% at night.  Prescribed by Dr. [name].   In the past, he had decreased his alcohol intake to 2 glasses a day, down from a bottle a day.  He has maintained this level.  He is working with Dr. [name] to decrease his cravings for alcohol.  Acampro was started for this.    He saw Dr. Harrington Challenger. Diagnosed with peripheral neuropathy.  Started on Gabapentin.    In Nov 2020, it was noted he was walking 90 minutes/week despite foot numbness.    Walking limited by the neuropathy pain. He has been to several different doctors. No improvement.  He has had some side effects with some of the medicines.    Since the last visit, Neuropathy continues to be his biggest issues.  Stationary bike was recommended in the past.  In 2023, he has fallen a few times.  Related to neuropathy.  Broke a rib on  occasion.  No syncope.    Denies : Chest pain. Dizziness. Leg edema. Nitroglycerin use. Orthopnea. Palpitations. Paroxysmal nocturnal dyspnea. Shortness of breath. Syncope.    He is trying to avoid a cane.  He has a stationary bike at home that he uses on occasion.   Denies : Chest pain. Dizziness. Leg edema. Nitroglycerin use. Orthopnea. Palpitations. Paroxysmal nocturnal dyspnea. Shortness of breath. Syncope.    Continues to use alcohol moderately.    Has a dog that is a good companion.   Past Medical History:  Diagnosis Date   Anxiety    Asthma    as a child   Depression    sees Dr [name]   History of BPH    Dr.Dahlstedt   Hyperlipidemia    Prediabetes    Sleep disturbances     Past Surgical History:  Procedure Laterality Date   LEFT HEART CATHETERIZATION WITH CORONARY ANGIOGRAM N/A 04/30/2014   Procedure: LEFT HEART CATHETERIZATION WITH CORONARY ANGIOGRAM;  Surgeon: [name], MD;  Location: [facility] CATH LAB;  Service: Cardiovascular;  Laterality: N/A;   NASAL SEPTUM SURGERY  1975   R cheek benign tumor  1992, again 1998.   TONSILLECTOMY       Current Outpatient Medications  Medication Sig Dispense Refill   acetaminophen (TYLENOL) 325 MG tablet Take 2 tablets (650 mg total) by mouth every 6 (six) hours as needed for mild pain (or Fever >/= 101). 20 tablet 0   alfuzosin (UROXATRAL) 10 MG 24 hr tablet Take 10 mg by mouth daily.     B Complex Vitamins (B COMPLEX PO) Take 1 tablet by mouth once a week.     CALCIUM PO Take 1 tablet by mouth at bedtime.     carvedilol (COREG) 3.125 MG tablet Take 3.125 mg by mouth at bedtime.     Cholecalciferol (VITAMIN D3) 5000 units CAPS Take 1 capsule by mouth at bedtime.     clotrimazole-betamethasone (LOTRISONE) cream Apply topically.     cyclobenzaprine (FLEXERIL) 10 MG tablet TAKE 1 TABLET BY MOUTH AT BEDTIME 90 tablet 0   DULoxetine (CYMBALTA) 30 MG capsule Take 1 capsule po q am x 1 week, then 2 capsules po q am 30  capsule 0   ezetimibe (ZETIA) 10 MG tablet Take 1 tablet (10 mg total) by mouth daily. (Patient taking differently: Take 10 mg by mouth at bedtime.) 30 tablet 11   fluticasone (FLONASE) 50 MCG/ACT nasal spray Place 1 spray into both nostrils daily.      folic acid (FOLVITE) 1 MG tablet Take 1 tablet (1 mg total) by mouth daily. 30 tablet 0   hydrocortisone cream 1 % Apply topically 2 (two) times daily. 30 g 0   hydrOXYzine (ATARAX) 10 MG tablet TAKE 2 TABLETS BY MOUTH THREE TIMES DAILY AS NEEDED FOR ANXIETY 180 tablet 0   lidocaine (LIDODERM) 5 % Place 1 patch onto the skin daily. Remove & Discard patch within 12 hours or as directed by MD 30 patch 0   LORazepam (ATIVAN) 0.5 MG tablet TAKE 2 TABLETS BY MOUTH AT BEDTIME 60 tablet 2   Magnesium 100 MG CAPS Take 1 capsule by mouth at bedtime.     Multiple Vitamin (MULTIVITAMIN WITH MINERALS) TABS tablet Take 1 tablet by mouth daily. 30 tablet 0   NONFORMULARY OR COMPOUNDED ITEM Compound Cream for Neuropathy 1 each 6   Omega-3 Fatty Acids (FISH OIL) 1000 MG CAPS Take 1 capsule by mouth once a week.     polyethylene glycol (MIRALAX / GLYCOLAX) 17 g packet Take 17 g by mouth daily as needed for mild constipation. 14 each 0   pregabalin (LYRICA) 100 MG capsule Take 100 mg by mouth 3 (three) times daily. 2 daily     rosuvastatin (CRESTOR) 5 MG tablet Take 5 mg by mouth 2 (two) times a week.     sildenafil (REVATIO) 20 MG tablet Take 20 mg by mouth daily as needed (ed).     thiamine 100 MG tablet Take 1 tablet (100 mg total) by mouth daily. 30 tablet 0   zinc sulfate 220 (50 Zn) MG capsule Take 220 mg by mouth every evening.     No current facility-administered medications for this visit.    Allergies:   Zocor [simvastatin] and Trazodone and nefazodone    Social History:  The patient  reports that he has never smoked. He has never used smokeless tobacco. He reports current alcohol use of about 14.0 - 21.0 standard drinks of alcohol per week. He  reports that he does not use drugs.   Family History:  The patient's family history includes Cancer - Prostate in his father; Dementia  in his mother; Depression in his brother; Hyperlipidemia in his sister; Lung disease in his maternal grandfather; Melanoma in his father; Stroke in his mother; Suicidality in his brother.    ROS:  Please see the history of present illness.   Otherwise, review of systems are positive for he reports a lifelong asymmetry to his chest wall with the left side of his sternum being more prominent than the right,, but states this is not new and this has been present since he was a child.   All other systems are reviewed and negative.    PHYSICAL EXAM: VS:  BP 130/70   Pulse 96   Ht '6\' 2"'$  (1.88 m)   Wt 188 lb 6.4 oz (85.5 kg)   SpO2 96%   BMI 24.19 kg/m  , BMI Body mass index is 24.19 kg/m. GEN: Well nourished, well developed, in no acute distress HEENT: normal Neck: no JVD, carotid bruits, or masses Cardiac: RRR; no murmurs, rubs, or gallops,no edema  Respiratory:  clear to auscultation bilaterally, normal work of breathing GI: soft, nontender, nondistended, + BS MS: no deformity or atrophy Skin: warm and dry, no rash Neuro:  Strength and sensation are intact Psych: euthymic mood, full affect   EKG:   The ekg ordered today demonstrates normal sinus rhythm, incomplete right bundle branch block pattern, T wave changes, unchanged from prior   Recent Labs: No results found for requested labs within last 365 days.   Lipid Panel    Component Value Date/Time   CHOL 224 (H) 05/24/2017 0845   TRIG 141 05/24/2017 0845   HDL 57 05/24/2017 0845   CHOLHDL 3.9 05/24/2017 0845   LDLCALC 139 (H) 05/24/2017 0845     Other studies Reviewed: Additional studies/ records that were reviewed today with results demonstrating: LDL 108.   ASSESSMENT AND PLAN:  CAD: Moderate by prior cath.  No angina.  Continue aggressive secondary prevention.  Tolerating  Zetia. Diabetes: Metformin was stopped due to GI issues.  A1C 6.7.  Whole food, plant-based diet.  High-fiber diet will be helpful. Alcohol use: Reports moderate alcohol use. Hyperlipidemia: Intolerant of Zocor.  Has been hesitant to try other statins due to the neuropathy. Abnormal ECG: Normal sinus rhythm, incomplete right bundle branch block, T wave inversions noted in the past. Neuropathy: This is his biggest complaint.  2+ PT and DP pulses noted bilaterally.  I do not think it to any type of blood flow issue.  I encouraged him to be careful to avoid falling.   Current medicines are reviewed at length with the patient today.  The patient concerns regarding his medicines were addressed.  The following changes have been made:  No change  Labs/ tests ordered today include:  No orders of the defined types were placed in this encounter.   Recommend 150 minutes/week of aerobic exercise Low fat, low carb, high fiber diet recommended  Disposition:   FU in 1 year   Signed, Larae Grooms, MD  05/25/2022 8:26 AM    [facility] [address], [place], [place]  [address] Phone: [phone]; Fax: [phone]

## 2022-05-25 ENCOUNTER — Encounter: Payer: Self-pay | Admitting: Interventional Cardiology

## 2022-05-25 ENCOUNTER — Ambulatory Visit: Payer: Medicare Other | Attending: Interventional Cardiology | Admitting: Interventional Cardiology

## 2022-05-25 VITALS — BP 130/70 | HR 96 | Ht 74.0 in | Wt 188.4 lb

## 2022-05-25 DIAGNOSIS — E782 Mixed hyperlipidemia: Secondary | ICD-10-CM

## 2022-05-25 DIAGNOSIS — Z789 Other specified health status: Secondary | ICD-10-CM

## 2022-05-25 DIAGNOSIS — I25119 Atherosclerotic heart disease of native coronary artery with unspecified angina pectoris: Secondary | ICD-10-CM

## 2022-05-25 DIAGNOSIS — I4510 Unspecified right bundle-branch block: Secondary | ICD-10-CM | POA: Diagnosis not present

## 2022-05-25 DIAGNOSIS — E1159 Type 2 diabetes mellitus with other circulatory complications: Secondary | ICD-10-CM | POA: Diagnosis not present

## 2022-05-25 DIAGNOSIS — F1090 Alcohol use, unspecified, uncomplicated: Secondary | ICD-10-CM

## 2022-05-25 NOTE — Patient Instructions (Signed)
Medication Instructions:  Your physician recommends that you continue on your current medications as directed. Please refer to the Current Medication list given to you today.  *If you need a refill on your cardiac medications before your next appointment, please call your pharmacy*   Lab Work: none If you have labs (blood work) drawn today and your tests are completely normal, you will receive your results only by: MyChart Message (if you have MyChart) OR A paper copy in the mail If you have any lab test that is abnormal or we need to change your treatment, we will call you to review the results.   Testing/Procedures: none   Follow-Up: At [facility], you and your health needs are our priority.  As part of our continuing mission to provide you with exceptional heart care, we have created designated Provider Care Teams.  These Care Teams include your primary Cardiologist (physician) and Advanced Practice Providers (APPs -  Physician Assistants and Nurse Practitioners) who all work together to provide you with the care you need, when you need it.  We recommend signing up for the patient portal called "MyChart".  Sign up information is provided on this After Visit Summary.  MyChart is used to connect with patients for Virtual Visits (Telemedicine).  Patients are able to view lab/test results, encounter notes, upcoming appointments, etc.  Non-urgent messages can be sent to your provider as well.   To learn more about what you can do with MyChart, go to [email or web address].    Your next appointment:   12 month(s)  The format for your next appointment:   In Person  Provider:   Jayadeep Varanasi, MD     Other Instructions    Important Information About Sugar       

## 2022-06-07 ENCOUNTER — Encounter: Payer: Self-pay | Admitting: Psychiatry

## 2022-06-07 ENCOUNTER — Ambulatory Visit (INDEPENDENT_AMBULATORY_CARE_PROVIDER_SITE_OTHER): Payer: Medicare Other | Admitting: Psychiatry

## 2022-06-07 DIAGNOSIS — Z638 Other specified problems related to primary support group: Secondary | ICD-10-CM

## 2022-06-07 DIAGNOSIS — F331 Major depressive disorder, recurrent, moderate: Secondary | ICD-10-CM

## 2022-06-07 DIAGNOSIS — F5105 Insomnia due to other mental disorder: Secondary | ICD-10-CM

## 2022-06-07 DIAGNOSIS — G629 Polyneuropathy, unspecified: Secondary | ICD-10-CM | POA: Diagnosis not present

## 2022-06-07 DIAGNOSIS — F99 Mental disorder, not otherwise specified: Secondary | ICD-10-CM

## 2022-06-07 DIAGNOSIS — F411 Generalized anxiety disorder: Secondary | ICD-10-CM

## 2022-06-07 MED ORDER — DULOXETINE HCL 60 MG PO CPEP: 60.0000 mg | ORAL_CAPSULE | Freq: Every day | ORAL | 0 refills | Status: DC

## 2022-06-07 NOTE — Progress Notes (Signed)
Eduardo Watts [id number] [birth year] 76 y.o.   Subjective:   Patient ID:  Eduardo Watts. is a 76 y.o. (DOB [birth year]) male.  Chief Complaint:  Chief Complaint  Patient presents with   Follow-up   Depression   Anxiety    Anxiety Symptoms include dizziness. Patient reports no chest pain, confusion, decreased concentration, nervous/anxious behavior, palpitations or suicidal ideas.    Depression        Associated symptoms include no decreased concentration and no suicidal ideas. Eduardo Watts. [name] presents to the office today for follow-up of sleep, anxiety and alcohol abuse.  seen 11/2019.  No meds changed.  He was not significantly depressed or anxious but was needing medication for insomnia. Meds: Flexeril 10 HS, Gabapentin 300 HS for neuropathy helps a little, lorazepam 0.'5mg'$  2 HS,   sleeps good with that.  05/06/20 appt noted: CO neuropathy in feet with numbness and tingling in feet.   Seeing neurologist.  No change in meds so far.  He's prediabetic. This is causing a balance issue.   Sleep well with lorazepam.  Vaccinated. Plan: no changes  08/26/2020 appt noted: [name] Dizziness when gets up in the AM.  Neuropathy problems.  Increased gabapentin which he seemed to tolerate.  Fall the other day.  Energy not as good.  Lorazepam & hydroxyzine still helps sleep.  Celebrated 61 year anniversary party.  Good time.    Wife concerned about his meds and wonders if they are affecting his meds.    02/17/2021 appt noted: Neuropathy worse.  Tried gabapentin without help.  Balance is off badly.  Can't shower standing up.  Not much pain.  On Lyrica.   Sleep well with meds about 6 hours.  Can get up at night to bedtime without problems.   Drinking a bottle of wine per day Wife concerned about his alcohol.  Doesn't want to attend AA bc of having to talk in public over it. Patient reports stable mood and denies depressed or irritable moods usually.  Patient denies  any recent difficulty with anxiety.  Patient denies difficulty with sleep initiation or maintenance. Ativan 1 mg and hydroxyzine help sleep.  Some mild EMA.  8 hours.   Denies appetite disturbance.  Patient reports that appetite,  and motivation have been good.  Patient denies any difficulty with concentration.  Patient denies any suicidal ideation.  Occ name recall problems.  12/28/21 appt noted: CC neuropathy with several consults. Neuropathy causing depression. F died 2 and 1/2 years ago. Numbness, stinging, loss of balance esp in shower. Loss of interests, reduced interests reading, Bible, church.  Thinking about best days behind him.  06/07/22 appt noted: Upset over [name] decisions.   She is their only child.   He and wife very upset.  Happened in the summer. 1 GD 6th grade.  D and ex-son-in-law share custody.   Never tried duloxetine 60 and no benefit with 30 mg daily for neuropathy. No SE.  Has had some falls DT neuropathy.  Cracked a rib and hurt arm.  Neuro work up was neg.    Past Psychiatric Medication Trials:  Ambien fell out of bed, Ativan, Flexeril, Belsomra, hydrozyzine, Xanax hives, Prosom, trazodone NR, No Seroquel DT B 's suicide, cyclobenzaprine helps,  Klonopin SE, Librium detox,  Gabapentin SE, Lyrica NR Lithium,  fluoxetine, duloxetine topiramate, propranolol, Naltrexone   Review of Systems:  Review of Systems  Cardiovascular:  Negative for chest pain and palpitations.  Neurological:  Positive  for dizziness and numbness. Negative for tremors.       Neuropathy in feet affecting  balance  Psychiatric/Behavioral:  Positive for depression and dysphoric mood. Negative for agitation, behavioral problems, confusion, decreased concentration, hallucinations, self-injury, sleep disturbance and suicidal ideas. The patient is not nervous/anxious and is not hyperactive.     Medications: I have reviewed the patient's current medications.  Current Outpatient Medications  Medication  Sig Dispense Refill   acetaminophen (TYLENOL) 325 MG tablet Take 2 tablets (650 mg total) by mouth every 6 (six) hours as needed for mild pain (or Fever >/= 101). 20 tablet 0   alfuzosin (UROXATRAL) 10 MG 24 hr tablet Take 10 mg by mouth daily.     B Complex Vitamins (B COMPLEX PO) Take 1 tablet by mouth once a week.     CALCIUM PO Take 1 tablet by mouth at bedtime.     carvedilol (COREG) 3.125 MG tablet Take 3.125 mg by mouth at bedtime.     Cholecalciferol (VITAMIN D3) 5000 units CAPS Take 1 capsule by mouth at bedtime.     clotrimazole-betamethasone (LOTRISONE) cream Apply topically.     cyclobenzaprine (FLEXERIL) 10 MG tablet TAKE 1 TABLET BY MOUTH AT BEDTIME 90 tablet 0   DULoxetine (CYMBALTA) 30 MG capsule Take 1 capsule po q am x 1 week, then 2 capsules po q am (Patient taking differently: 1 tab HS) 30 capsule 0   ezetimibe (ZETIA) 10 MG tablet Take 1 tablet (10 mg total) by mouth daily. (Patient taking differently: Take 10 mg by mouth at bedtime.) 30 tablet 11   fluticasone (FLONASE) 50 MCG/ACT nasal spray Place 1 spray into both nostrils daily.      folic acid (FOLVITE) 1 MG tablet Take 1 tablet (1 mg total) by mouth daily. 30 tablet 0   hydrocortisone cream 1 % Apply topically 2 (two) times daily. 30 g 0   hydrOXYzine (ATARAX) 10 MG tablet TAKE 2 TABLETS BY MOUTH THREE TIMES DAILY AS NEEDED FOR ANXIETY (Patient taking differently: 2 HS) 180 tablet 0   lidocaine (LIDODERM) 5 % Place 1 patch onto the skin daily. Remove & Discard patch within 12 hours or as directed by MD 30 patch 0   LORazepam (ATIVAN) 0.5 MG tablet TAKE 2 TABLETS BY MOUTH AT BEDTIME 60 tablet 2   Magnesium 100 MG CAPS Take 1 capsule by mouth at bedtime.     Multiple Vitamin (MULTIVITAMIN WITH MINERALS) TABS tablet Take 1 tablet by mouth daily. 30 tablet 0   NONFORMULARY OR COMPOUNDED ITEM Compound Cream for Neuropathy 1 each 6   Omega-3 Fatty Acids (FISH OIL) 1000 MG CAPS Take 1 capsule by mouth once a week.      polyethylene glycol (MIRALAX / GLYCOLAX) 17 g packet Take 17 g by mouth daily as needed for mild constipation. 14 each 0   pregabalin (LYRICA) 100 MG capsule Take 100 mg by mouth 3 (three) times daily. 2 HS     rosuvastatin (CRESTOR) 5 MG tablet Take 5 mg by mouth 2 (two) times a week.     sildenafil (REVATIO) 20 MG tablet Take 20 mg by mouth daily as needed (ed).     thiamine 100 MG tablet Take 1 tablet (100 mg total) by mouth daily. 30 tablet 0   zinc sulfate 220 (50 Zn) MG capsule Take 220 mg by mouth every evening.     No current facility-administered medications for this visit.    Medication Side Effects: None  Allergies:  Allergies  Allergen Reactions   Zocor [Simvastatin] Other (See Comments)    Muscle aches   Trazodone And Nefazodone Other (See Comments)    "crazy"    Past Medical History:  Diagnosis Date   Anxiety    Asthma    as a child   Depression    sees Dr [name]   History of BPH    Dr.Dahlstedt   Hyperlipidemia    Prediabetes    Sleep disturbances     Family History  Problem Relation Age of Onset   Dementia Mother    Stroke Mother    Cancer - Prostate Father    Melanoma Father    Hyperlipidemia Sister        high cholestrol   Depression Brother    Suicidality Brother    Lung disease Maternal Grandfather    Heart attack Neg Hx    Hypertension Neg Hx     Social History   Socioeconomic History   Marital status: Married    Spouse name: Not on file   Number of children: 1   Years of education: 52   Highest education level: Not on file  Occupational History   Not on file  Tobacco Use   Smoking status: Never   Smokeless tobacco: Never  Vaping Use   Vaping Use: Never used  Substance and Sexual Activity   Alcohol use: Yes    Alcohol/week: 14.0 - 21.0 standard drinks of alcohol    Types: 14 - 21 Glasses of wine per week   Drug use: No   Sexual activity: Not on file  Other Topics Concern   Not on file  Social History Narrative   Never  smoked   Alcohol yes -2-3 glasses of wine/night   Caffeine- yes 1 cup of coffee/day   Recreational drug use -No   Diet- fruits and vegetables,less fried food more pasta   Exercise- decrease to fatigue ,does yard work,and  walks occasionally    Occupation -employed Press photographer   Married 1 daughter/ 1 granddaughter   Goes by [name]" loves animals   Social Determinants of Health   Financial Resource Strain: Not on file  Food Insecurity: Not on file  Transportation Needs: Not on file  Physical Activity: Not on file  Stress: Not on file  Social Connections: Not on file  Intimate Partner Violence: Not on file    Past Medical History, Surgical history, Social history, and Family history were reviewed and updated as appropriate.   Please see review of systems for further details on the patient's review from today.   Objective:   Physical Exam:  There were no vitals taken for this visit.  Physical Exam Constitutional:      General: He is not in acute distress.    Appearance: He is well-developed.  Musculoskeletal:        General: No deformity.  Neurological:     Mental Status: He is alert and oriented to person, place, and time.     Motor: No tremor.     Coordination: Coordination abnormal.     Gait: Gait abnormal.  Psychiatric:        Attention and Perception: Attention and perception normal.        Mood and Affect: Mood is depressed. Mood is not anxious. Affect is not labile, blunt, angry or inappropriate.        Speech: Speech normal. Speech is not rapid and pressured.        Behavior: Behavior normal.  Thought Content: Thought content normal. Thought content is not delusional. Thought content does not include homicidal or suicidal ideation. Thought content does not include suicidal plan.        Cognition and Memory: Cognition normal.        Judgment: Judgment normal.     Comments: Insight intact. No auditory or visual hallucinations. No delusions.  More stressed.       Lab Review:     Component Value Date/Time   NA 133 (L) 10/31/2020 0528   NA 136 05/24/2017 0845   K 4.5 10/31/2020 0528   CL 101 10/31/2020 0528   CO2 21 (L) 10/31/2020 0528   GLUCOSE 112 (H) 10/31/2020 0528   BUN 19 10/31/2020 0528   BUN 11 05/24/2017 0845   CREATININE 1.02 10/31/2020 0528   CALCIUM 8.8 (L) 10/31/2020 0528   PROT 6.7 10/31/2020 0528   PROT 7.0 04/15/2019 0854   ALBUMIN 3.7 10/31/2020 0528   ALBUMIN 4.5 05/24/2017 0845   AST 27 10/31/2020 0528   ALT 27 10/31/2020 0528   ALKPHOS 86 10/31/2020 0528   BILITOT 0.7 10/31/2020 0528   BILITOT 1.1 05/24/2017 0845   GFRNONAA >60 10/31/2020 0528   GFRAA 99 05/24/2017 0845       Component Value Date/Time   WBC 6.3 10/31/2020 0528   RBC 4.37 10/31/2020 0528   HGB 13.8 10/31/2020 0528   HCT 42.6 10/31/2020 0528   PLT 190 10/31/2020 0528   MCV 97.5 10/31/2020 0528   MCH 31.6 10/31/2020 0528   MCHC 32.4 10/31/2020 0528   RDW 12.1 10/31/2020 0528   LYMPHSABS 0.8 10/31/2020 0528   MONOABS 0.7 10/31/2020 0528   EOSABS 0.2 10/31/2020 0528   BASOSABS 0.0 10/31/2020 0528    No results found for: "POCLITH", "LITHIUM"   No results found for: "PHENYTOIN", "PHENOBARB", "VALPROATE", "CBMZ"   .res Assessment: Plan:    Major depressive disorder, recurrent episode, moderate (HCC)  Generalized anxiety disorder  Insomnia due to other mental disorder  Neuropathy  Stress due to family tension   Disc sleep med options including no meds.  Wife complains about him taking sleep meds.  Chronic insomnia otherwise. Needs the meds. Takes Flexeril every other day and doesn't take hydroxyzine on that night alternates them. Tolerating meds.  Answered questions about diagnosis and prognosis.  Disc this at length.  He has well controled psych sx.   Answered questions about purpose of each med.  We discussed the short-term risks associated with benzodiazepines including sedation and increased fall risk among others.   Discussed long-term side effect risk including dependence, potential withdrawal symptoms, and the potential eventual dose-related risk of dementia.  Newer studies refute the risk Also discussed the consequences of poor sleep on brain and body health.  Also some risk from hydroxyzine also causing short-term problems.  He's doing well without SE so no changes.    Sleep hygiene. Disc timing of sleep meds to help him stay asleep.  Disc option increase hydroxyzine and hold flexeril or vice versa.  Flexeril helps back pain.  Lorazepam helps sleep as well.  Manages well with 6 hours of sleep.  Disc risk of multiple sleepers.  Overall he's doing well and sleep meds due not appear to be cause of sleepiness.  But discuss the reasons for it.  Extensive discussion of distinguishing dizziness related HS meds vs orthostatic.vs neuropathy.  Disc importance of managing glucose to reduce rate of progression of neuropathy.  The following offer intensive outpatient programs: [facility]  [facility] intensive outpatient program-available through the main [facility] phone number and ask for [place] [facility], he continues to go and it's better.  He is more depressed nor markedly anxious at this time.  He does need the medications for insomnia as noted.  Sleep better Lorazepam 1 mg and hydroxyzine 20 mg HS  For neuropathy and pain and depression:  increase duloxetine to 60 daily  FU 6-8 weeks.  Lynder Parents, MD, DFAPA     Please see After Visit Summary for patient specific instructions.  Future Appointments  Date Time Provider [place]  08/11/2022  7:45 AM Gardiner Barefoot, DPM [facility]  02/02/2023  9:00 AM Deneise Lever, MD [facility] None    No orders of the defined types were placed in this encounter.     -------------------------------

## 2022-06-07 NOTE — Patient Instructions (Addendum)
increase duloxetine to 60 mg daily and add B1 daily and add multivitamin

## 2022-06-12 DIAGNOSIS — L814 Other melanin hyperpigmentation: Secondary | ICD-10-CM | POA: Diagnosis not present

## 2022-06-12 DIAGNOSIS — L821 Other seborrheic keratosis: Secondary | ICD-10-CM | POA: Diagnosis not present

## 2022-06-12 DIAGNOSIS — D2272 Melanocytic nevi of left lower limb, including hip: Secondary | ICD-10-CM | POA: Diagnosis not present

## 2022-06-12 DIAGNOSIS — L820 Inflamed seborrheic keratosis: Secondary | ICD-10-CM | POA: Diagnosis not present

## 2022-06-12 DIAGNOSIS — D2271 Melanocytic nevi of right lower limb, including hip: Secondary | ICD-10-CM | POA: Diagnosis not present

## 2022-06-12 DIAGNOSIS — D225 Melanocytic nevi of trunk: Secondary | ICD-10-CM | POA: Diagnosis not present

## 2022-06-12 DIAGNOSIS — D2261 Melanocytic nevi of right upper limb, including shoulder: Secondary | ICD-10-CM | POA: Diagnosis not present

## 2022-06-12 DIAGNOSIS — D2239 Melanocytic nevi of other parts of face: Secondary | ICD-10-CM | POA: Diagnosis not present

## 2022-06-13 DIAGNOSIS — J42 Unspecified chronic bronchitis: Secondary | ICD-10-CM | POA: Diagnosis not present

## 2022-06-14 ENCOUNTER — Telehealth: Payer: Self-pay | Admitting: Psychiatry

## 2022-06-14 NOTE — Telephone Encounter (Signed)
Pt's wife, Gwyndolyn Saxon, LVM @ 2:30p.  Pt saw Dr [name] last week.  He started taking Duloxetine and is also drinking alcohol.  "It is very frightening".  Pt's wife states he is an alcoholic.  She says she has healthcare POA.  She would like a call back at [phone].  Next visit 1/24

## 2022-06-14 NOTE — Telephone Encounter (Signed)
Wife is concerned because patient is drinking wine and was started on duloxetine last week. She said all the warnings that came with the medication scare her with his drinking. She said patient is trying to cut back on his drinking and may drink 1/2 bottle of wine daily, but then other times may drink more. He denies drinking, but she said she finds empty bottles in the trunk of his car. She said he fell today, but fell into his chair and did not get hurt. She was gone for a meeting and she said he did eat lunch while she was gone. He is unable to discern scams from truth - thinking that he really did win the [employer]. Your note mentions him going to [place]. Wife said he never went to [place], that he refuses to go. He said there was a person at church who is a former alcoholic and he asked patient to go for coffee and patient originally agreed but later refused to go.   Wife was walking the dog when I called, said she is unable to talk at home. She has a Microbiologist and will be out of the house. I told her my lunch time and she said she would call around her meeting to see if you had any recommendations.

## 2022-06-22 DIAGNOSIS — E1140 Type 2 diabetes mellitus with diabetic neuropathy, unspecified: Secondary | ICD-10-CM | POA: Diagnosis not present

## 2022-06-22 DIAGNOSIS — R7989 Other specified abnormal findings of blood chemistry: Secondary | ICD-10-CM | POA: Diagnosis not present

## 2022-06-22 DIAGNOSIS — R945 Abnormal results of liver function studies: Secondary | ICD-10-CM | POA: Diagnosis not present

## 2022-06-28 ENCOUNTER — Other Ambulatory Visit: Payer: Self-pay | Admitting: Psychiatry

## 2022-06-28 DIAGNOSIS — F5105 Insomnia due to other mental disorder: Secondary | ICD-10-CM

## 2022-07-13 DIAGNOSIS — J42 Unspecified chronic bronchitis: Secondary | ICD-10-CM | POA: Diagnosis not present

## 2022-07-14 DIAGNOSIS — R3912 Poor urinary stream: Secondary | ICD-10-CM | POA: Diagnosis not present

## 2022-07-14 DIAGNOSIS — R3914 Feeling of incomplete bladder emptying: Secondary | ICD-10-CM | POA: Diagnosis not present

## 2022-07-24 DIAGNOSIS — R3914 Feeling of incomplete bladder emptying: Secondary | ICD-10-CM | POA: Diagnosis not present

## 2022-07-26 ENCOUNTER — Other Ambulatory Visit: Payer: Self-pay | Admitting: Urology

## 2022-08-08 NOTE — Progress Notes (Signed)
Anesthesia Review:  PCP: Cardiologist : Chest x-ray : EKG : 1-0/19/23  Echo : Stress test: Cardiac Cath :  Activity level:  Sleep Study/ CPAP : Fasting Blood Sugar :      / Checks Blood Sugar -- times a day:   Blood Thinner/ Instructions /Last Dose: ASA / Instructions/ Last Dose :

## 2022-08-08 NOTE — Patient Instructions (Signed)
SURGICAL WAITING ROOM VISITATION  Patients having surgery or a procedure may have no more than 2 support people in the waiting area - these visitors may rotate.    Children under the age of 8 must have an adult with them who is not the patient.  Due to an increase in RSV and influenza rates and associated hospitalizations, children ages 45 and under may not visit patients in [place].  If the patient needs to stay at the hospital during part of their recovery, the visitor guidelines for inpatient rooms apply. Pre-op nurse will coordinate an appropriate time for 1 support person to accompany patient in pre-op.  This support person may not rotate.    Please refer to the [facility] website for the visitor guidelines for Inpatients (after your surgery is over and you are in a regular room).       Your procedure is scheduled on:  08/17/2022    Report to [facility] Main Entrance    Report to admitting at  [place] AM   Call this number if you have problems the morning of surgery [phone]   Do not eat food  or drink liquids After Midnight.                               If you have questions, please contact your surgeon's office.       Oral Hygiene is also important to reduce your risk of infection.                                    Remember - BRUSH YOUR TEETH THE MORNING OF SURGERY WITH YOUR REGULAR TOOTHPASTE  DENTURES WILL BE REMOVED PRIOR TO SURGERY PLEASE DO NOT APPLY "Poly grip" OR ADHESIVES!!!   Do NOT smoke after Midnight   Take these medicines the morning of surgery with A SIP OF WATER:  flonase   DO NOT TAKE ANY ORAL DIABETIC MEDICATIONS DAY OF YOUR SURGERY  Bring CPAP mask and tubing day of surgery.                              You may not have any metal on your body including hair pins, jewelry, and body piercing             Do not wear make-up, lotions, powders, perfumes/cologne, or deodorant  Do not wear nail polish including gel and  S&S, artificial/acrylic nails, or any other type of covering on natural nails including finger and toenails. If you have artificial nails, gel coating, etc. that needs to be removed by a nail salon please have this removed prior to surgery or surgery may need to be canceled/ delayed if the surgeon/ anesthesia feels like they are unable to be safely monitored.   Do not shave  48 hours prior to surgery.               Men may shave face and neck.   Do not bring valuables to the hospital. [place].   Contacts, glasses, dentures or bridgework may not be worn into surgery.   Bring small overnight bag day of surgery.   DO NOT BRING YOUR HOME MEDICATIONS TO THE  HOSPITAL. PHARMACY WILL DISPENSE MEDICATIONS LISTED ON YOUR MEDICATION LIST TO YOU DURING YOUR ADMISSION [place]!    Patients discharged on the day of surgery will not be allowed to drive home.  Someone NEEDS to stay with you for the first 24 hours after anesthesia.   Special Instructions: Bring a copy of your healthcare power of attorney and living will documents the day of surgery if you haven't scanned them before.              Please read over the following fact sheets you were given: IF [place] [phone]   If you received a COVID test during your pre-op visit  it is requested that you wear a mask when out in public, stay away from anyone that may not be feeling well and notify your surgeon if you develop symptoms. If you test positive for Covid or have been in contact with anyone that has tested positive in the last 10 days please notify you surgeon.    [place] - Preparing for Surgery Before surgery, you can play an important role.  Because skin is not sterile, your skin needs to be as free of germs as possible.  You can reduce the number of germs on your skin by washing with CHG (chlorahexidine gluconate) soap before  surgery.  CHG is an antiseptic cleaner which kills germs and bonds with the skin to continue killing germs even after washing. Please DO NOT use if you have an allergy to CHG or antibacterial soaps.  If your skin becomes reddened/irritated stop using the CHG and inform your nurse when you arrive at Short Stay. Do not shave (including legs and underarms) for at least 48 hours prior to the first CHG shower.  You may shave your face/neck. Please follow these instructions carefully:  1.  Shower with CHG Soap the night before surgery and the  morning of Surgery.  2.  If you choose to wash your hair, wash your hair first as usual with your  normal  shampoo.  3.  After you shampoo, rinse your hair and body thoroughly to remove the  shampoo.                           4.  Use CHG as you would any other liquid soap.  You can apply chg directly  to the skin and wash                       Gently with a scrungie or clean washcloth.  5.  Apply the CHG Soap to your body ONLY FROM THE NECK DOWN.   Do not use on face/ open                           Wound or open sores. Avoid contact with eyes, ears mouth and genitals (private parts).                       Wash face,  Genitals (private parts) with your normal soap.             6.  Wash thoroughly, paying special attention to the area where your surgery  will be performed.  7.  Thoroughly rinse your body with warm water from the neck down.  8.  DO NOT shower/wash with your normal soap  after using and rinsing off  the CHG Soap.                9.  Pat yourself dry with a clean towel.            10.  Wear clean pajamas.            11.  Place clean sheets on your bed the night of your first shower and do not  sleep with pets. Day of Surgery : Do not apply any lotions/deodorants the morning of surgery.  Please wear clean clothes to the hospital/surgery center.  FAILURE TO FOLLOW THESE INSTRUCTIONS MAY RESULT IN THE CANCELLATION OF YOUR SURGERY PATIENT  SIGNATURE_________________________________  NURSE SIGNATURE__________________________________  ________________________________________________________________________

## 2022-08-09 ENCOUNTER — Other Ambulatory Visit: Payer: Self-pay

## 2022-08-09 ENCOUNTER — Encounter (HOSPITAL_COMMUNITY)
Admission: RE | Admit: 2022-08-09 | Discharge: 2022-08-09 | Disposition: A | Discharge status: Home / Self Care | Payer: Medicare Other | Source: Ambulatory Visit | Attending: Urology | Admitting: Urology

## 2022-08-09 ENCOUNTER — Encounter (HOSPITAL_COMMUNITY): Payer: Self-pay

## 2022-08-09 VITALS — BP 139/85 | HR 91 | Temp 98.6°F | Resp 16 | Ht 74.0 in | Wt 185.0 lb

## 2022-08-09 DIAGNOSIS — R7303 Prediabetes: Secondary | ICD-10-CM | POA: Diagnosis not present

## 2022-08-09 DIAGNOSIS — Z01818 Encounter for other preprocedural examination: Secondary | ICD-10-CM

## 2022-08-09 DIAGNOSIS — Z01812 Encounter for preprocedural laboratory examination: Secondary | ICD-10-CM | POA: Diagnosis not present

## 2022-08-09 HISTORY — DX: Prediabetes: R73.03

## 2022-08-09 LAB — CBC
HCT: 48.1 % (ref 39.0–52.0)
Hemoglobin: 15.8 g/dL (ref 13.0–17.0)
MCH: 32 pg (ref 26.0–34.0)
MCHC: 32.8 g/dL (ref 30.0–36.0)
MCV: 97.6 fL (ref 80.0–100.0)
Platelets: 262 10*3/uL (ref 150–400)
RBC: 4.93 MIL/uL (ref 4.22–5.81)
RDW: 14 % (ref 11.5–15.5)
WBC: 7.5 10*3/uL (ref 4.0–10.5)
nRBC: 0 % (ref 0.0–0.2)

## 2022-08-09 LAB — HEMOGLOBIN A1C
Hgb A1c MFr Bld: 6.1 % — ABNORMAL HIGH (ref 4.8–5.6)
Mean Plasma Glucose: 128 mg/dL

## 2022-08-09 LAB — BASIC METABOLIC PANEL
Anion gap: 10 (ref 5–15)
BUN: 15 mg/dL (ref 8–23)
CO2: 25 mmol/L (ref 22–32)
Calcium: 9.5 mg/dL (ref 8.9–10.3)
Chloride: 103 mmol/L (ref 98–111)
Creatinine, Ser: 1.02 mg/dL (ref 0.61–1.24)
GFR, Estimated: >60 mL/min (ref >60)
Glucose, Bld: 187 mg/dL — ABNORMAL HIGH (ref 70–99)
Potassium: 4.7 mmol/L (ref 3.5–5.1)
Sodium: 138 mmol/L (ref 135–145)

## 2022-08-09 LAB — GLUCOSE, CAPILLARY: Glucose-Capillary: 183 mg/dL — ABNORMAL HIGH (ref 70–99)

## 2022-08-11 ENCOUNTER — Ambulatory Visit: Payer: Medicare Other | Admitting: Podiatry

## 2022-08-11 ENCOUNTER — Encounter: Payer: Self-pay | Admitting: Podiatry

## 2022-08-11 DIAGNOSIS — M79675 Pain in left toe(s): Secondary | ICD-10-CM | POA: Diagnosis not present

## 2022-08-11 DIAGNOSIS — B351 Tinea unguium: Secondary | ICD-10-CM

## 2022-08-11 DIAGNOSIS — G629 Polyneuropathy, unspecified: Secondary | ICD-10-CM

## 2022-08-11 DIAGNOSIS — M79674 Pain in right toe(s): Secondary | ICD-10-CM

## 2022-08-11 NOTE — Progress Notes (Addendum)
This patient returns to my office for at risk foot care.  This patient requires this care by a professional since this patient will be at risk due to having neuropathy.  Patient is taking neurontin. Patient was referred to this office by Dr.  Harrington Challenger.  Patient says he experiences weakness when walking which he says is due to neuropathy. This patient is unable to cut nails himself since the patient cannot reach his nails.These nails are painful walking and wearing shoes.  This patient presents for at risk foot care today.  General Appearance  Alert, conversant and in no acute stress.  Vascular  Dorsalis pedis and posterior tibial  pulses are palpable  bilaterally.  Capillary return is within normal limits  bilaterally. Temperature is within normal limits  bilaterally.  Neurologic  Senn-Weinstein monofilament wire test diminished  bilaterally. Muscle power within normal limits bilaterally.  Nails Thick disfigured discolored nails with subungual debris  hallux nails  bilaterally. No evidence of bacterial infection or drainage bilaterally.  Orthopedic  No limitations of motion  feet .  No crepitus or effusions noted.  No bony pathology or digital deformities noted.  HAV  right foot.  Skin  normotropic skin with no porokeratosis noted bilaterally.  No signs of infections or ulcers noted.     Onychomycosis  Pain in right toes  Pain in left toes  Consent was obtained for treatment procedures.   Mechanical debridement of nails 1-5  bilaterally performed with a nail nipper.  Filed with dremel without incident.  .  Patient is concerned about his falling due to neuropathy.     Return office visit  3   months                    Told patient to return for periodic foot care and evaluation due to potential at risk complications.   Gardiner Barefoot DPM

## 2022-08-13 DIAGNOSIS — J42 Unspecified chronic bronchitis: Secondary | ICD-10-CM | POA: Diagnosis not present

## 2022-08-16 NOTE — H&P (Signed)
H&P  Chief Complaint: BPH with obstruction  History of Present Illness: 77 year old male presents today for TURP.  He has had BPH for some time.  Symptoms are worsening and he was recently found to have an increasing residual urine volume.  Due to this, and the fact that this is not managed adequately with medical treatment, he is undergoing TURP.  Past Medical History:  Diagnosis Date   Anxiety    Asthma    as a child   Depression    sees Dr [name]   History of BPH    Dr.Asli Tokarski   Hyperlipidemia    Pre-diabetes    Prediabetes    Sleep disturbances     Past Surgical History:  Procedure Laterality Date   CIRCUMCISION     LEFT HEART CATHETERIZATION WITH CORONARY ANGIOGRAM N/A 04/30/2014   Procedure: LEFT HEART CATHETERIZATION WITH CORONARY ANGIOGRAM;  Surgeon: [name], MD;  Location: [facility] CATH LAB;  Service: Cardiovascular;  Laterality: N/A;   NASAL SEPTUM SURGERY  1975   R cheek benign tumor      1992, again 1998.   TONSILLECTOMY      Home Medications:  Allergies as of 08/16/2022       Reactions   Zocor [simvastatin] Other (See Comments)   Muscle aches   Trazodone And Nefazodone Other (See Comments)   "crazy"        Medication List      Notice   Cannot display discharge medications because the patient has not yet been admitted.     Allergies:  Allergies  Allergen Reactions   Zocor [Simvastatin] Other (See Comments)    Muscle aches   Trazodone And Nefazodone Other (See Comments)    "crazy"    Family History  Problem Relation Age of Onset   Dementia Mother    Stroke Mother    Cancer - Prostate Father    Melanoma Father    Hyperlipidemia Sister        high cholestrol   Depression Brother    Suicidality Brother    Lung disease Maternal Grandfather    Heart attack Neg Hx    Hypertension Neg Hx     Social History:  reports that he has never smoked. He has never used smokeless tobacco. He reports current alcohol use of about 14.0 - 21.0  standard drinks of alcohol per week. He reports that he does not use drugs.  ROS: A complete review of systems was performed.  All systems are negative except for pertinent findings as noted.  Physical Exam:  Vital signs in last 24 hours: There were no vitals taken for this visit. Constitutional:  Alert and oriented, No acute distress Cardiovascular: Regular rate  Respiratory: Normal respiratory effort GI: Abdomen is soft, nontender, nondistended, no abdominal masses. No CVAT.  Genitourinary: Normal male phallus, testes are descended bilaterally and non-tender and without masses, scrotum is normal in appearance without lesions or masses, perineum is normal on inspection. Lymphatic: No lymphadenopathy Neurologic: Grossly intact, no focal deficits Psychiatric: Normal mood and affect  I have reviewed prior pt notes  I have reviewed urinalysis results  I have independently reviewed prior imaging  I have reviewed prior PSA results   Impression/Assessment:  BPH with obstruction  Plan:  TURP

## 2022-08-17 ENCOUNTER — Other Ambulatory Visit: Payer: Self-pay

## 2022-08-17 ENCOUNTER — Ambulatory Visit (HOSPITAL_COMMUNITY): Payer: Medicare Other | Admitting: Physician Assistant

## 2022-08-17 ENCOUNTER — Ambulatory Visit (HOSPITAL_BASED_OUTPATIENT_CLINIC_OR_DEPARTMENT_OTHER): Payer: Medicare Other | Admitting: Registered Nurse

## 2022-08-17 ENCOUNTER — Encounter (HOSPITAL_COMMUNITY): Payer: Self-pay | Admitting: Urology

## 2022-08-17 ENCOUNTER — Observation Stay (HOSPITAL_COMMUNITY)
Admission: RE | Admit: 2022-08-17 | Discharge: 2022-08-18 | Disposition: A | Discharge status: Home / Self Care | Payer: Medicare Other | Attending: Urology | Admitting: Urology

## 2022-08-17 ENCOUNTER — Encounter (HOSPITAL_COMMUNITY)
Admission: RE | Disposition: A | Discharge status: Home / Self Care | Payer: Self-pay | Source: Home / Self Care | Attending: Urology

## 2022-08-17 DIAGNOSIS — N400 Enlarged prostate without lower urinary tract symptoms: Secondary | ICD-10-CM | POA: Diagnosis present

## 2022-08-17 DIAGNOSIS — N138 Other obstructive and reflux uropathy: Secondary | ICD-10-CM | POA: Diagnosis not present

## 2022-08-17 DIAGNOSIS — N401 Enlarged prostate with lower urinary tract symptoms: Secondary | ICD-10-CM | POA: Diagnosis not present

## 2022-08-17 DIAGNOSIS — N320 Bladder-neck obstruction: Secondary | ICD-10-CM | POA: Insufficient documentation

## 2022-08-17 DIAGNOSIS — J45909 Unspecified asthma, uncomplicated: Secondary | ICD-10-CM | POA: Diagnosis not present

## 2022-08-17 DIAGNOSIS — Z01818 Encounter for other preprocedural examination: Secondary | ICD-10-CM

## 2022-08-17 DIAGNOSIS — C61 Malignant neoplasm of prostate: Secondary | ICD-10-CM | POA: Diagnosis not present

## 2022-08-17 HISTORY — PX: TRANSURETHRAL RESECTION OF PROSTATE: SHX73

## 2022-08-17 SURGERY — TURP (TRANSURETHRAL RESECTION OF PROSTATE)
Anesthesia: General

## 2022-08-17 MED ORDER — ORAL CARE MOUTH RINSE: 15.0000 mL | Freq: Once | OROMUCOSAL | Status: AC

## 2022-08-17 MED ORDER — ONDANSETRON HCL 4 MG/2ML IJ SOLN: 4.0000 mg | Freq: Four times a day (QID) | INTRAMUSCULAR | Status: DC | PRN

## 2022-08-17 MED ORDER — LIDOCAINE HCL (PF) 2 % IJ SOLN
INTRAMUSCULAR | Status: AC
  Filled 2022-08-17: qty 5

## 2022-08-17 MED ORDER — ONDANSETRON HCL 4 MG/2ML IJ SOLN
INTRAMUSCULAR | Status: AC
  Filled 2022-08-17: qty 2

## 2022-08-17 MED ORDER — HYDROCODONE-ACETAMINOPHEN 5-325 MG PO TABS
ORAL_TABLET | ORAL | Status: AC
  Filled 2022-08-17: qty 1

## 2022-08-17 MED ORDER — OXYCODONE HCL 5 MG PO TABS
ORAL_TABLET | ORAL | Status: AC
  Filled 2022-08-17: qty 1

## 2022-08-17 MED ORDER — DEXAMETHASONE SODIUM PHOSPHATE 10 MG/ML IJ SOLN
INTRAMUSCULAR | Status: AC
  Filled 2022-08-17: qty 1

## 2022-08-17 MED ORDER — FENTANYL CITRATE (PF) 100 MCG/2ML IJ SOLN
INTRAMUSCULAR | Status: DC | PRN
  Administered 2022-08-17 (×2): 25 ug via INTRAVENOUS
  Administered 2022-08-17: 100 ug via INTRAVENOUS

## 2022-08-17 MED ORDER — DULOXETINE HCL 60 MG PO CPEP
60.0000 mg | ORAL_CAPSULE | Freq: Every day | ORAL | Status: DC
  Administered 2022-08-17 – 2022-08-18 (×2): 60 mg via ORAL
  Filled 2022-08-17 (×2): qty 1

## 2022-08-17 MED ORDER — PROPOFOL 10 MG/ML IV BOLUS
INTRAVENOUS | Status: DC | PRN
  Administered 2022-08-17: 200 mg via INTRAVENOUS

## 2022-08-17 MED ORDER — MIDAZOLAM HCL 5 MG/5ML IJ SOLN
INTRAMUSCULAR | Status: DC | PRN
  Administered 2022-08-17: 2 mg via INTRAVENOUS

## 2022-08-17 MED ORDER — CEFAZOLIN SODIUM-DEXTROSE 2-4 GM/100ML-% IV SOLN
2.0000 g | INTRAVENOUS | Status: AC
  Administered 2022-08-17: 2 g via INTRAVENOUS
  Filled 2022-08-17: qty 100

## 2022-08-17 MED ORDER — SODIUM CHLORIDE 0.9 % IR SOLN
Status: DC | PRN
  Administered 2022-08-17: 24000 mL via INTRAVESICAL

## 2022-08-17 MED ORDER — OXYCODONE HCL 5 MG PO TABS
5.0000 mg | ORAL_TABLET | Freq: Once | ORAL | Status: AC | PRN
  Administered 2022-08-17: 5 mg via ORAL

## 2022-08-17 MED ORDER — PHENYLEPHRINE HCL (PRESSORS) 10 MG/ML IV SOLN
INTRAVENOUS | Status: AC
  Filled 2022-08-17: qty 1

## 2022-08-17 MED ORDER — PHENYLEPHRINE 80 MCG/ML (10ML) SYRINGE FOR IV PUSH (FOR BLOOD PRESSURE SUPPORT)
PREFILLED_SYRINGE | INTRAVENOUS | Status: DC | PRN
  Administered 2022-08-17 (×2): 80 ug via INTRAVENOUS
  Administered 2022-08-17 (×3): 160 ug via INTRAVENOUS

## 2022-08-17 MED ORDER — OXYBUTYNIN CHLORIDE 5 MG PO TABS
5.0000 mg | ORAL_TABLET | Freq: Three times a day (TID) | ORAL | Status: DC | PRN
  Administered 2022-08-17: 5 mg via ORAL

## 2022-08-17 MED ORDER — 0.9 % SODIUM CHLORIDE (POUR BTL) OPTIME
TOPICAL | Status: DC | PRN
  Administered 2022-08-17: 1000 mL

## 2022-08-17 MED ORDER — SENNA 8.6 MG PO TABS
1.0000 | ORAL_TABLET | Freq: Two times a day (BID) | ORAL | Status: DC
  Administered 2022-08-17: 8.6 mg via ORAL
  Filled 2022-08-17 (×2): qty 1

## 2022-08-17 MED ORDER — MIDAZOLAM HCL 2 MG/2ML IJ SOLN
INTRAMUSCULAR | Status: AC
  Filled 2022-08-17: qty 2

## 2022-08-17 MED ORDER — FENTANYL CITRATE (PF) 100 MCG/2ML IJ SOLN
INTRAMUSCULAR | Status: AC
  Filled 2022-08-17: qty 2

## 2022-08-17 MED ORDER — HYDROCODONE-ACETAMINOPHEN 5-325 MG PO TABS
1.0000 | ORAL_TABLET | ORAL | Status: DC | PRN
  Administered 2022-08-17 (×2): 1 via ORAL
  Administered 2022-08-17 – 2022-08-18 (×2): 2 via ORAL
  Filled 2022-08-17 (×2): qty 2

## 2022-08-17 MED ORDER — LACTATED RINGERS IV SOLN: INTRAVENOUS | Status: DC

## 2022-08-17 MED ORDER — EPHEDRINE 5 MG/ML INJ
INTRAVENOUS | Status: AC
  Filled 2022-08-17: qty 5

## 2022-08-17 MED ORDER — DEXAMETHASONE SODIUM PHOSPHATE 10 MG/ML IJ SOLN
INTRAMUSCULAR | Status: DC | PRN
  Administered 2022-08-17: 8 mg via INTRAVENOUS

## 2022-08-17 MED ORDER — CARVEDILOL 3.125 MG PO TABS
3.1250 mg | ORAL_TABLET | Freq: Every day | ORAL | Status: DC
  Administered 2022-08-17: 3.125 mg via ORAL
  Filled 2022-08-17: qty 1

## 2022-08-17 MED ORDER — SODIUM CHLORIDE 0.9 % IR SOLN
3000.0000 mL | Status: DC
  Administered 2022-08-17: 3000 mL

## 2022-08-17 MED ORDER — FENTANYL CITRATE PF 50 MCG/ML IJ SOSY
PREFILLED_SYRINGE | INTRAMUSCULAR | Status: AC
  Filled 2022-08-17: qty 1

## 2022-08-17 MED ORDER — SULFAMETHOXAZOLE-TRIMETHOPRIM 800-160 MG PO TABS
1.0000 | ORAL_TABLET | Freq: Two times a day (BID) | ORAL | Status: DC
  Administered 2022-08-17 – 2022-08-18 (×3): 1 via ORAL
  Filled 2022-08-17 (×3): qty 1

## 2022-08-17 MED ORDER — FLUTICASONE PROPIONATE 50 MCG/ACT NA SUSP
1.0000 | Freq: Every day | NASAL | Status: DC
  Administered 2022-08-18: 1 via NASAL
  Filled 2022-08-17: qty 16

## 2022-08-17 MED ORDER — PROPOFOL 10 MG/ML IV BOLUS
INTRAVENOUS | Status: AC
  Filled 2022-08-17: qty 20

## 2022-08-17 MED ORDER — FENTANYL CITRATE PF 50 MCG/ML IJ SOSY
PREFILLED_SYRINGE | INTRAMUSCULAR | Status: AC
  Filled 2022-08-17: qty 2

## 2022-08-17 MED ORDER — LORAZEPAM 1 MG PO TABS
1.0000 mg | ORAL_TABLET | Freq: Every day | ORAL | Status: DC
  Administered 2022-08-17: 1 mg via ORAL
  Filled 2022-08-17: qty 1

## 2022-08-17 MED ORDER — EPHEDRINE SULFATE-NACL 50-0.9 MG/10ML-% IV SOSY
PREFILLED_SYRINGE | INTRAVENOUS | Status: DC | PRN
  Administered 2022-08-17 (×3): 5 mg via INTRAVENOUS

## 2022-08-17 MED ORDER — CHLORHEXIDINE GLUCONATE 0.12 % MT SOLN
15.0000 mL | Freq: Once | OROMUCOSAL | Status: AC
  Administered 2022-08-17: 15 mL via OROMUCOSAL

## 2022-08-17 MED ORDER — ONDANSETRON HCL 4 MG/2ML IJ SOLN
INTRAMUSCULAR | Status: DC | PRN
  Administered 2022-08-17: 4 mg via INTRAVENOUS

## 2022-08-17 MED ORDER — LIDOCAINE 2% (20 MG/ML) 5 ML SYRINGE
INTRAMUSCULAR | Status: DC | PRN
  Administered 2022-08-17: 100 mg via INTRAVENOUS

## 2022-08-17 MED ORDER — HYDROXYZINE HCL 10 MG PO TABS
20.0000 mg | ORAL_TABLET | Freq: Three times a day (TID) | ORAL | Status: DC | PRN
  Administered 2022-08-17 – 2022-08-18 (×2): 20 mg via ORAL
  Filled 2022-08-17 (×2): qty 2

## 2022-08-17 MED ORDER — FENTANYL CITRATE PF 50 MCG/ML IJ SOSY
25.0000 ug | PREFILLED_SYRINGE | INTRAMUSCULAR | Status: DC | PRN
  Administered 2022-08-17: 50 ug via INTRAVENOUS
  Administered 2022-08-17: 25 ug via INTRAVENOUS
  Administered 2022-08-17: 50 ug via INTRAVENOUS
  Administered 2022-08-17: 25 ug via INTRAVENOUS

## 2022-08-17 MED ORDER — OXYCODONE HCL 5 MG/5ML PO SOLN: 5.0000 mg | Freq: Once | ORAL | Status: AC | PRN

## 2022-08-17 MED ORDER — STERILE WATER FOR IRRIGATION IR SOLN
Status: DC | PRN
  Administered 2022-08-17: 100 mL

## 2022-08-17 MED ORDER — SODIUM CHLORIDE 0.45 % IV SOLN: INTRAVENOUS | Status: DC

## 2022-08-17 MED ORDER — OXYBUTYNIN CHLORIDE 5 MG PO TABS
ORAL_TABLET | ORAL | Status: AC
  Filled 2022-08-17: qty 1

## 2022-08-17 MED ORDER — EZETIMIBE 10 MG PO TABS
10.0000 mg | ORAL_TABLET | Freq: Every day | ORAL | Status: DC
  Administered 2022-08-17: 10 mg via ORAL
  Filled 2022-08-17: qty 1

## 2022-08-17 SURGICAL SUPPLY — 20 items
BAG DRN RND TRDRP ANRFLXCHMBR (UROLOGICAL SUPPLIES) ×1
BAG URINE DRAIN 2000ML AR STRL (UROLOGICAL SUPPLIES) ×1 IMPLANT
BAG URO CATCHER STRL LF (MISCELLANEOUS) ×1 IMPLANT
BLADE SURG 15 STRL LF DISP TIS (BLADE) IMPLANT
BLADE SURG 15 STRL SS (BLADE)
CATH HEMA 3WAY 30CC 22FR COUDE (CATHETERS) ×1 IMPLANT
DRAPE FOOT SWITCH (DRAPES) ×1 IMPLANT
EVACUATOR MICROVAS BLADDER (UROLOGICAL SUPPLIES) ×1 IMPLANT
GLOVE SURG LX STRL 8.0 MICRO (GLOVE) ×1 IMPLANT
GOWN STRL REUS W/ TWL XL LVL3 (GOWN DISPOSABLE) ×1 IMPLANT
GOWN STRL REUS W/TWL XL LVL3 (GOWN DISPOSABLE) ×1
HOLDER FOLEY CATH W/STRAP (MISCELLANEOUS) IMPLANT
LOOP CUT BIPOLAR 24F LRG (ELECTROSURGICAL) ×1 IMPLANT
MANIFOLD NEPTUNE II (INSTRUMENTS) ×1 IMPLANT
PACK CYSTO (CUSTOM PROCEDURE TRAY) ×1 IMPLANT
PENCIL SMOKE EVACUATOR (MISCELLANEOUS) IMPLANT
SUT ETHILON 3 0 PS 1 (SUTURE) IMPLANT
SYR 30ML LL (SYRINGE) IMPLANT
TUBING CONNECTING 10 (TUBING) ×1 IMPLANT
TUBING UROLOGY SET (TUBING) ×1 IMPLANT

## 2022-08-17 NOTE — Anesthesia Preprocedure Evaluation (Signed)
Anesthesia Evaluation  Patient identified by MRN, date of birth, ID band Patient awake    Reviewed: Allergy & Precautions, H&P , NPO status , Patient's Chart, lab work & pertinent test results  Airway Mallampati: II   Neck ROM: full    Dental   Pulmonary asthma    breath sounds clear to auscultation       Cardiovascular  Rhythm:regular Rate:Normal  hyperlipidemia   Neuro/Psych  PSYCHIATRIC DISORDERS Anxiety Depression       GI/Hepatic ,GERD  ,,  Endo/Other    Renal/GU    BPH    Musculoskeletal   Abdominal   Peds  Hematology   Anesthesia Other Findings   Reproductive/Obstetrics                             Anesthesia Physical Anesthesia Plan  ASA: 2  Anesthesia Plan: General   Post-op Pain Management:    Induction: Intravenous  PONV Risk Score and Plan: 2 and Ondansetron, Dexamethasone, Midazolam and Treatment may vary due to age or medical condition  Airway Management Planned: LMA  Additional Equipment:   Intra-op Plan:   Post-operative Plan: Extubation in OR  Informed Consent: I have reviewed the patients History and Physical, chart, labs and discussed the procedure including the risks, benefits and alternatives for the proposed anesthesia with the patient or authorized representative who has indicated his/her understanding and acceptance.     Dental advisory given  Plan Discussed with: CRNA, Anesthesiologist and Surgeon  Anesthesia Plan Comments:        Anesthesia Quick Evaluation

## 2022-08-17 NOTE — Discharge Instructions (Signed)
Transurethral Resection of the Prostate ° °Care After ° °Refer to this sheet in the next few weeks. These discharge instructions provide you with general information on caring for yourself after you leave the hospital. Your caregiver may also give you specific instructions. Your treatment has been planned according to the most current medical practices available, but unavoidable complications sometimes occur. If you have any problems or questions after discharge, please call your caregiver. ° °HOME CARE INSTRUCTIONS  ° °Medications °· You may receive medicine for pain management. As your level of discomfort decreases, adjustments in your pain medicines may be made.  °· Take all medicines as directed.  °· You may be given a medicine (antibiotic) to kill germs following surgery. Finish all medicines. Let your caregiver know if you have any side effects or problems from the medicine.  °· If you are on aspirin, it would be best not to restart the aspirin until the blood in the urine clears °Hygiene °· You can take a shower after surgery.  °· You should not take a bath while you still have the urethral catheter. °Activity °· You will be encouraged to get out of bed as much as possible and increase your activity level as tolerated.  °· Spend the first week in and around your home. For 3 weeks, avoid the following:  °· Straining.  °· Running.  °· Strenuous work.  °· Walks longer than a few blocks.  °· Riding for extended periods.  °· Sexual relations.  °· Do not lift heavy objects (more than 20 pounds) for at least 1 month. When lifting, use your arms instead of your abdominal muscles.  °· You will be encouraged to walk as tolerated. Do not exert yourself. Increase your activity level slowly. Remember that it is important to keep moving after an operation of any type. This cuts down on the possibility of developing blood clots.  °· Your caregiver will tell you when you can resume driving and light housework. Discuss this  at your first office visit after discharge. °Diet °· No special diet is ordered after a TURP. However, if you are on a special diet for another medical problem, it should be continued.  °· Normal fluid intake is usually recommended.  °· Avoid alcohol and caffeinated drinks for 2 weeks. They irritate the bladder. Decaffeinated drinks are okay.  °· Avoid spicy foods.  °Bladder Function °· For the first 10 days, empty the bladder whenever you feel a definite desire. Do not try to hold the urine for long periods of time.  °· Urinating once or twice a night even after you are healed is not uncommon.  °· You may see some recurrence of blood in the urine after discharge from the hospital. This usually happens within 2 weeks after the procedure.If this occurs, force fluids again as you did in the hospital and reduce your activity.  °Bowel Function °· You may experience some constipation after surgery. This can be minimized by increasing fluids and fiber in your diet. Drink enough water and fluids to keep your urine clear or pale yellow.  °· A stool softener may be prescribed for use at home. Do not strain to move your bowels.  °· If you are requiring increased pain medicine, it is important that you take stool softeners to prevent constipation. This will help to promote proper healing by reducing the need to strain to move your bowels.  °Sexual Activity °· Semen movement in the opposite direction and into the bladder (  retrograde ejaculation) may occur. Since the semen passes into the bladder, cloudy urine can occur the first time you urinate after intercourse. Or, you may not have an ejaculation during erection. Ask your caregiver when you can resume sexual activity. Retrograde ejaculation and reduced semen discharge should not reduce one's pleasure of intercourse.  °Postoperative Visit °· Arrange the date and time of your after surgery visit with your caregiver.  °Return to Work °· After your recovery is complete, you will  be able to return to work and resume all activities. Your caregiver will inform you when you can return to work.  °Foley Catheter Care °A soft, flexible tube (Foley catheter) may have been placed in your bladder to drain urine and fluid. Follow these instructions: °Taking Care of the Catheter °· Keep the area where the catheter leaves your body clean.  °· Attach the catheter to the leg so there is no tension on the catheter.  °· Keep the drainage bag below the level of the bladder, but keep it OFF the floor.  °· Do not take long soaking baths. Your caregiver will give instructions about showering.  °· Wash your hands before touching ANYTHING related to the catheter or bag.  °· Using mild soap and warm water on a washcloth:  °· Clean the area closest to the catheter insertion site using a circular motion around the catheter.  °· Clean the catheter itself by wiping AWAY from the insertion site for several inches down the tube.  °· NEVER wipe upward as this could sweep bacteria up into the urethra (tube in your body that normally drains the bladder) and cause infection.  °· Place a small amount of sterile lubricant at the tip of the penis where the catheter is entering.  °Taking Care of the Drainage Bags °· Two drainage bags may be taken home: a large overnight drainage bag, and a smaller leg bag which fits underneath clothing.  °· It is okay to wear the overnight bag at any time, but NEVER wear the smaller leg bag at night.  °· Keep the drainage bag well below the level of your bladder. This prevents backflow of urine into the bladder and allows the urine to drain freely.  °· Anchor the tubing to your leg to prevent pulling or tension on the catheter. Use tape or a leg strap provided by the hospital.  °· Empty the drainage bag when it is 1/2 to 3/4 full. Wash your hands before and after touching the bag.  °· Periodically check the tubing for kinks to make sure there is no pressure on the tubing which could restrict  the flow of urine.  °Changing the Drainage Bags °· Cleanse both ends of the clean bag with alcohol before changing.  °· Pinch off the rubber catheter to avoid urine spillage during the disconnection.  °· Disconnect the dirty bag and connect the clean one.  °· Empty the dirty bag carefully to avoid a urine spill.  °· Attach the new bag to the leg with tape or a leg strap.  °Cleaning the Drainage Bags °· Whenever a drainage bag is disconnected, it must be cleaned quickly so it is ready for the next use.  °· Wash the bag in warm, soapy water.  °· Rinse the bag thoroughly with warm water.  °· Soak the bag for 30 minutes in a solution of white vinegar and water (1 cup vinegar to 1 quart warm water).  °· Rinse with warm water.  °SEEK MEDICAL   CARE IF:  °· You have chills or night sweats.  °· You are leaking around your catheter or have problems with your catheter. It is not uncommon to have sporadic leakage around your catheter as a result of bladder spasms. If the leakage stops, there is not much need for concern. If you are uncertain, call your caregiver.  °· You develop side effects that you think are coming from your medicines.  °SEEK IMMEDIATE MEDICAL CARE IF:  °· You are suddenly unable to urinate. Check to see if there are any kinks in the drainage tubing that may cause this. If you cannot find any kinks, call your caregiver immediately. This is an emergency.  °· You develop shortness of breath or chest pains.  °· Bleeding persists or clots develop in your urine.  °· You have a fever.  °· You develop pain in your back or over your lower belly (abdomen).  °· You develop pain or swelling in your legs.  °· Any problems you are having get worse rather than better.  °MAKE SURE YOU:  °· Understand these instructions.  °· Will watch your condition.  °· Will get help right away if you are not doing well or get worse.  °Document Released: 07/24/2005 Document Revised: 04/05/2011 Document Reviewed: 03/17/2009 °[employer]®  Patient Information ©2012 [employer].Transurethral Resection of the Prostate °Care After °Refer to this sheet in the next few weeks. These discharge instructions provide you with general information on caring for yourself after you leave the hospital. Your caregiver may also give you specific instructions. Your treatment has been planned according to the most current medical practices available, but unavoidable complications sometimes occur. If you have any problems or questions after discharge, please call your caregiver. °

## 2022-08-17 NOTE — Transfer of Care (Signed)
Immediate Anesthesia Transfer of Care Note  Patient: Eduardo Watts.  Procedure(s) Performed: TRANSURETHRAL RESECTION OF THE PROSTATE (TURP)  Patient Location: PACU  Anesthesia Type:General  Level of Consciousness: awake, alert , oriented, and patient cooperative  Airway & Oxygen Therapy: Patient Spontanous Breathing and Patient connected to face mask oxygen  Post-op Assessment: Report given to RN, Post -op Vital signs reviewed and stable, and Patient moving all extremities  Post vital signs: Reviewed and stable  Last Vitals:  Vitals Value Taken Time  BP 126/79 08/17/22 0904  Temp    Pulse 90 08/17/22 0906  Resp 19 08/17/22 0906  SpO2 100 % 08/17/22 0906  Vitals shown include unvalidated device data.  Last Pain:  Vitals:   08/17/22 0613  TempSrc:   PainSc: 0-No pain      Patients Stated Pain Goal: 3 (23/30/07 6226)  Complications: No notable events documented.

## 2022-08-17 NOTE — Anesthesia Postprocedure Evaluation (Signed)
Anesthesia Post Note  Patient: Eduardo Watts.  Procedure(s) Performed: TRANSURETHRAL RESECTION OF THE PROSTATE (TURP)     Patient location during evaluation: PACU Anesthesia Type: General Level of consciousness: awake and alert Pain management: pain level controlled Vital Signs Assessment: post-procedure vital signs reviewed and stable Respiratory status: spontaneous breathing, nonlabored ventilation, respiratory function stable and patient connected to nasal cannula oxygen Cardiovascular status: blood pressure returned to baseline and stable Postop Assessment: no apparent nausea or vomiting Anesthetic complications: no   No notable events documented.  Last Vitals:  Vitals:   08/17/22 1000 08/17/22 1015  BP: 122/71 115/68  Pulse: 84 75  Resp: 15 14  Temp: 36.9 C   SpO2: 99% 100%    Last Pain:  Vitals:   08/17/22 1000  TempSrc:   PainSc: [place]

## 2022-08-17 NOTE — Op Note (Signed)
Preoperative diagnosis: Bladder outlet obstruction secondary to BPH  Postoperative diagnosis:  Bladder outlet obstruction secondary to BPH  Procedure:  Cystoscopy Transurethral resection of the prostate  Surgeon: Lillette Boxer. Soloman Mckeithan, M.D.  Anesthesia: General  Complications: None  Drain: Foley catheter  EBL: Minimal  Specimens: Prostate chips  Disposition of specimens: Pathology  Indication: Eduardo Watts. is a patient with bladder outlet obstruction secondary to benign prostatic hyperplasia. After reviewing the management options for treatment, he elected to proceed with the above surgical procedure(s). We have discussed the potential benefits and risks of the procedure, side effects of the proposed treatment, the likelihood of the patient achieving the goals of the procedure, and any potential problems that might occur during the procedure or recuperation. Informed consent has been obtained.  Description of procedure:  The patient was identified in the holding area. He received preoperative antibiotics. He was then taken to the operating room. General anesthetic was administered.  The patient was then placed in the dorsal lithotomy position, prepped and draped in the usual sterile fashion. Timeout was then performed.  A resectoscope sheath was placed using the visual obturator.   The bladder was then systematically examined in its entirety. There was no evidence of  tumors, stones, or other mucosal pathology.The resectoscope, loop and telescope were then placed.  The ureteral orifices were identified so as to be avoided during the procedure.  The prostate adenoma was then resected utilizing loop cautery resection with the bipolar cutting loop.  The prostate adenoma from the bladder neck back to the verumontanum was resected beginning at the six o'clock position and then extended to include the right and left lobes of the prostate and anterior prostate, respectively.  Care was taken not to resect distal to the verumontanum.  Hemostasis was then achieved with the cautery and the bladder was emptied and reinspected with no significant bleeding noted at the end of the procedure.  Resected chips were irrigated from the bladder with the evacuator and sent to pathology.  A 3 way catheter was then placed into the bladder and placed on continuous bladder irrigation.  The patient appeared to tolerate the procedure well and without complications. The patient was able to be awakened and transferred to the recovery unit in satisfactory condition. He tolerated the procedure well.

## 2022-08-17 NOTE — Interval H&P Note (Signed)
History and Physical Interval Note:  08/17/2022 7:35 AM  Eduardo Watts.  has presented today for surgery, with the diagnosis of BENIGN PROSTATIC HYPERPLASIA.  The various methods of treatment have been discussed with the patient and family. After consideration of risks, benefits and other options for treatment, the patient has consented to  Procedure(s) with comments: [place] (TURP) (N/A) - 90 MINS as a surgical intervention.  The patient's history has been reviewed, patient examined, no change in status, stable for surgery.  I have reviewed the patient's chart and labs.  Questions were answered to the patient's satisfaction.     Lillette Boxer Khylee Algeo

## 2022-08-17 NOTE — Anesthesia Procedure Notes (Signed)
Procedure Name: LMA Insertion Date/Time: 08/17/2022 7:48 AM  Performed by: Victoriano Lain, CRNAPre-anesthesia Checklist: Patient identified, Emergency Drugs available, Suction available, Patient being monitored and Timeout performed Patient Re-evaluated:Patient Re-evaluated prior to induction Oxygen Delivery Method: Circle system utilized Preoxygenation: Pre-oxygenation with 100% oxygen Induction Type: IV induction LMA: LMA with gastric port inserted LMA Size: 4.0 Number of attempts: 1 Placement Confirmation: positive ETCO2 and breath sounds checked- equal and bilateral Tube secured with: Tape Dental Injury: Teeth and Oropharynx as per pre-operative assessment

## 2022-08-18 ENCOUNTER — Encounter (HOSPITAL_COMMUNITY): Payer: Self-pay | Admitting: Urology

## 2022-08-18 DIAGNOSIS — J45909 Unspecified asthma, uncomplicated: Secondary | ICD-10-CM | POA: Diagnosis not present

## 2022-08-18 DIAGNOSIS — C61 Malignant neoplasm of prostate: Secondary | ICD-10-CM | POA: Diagnosis not present

## 2022-08-18 DIAGNOSIS — N320 Bladder-neck obstruction: Secondary | ICD-10-CM | POA: Diagnosis not present

## 2022-08-18 MED ORDER — OXYBUTYNIN CHLORIDE 5 MG PO TABS: 5.0000 mg | ORAL_TABLET | Freq: Three times a day (TID) | ORAL | 0 refills | Status: DC | PRN

## 2022-08-18 MED ORDER — SULFAMETHOXAZOLE-TRIMETHOPRIM 800-160 MG PO TABS: 1.0000 | ORAL_TABLET | Freq: Two times a day (BID) | ORAL | 0 refills | Status: DC

## 2022-08-18 NOTE — Progress Notes (Signed)
Mobility Specialist - Progress Note   08/18/22 1206  Mobility  Activity Ambulated with assistance in hallway  Level of Assistance Moderate assist, patient does 50-74%  Assistive Device Front wheel walker  Distance Ambulated (ft) 50 ft  Range of Motion/Exercises Active  Activity Response Tolerated well  Mobility Referral Yes  $Mobility charge 1 Mobility   Pt was found in bed and agreeable to ambulate. Stated not ambulating far due to his neuropathy. Pt was min-A going from supine>sitting and mod-A from sitting>standing. Was contact guard for ambulation and becomes a little more unsteady when turning around. At EOS returned to bed with all necessities in reach and wife in room. RN notified of session.  Ferd Hibbs Mobility Specialist

## 2022-08-22 LAB — SURGICAL PATHOLOGY

## 2022-08-22 NOTE — Discharge Summary (Addendum)
Date of admission: 08/17/2022  Date of discharge: 1.12.2024  Admission diagnosis: BPH    Discharge diagnosis: BPH  Secondary diagnoses: None  History and Physical: For full details, please see admission history and physical. Briefly, Eduardo Watts. is a 77 y.o. male with BPH.   Hospital Course:  The patient underwent transurethral resection of prostate on January 11. Continuous bladder irrigation was initiated post-operatively. He tolerated the procedure well and was transferred to the floor after receiving routine post-operative care. His diet was gradually advanced, and his pain was controlled with oral analgesics.  By POD1, his urine had cleared on slow drip CBI, so CBI was stopped. His hematuria remained mild with CBI stopped and after ambulation. CBI was disconnected, and the 3rd port of his foley catheter was plugged.   By later on POD1, he was tolerating a regular diet, ambulating, having good urine output with mild hematuria via foley catheter, and having pain well-controlled with oral analgesics. Thus, he was deemed appropriate for discharge home. His foley catheter was continued on discharge.  Laboratory values:  No results for input(s): "HGB", "HCT" in the last 72 hours. No results for input(s): "CREATININE" in the last 72 hours. Physical Exam:  General: Alert and oriented CV: Regular rate Lungs: NWOB on RA Abdomen: Soft, nondistended, nontender GU: Foley in place draining clear pink urine without clots  Extremities: Warm and well-perfused   Disposition: Home  Discharge instruction: The patient was instructed to be ambulatory but told to refrain from heavy lifting, strenuous activity, or driving while on narcotics.   Discharge medications:  Allergies as of 08/18/2022       Reactions   Zocor [simvastatin] Other (See Comments)   Muscle aches   Trazodone And Nefazodone Other (See Comments)   "crazy"        Medication List     TAKE these medications     ascorbic acid 500 MG tablet Commonly known as: VITAMIN C Take 500 mg by mouth daily.   carvedilol 3.125 MG tablet Commonly known as: COREG Take 3.125 mg by mouth at bedtime.   clotrimazole-betamethasone cream Commonly known as: LOTRISONE Apply 1 Application topically 2 (two) times daily as needed (itching).   DULoxetine 60 MG capsule Commonly known as: Cymbalta Take 1 capsule (60 mg total) by mouth daily.   ezetimibe 10 MG tablet Commonly known as: ZETIA Take 1 tablet (10 mg total) by mouth daily. What changed: when to take this   FISH OIL PO Take 1 capsule by mouth daily.   fluticasone 50 MCG/ACT nasal spray Commonly known as: FLONASE Place 1 spray into both nostrils daily.   hydrOXYzine 10 MG tablet Commonly known as: ATARAX TAKE 2 TABLETS BY MOUTH THREE TIMES DAILY AS NEEDED FOR ANXIETY What changed: See the new instructions.   IRON PO Take 145 mg by mouth daily.   LORazepam 0.5 MG tablet Commonly known as: ATIVAN TAKE 2 TABLETS BY MOUTH AT BEDTIME   oxybutynin 5 MG tablet Commonly known as: DITROPAN Take 1 tablet (5 mg total) by mouth every 8 (eight) hours as needed for bladder spasms.   sulfamethoxazole-trimethoprim 800-160 MG tablet Commonly known as: BACTRIM DS Take 1 tablet by mouth every 12 (twelve) hours.   thiamine 100 MG tablet Commonly known as: VITAMIN B1 Take 1 tablet (100 mg total) by mouth daily.   Zinc 50 MG Tabs Take 50 mg by mouth daily.          Followup:   Follow-up Information  Karen Kays, NP Follow up.   Specialty: Nurse Practitioner Why: 08/22/2022 @ 0815 Contact information: [address] [place] [address] [phone]

## 2022-08-24 ENCOUNTER — Other Ambulatory Visit: Payer: Self-pay | Admitting: Psychiatry

## 2022-08-24 DIAGNOSIS — F5105 Insomnia due to other mental disorder: Secondary | ICD-10-CM

## 2022-08-30 ENCOUNTER — Ambulatory Visit: Payer: Medicare Other | Admitting: Psychiatry

## 2022-08-31 ENCOUNTER — Other Ambulatory Visit: Payer: Self-pay | Admitting: Psychiatry

## 2022-08-31 DIAGNOSIS — G629 Polyneuropathy, unspecified: Secondary | ICD-10-CM

## 2022-08-31 DIAGNOSIS — F331 Major depressive disorder, recurrent, moderate: Secondary | ICD-10-CM

## 2022-08-31 DIAGNOSIS — F411 Generalized anxiety disorder: Secondary | ICD-10-CM

## 2022-09-04 ENCOUNTER — Other Ambulatory Visit: Payer: Self-pay | Admitting: Psychiatry

## 2022-09-04 DIAGNOSIS — F5105 Insomnia due to other mental disorder: Secondary | ICD-10-CM

## 2022-09-05 ENCOUNTER — Other Ambulatory Visit: Payer: Self-pay | Admitting: Psychiatry

## 2022-09-05 DIAGNOSIS — F5105 Insomnia due to other mental disorder: Secondary | ICD-10-CM

## 2022-09-07 DIAGNOSIS — R3914 Feeling of incomplete bladder emptying: Secondary | ICD-10-CM | POA: Diagnosis not present

## 2022-09-07 DIAGNOSIS — R3912 Poor urinary stream: Secondary | ICD-10-CM | POA: Diagnosis not present

## 2022-09-13 DIAGNOSIS — J42 Unspecified chronic bronchitis: Secondary | ICD-10-CM | POA: Diagnosis not present

## 2022-09-19 ENCOUNTER — Telehealth: Payer: Self-pay | Admitting: Psychiatry

## 2022-09-19 NOTE — Telephone Encounter (Signed)
This is one Dr. [name] needs to address.

## 2022-09-19 NOTE — Telephone Encounter (Signed)
Next appt 3/12

## 2022-09-19 NOTE — Telephone Encounter (Signed)
Pt's wife LVM@ 1:15p.   She said the Duloxetine that the pt is on is "backfiring".  She said he is "not doing well" and "it's really not good".  Pls call her to discuss.

## 2022-09-19 NOTE — Telephone Encounter (Signed)
Personality is flat. Lost coordination, not drinking , loss of interest. Falling sits like a zombie. Sleeping more than awake. Falls backward. Not able to drive.Wife needs advice

## 2022-09-23 ENCOUNTER — Other Ambulatory Visit: Payer: Self-pay | Admitting: Psychiatry

## 2022-09-23 DIAGNOSIS — F5105 Insomnia due to other mental disorder: Secondary | ICD-10-CM

## 2022-09-27 NOTE — Telephone Encounter (Signed)
LM to call back with update and information, unless there is another message

## 2022-10-12 DIAGNOSIS — J42 Unspecified chronic bronchitis: Secondary | ICD-10-CM | POA: Diagnosis not present

## 2022-10-17 ENCOUNTER — Ambulatory Visit: Payer: Medicare Other | Admitting: Psychiatry

## 2022-10-17 NOTE — Progress Notes (Signed)
Pt claims to have cancelled appt

## 2022-10-25 ENCOUNTER — Other Ambulatory Visit: Payer: Self-pay | Admitting: Psychiatry

## 2022-10-25 DIAGNOSIS — F5105 Insomnia due to other mental disorder: Secondary | ICD-10-CM

## 2022-10-31 ENCOUNTER — Ambulatory Visit: Payer: Medicare Other | Admitting: Physician Assistant

## 2022-11-10 ENCOUNTER — Encounter: Payer: Self-pay | Admitting: Podiatry

## 2022-11-10 ENCOUNTER — Ambulatory Visit: Payer: Medicare Other | Admitting: Podiatry

## 2022-11-10 DIAGNOSIS — G629 Polyneuropathy, unspecified: Secondary | ICD-10-CM | POA: Diagnosis not present

## 2022-11-10 DIAGNOSIS — M79675 Pain in left toe(s): Secondary | ICD-10-CM | POA: Diagnosis not present

## 2022-11-10 DIAGNOSIS — B351 Tinea unguium: Secondary | ICD-10-CM | POA: Diagnosis not present

## 2022-11-10 DIAGNOSIS — M79674 Pain in right toe(s): Secondary | ICD-10-CM

## 2022-11-10 NOTE — Progress Notes (Signed)
This patient returns to my office for at risk foot care.  This patient requires this care by a professional since this patient will be at risk due to having neuropathy.  Patient is taking neurontin. Patient was referred to this office by Dr.  Tenny Craw.  This patient is unable to cut nails himself since the patient cannot reach his nails.These nails are painful walking and wearing shoes.  This patient presents for at risk foot care today.  General Appearance  Alert, conversant and in no acute stress.  Vascular  Dorsalis pedis and posterior tibial  pulses are palpable  bilaterally.  Capillary return is within normal limits  bilaterally. Temperature is within normal limits  bilaterally.  Neurologic  Senn-Weinstein monofilament wire test diminished  bilaterally. Muscle power within normal limits bilaterally.  Nails Thick disfigured discolored nails with subungual debris  hallux nails  bilaterally. No evidence of bacterial infection or drainage bilaterally.  Orthopedic  No limitations of motion  feet .  No crepitus or effusions noted.  No bony pathology or digital deformities noted.  HAV  right foot.  Skin  normotropic skin with no porokeratosis noted bilaterally.  No signs of infections or ulcers noted.     Onychomycosis  Pain in right toes  Pain in left toes  Consent was obtained for treatment procedures.   Mechanical debridement of nails 1-5  bilaterally performed with a nail nipper.  Filed with dremel without incident.  .       Return office visit  3   months                    Told patient to return for periodic foot care and evaluation due to potential at risk complications.   Helane Gunther DPM

## 2022-11-12 DIAGNOSIS — J42 Unspecified chronic bronchitis: Secondary | ICD-10-CM | POA: Diagnosis not present

## 2022-11-22 ENCOUNTER — Other Ambulatory Visit: Payer: Self-pay | Admitting: Psychiatry

## 2022-11-22 DIAGNOSIS — F99 Mental disorder, not otherwise specified: Secondary | ICD-10-CM

## 2022-11-22 NOTE — Telephone Encounter (Signed)
Please call to schedule appt.  Provider cancellation 4/25.

## 2022-11-23 NOTE — Telephone Encounter (Signed)
Pt is scheduled in june

## 2022-11-23 NOTE — Telephone Encounter (Signed)
Put him on cancellation list.   

## 2022-11-25 ENCOUNTER — Other Ambulatory Visit: Payer: Self-pay | Admitting: Psychiatry

## 2022-11-25 DIAGNOSIS — F5105 Insomnia due to other mental disorder: Secondary | ICD-10-CM

## 2022-11-29 ENCOUNTER — Other Ambulatory Visit: Payer: Self-pay | Admitting: Psychiatry

## 2022-11-29 DIAGNOSIS — F411 Generalized anxiety disorder: Secondary | ICD-10-CM

## 2022-11-29 DIAGNOSIS — F331 Major depressive disorder, recurrent, moderate: Secondary | ICD-10-CM

## 2022-11-29 DIAGNOSIS — G629 Polyneuropathy, unspecified: Secondary | ICD-10-CM

## 2022-11-30 ENCOUNTER — Ambulatory Visit: Payer: Medicare Other | Admitting: Psychiatry

## 2022-12-08 DIAGNOSIS — R3914 Feeling of incomplete bladder emptying: Secondary | ICD-10-CM | POA: Diagnosis not present

## 2022-12-12 DIAGNOSIS — J42 Unspecified chronic bronchitis: Secondary | ICD-10-CM | POA: Diagnosis not present

## 2022-12-19 DIAGNOSIS — D1801 Hemangioma of skin and subcutaneous tissue: Secondary | ICD-10-CM | POA: Diagnosis not present

## 2022-12-19 DIAGNOSIS — L218 Other seborrheic dermatitis: Secondary | ICD-10-CM | POA: Diagnosis not present

## 2022-12-19 DIAGNOSIS — L570 Actinic keratosis: Secondary | ICD-10-CM | POA: Diagnosis not present

## 2022-12-19 DIAGNOSIS — L812 Freckles: Secondary | ICD-10-CM | POA: Diagnosis not present

## 2022-12-19 DIAGNOSIS — L720 Epidermal cyst: Secondary | ICD-10-CM | POA: Diagnosis not present

## 2022-12-19 DIAGNOSIS — L821 Other seborrheic keratosis: Secondary | ICD-10-CM | POA: Diagnosis not present

## 2022-12-22 ENCOUNTER — Other Ambulatory Visit: Payer: Self-pay | Admitting: Psychiatry

## 2022-12-22 DIAGNOSIS — E1140 Type 2 diabetes mellitus with diabetic neuropathy, unspecified: Secondary | ICD-10-CM | POA: Diagnosis not present

## 2022-12-22 DIAGNOSIS — F5105 Insomnia due to other mental disorder: Secondary | ICD-10-CM

## 2022-12-22 DIAGNOSIS — E559 Vitamin D deficiency, unspecified: Secondary | ICD-10-CM | POA: Diagnosis not present

## 2022-12-22 DIAGNOSIS — I10 Essential (primary) hypertension: Secondary | ICD-10-CM | POA: Diagnosis not present

## 2022-12-22 DIAGNOSIS — E782 Mixed hyperlipidemia: Secondary | ICD-10-CM | POA: Diagnosis not present

## 2023-01-11 ENCOUNTER — Other Ambulatory Visit: Payer: Self-pay | Admitting: Psychiatry

## 2023-01-11 DIAGNOSIS — F5105 Insomnia due to other mental disorder: Secondary | ICD-10-CM

## 2023-01-12 DIAGNOSIS — J42 Unspecified chronic bronchitis: Secondary | ICD-10-CM | POA: Diagnosis not present

## 2023-01-23 ENCOUNTER — Other Ambulatory Visit: Payer: Self-pay | Admitting: Psychiatry

## 2023-01-23 DIAGNOSIS — F5105 Insomnia due to other mental disorder: Secondary | ICD-10-CM

## 2023-01-30 ENCOUNTER — Encounter: Payer: Self-pay | Admitting: Psychiatry

## 2023-01-30 ENCOUNTER — Ambulatory Visit: Payer: Medicare Other | Admitting: Psychiatry

## 2023-01-30 DIAGNOSIS — F99 Mental disorder, not otherwise specified: Secondary | ICD-10-CM

## 2023-01-30 DIAGNOSIS — F411 Generalized anxiety disorder: Secondary | ICD-10-CM

## 2023-01-30 DIAGNOSIS — F331 Major depressive disorder, recurrent, moderate: Secondary | ICD-10-CM

## 2023-01-30 DIAGNOSIS — G629 Polyneuropathy, unspecified: Secondary | ICD-10-CM | POA: Diagnosis not present

## 2023-01-30 DIAGNOSIS — F5105 Insomnia due to other mental disorder: Secondary | ICD-10-CM

## 2023-01-30 MED ORDER — HYDROXYZINE HCL 10 MG PO TABS: ORAL_TABLET | ORAL | 1 refills | Status: DC

## 2023-01-30 MED ORDER — LORAZEPAM 1 MG PO TABS: 1.0000 mg | ORAL_TABLET | Freq: Every day | ORAL | 5 refills | Status: DC

## 2023-01-30 MED ORDER — DULOXETINE HCL 60 MG PO CPEP: 60.0000 mg | ORAL_CAPSULE | Freq: Every day | ORAL | 1 refills | Status: DC

## 2023-01-30 NOTE — Progress Notes (Signed)
Eduardo Watts [id number] [birth year] 77 y.o.   Subjective:   Patient ID:  Eduardo Watts. is a 77 y.o. (DOB 04/27/1946) male.  Chief Complaint:  Chief Complaint  Patient presents with   Follow-up   Depression   Anxiety    Anxiety Symptoms include dizziness. Patient reports no chest pain, confusion, decreased concentration, nervous/anxious behavior, palpitations or suicidal ideas.    Depression        Associated symptoms include no decreased concentration and no suicidal ideas.  Past medical history includes anxiety.   Eduardo Watts. Eduardo Watts presents to the office today for follow-up of sleep, anxiety and alcohol abuse.  seen 11/2019.  No meds changed.  He was not significantly depressed or anxious but was needing medication for insomnia. Meds: Flexeril 10 HS, Gabapentin 300 HS for neuropathy helps a little, lorazepam 0.5mg  2 HS,   sleeps good with that.  05/06/20 appt noted: CO neuropathy in feet with numbness and tingling in feet.   Seeing neurologist.  No change in meds so far.  He's prediabetic. This is causing a balance issue.   Sleep well with lorazepam.  Vaccinated. Plan: no changes  08/26/2020 appt noted: [name] Dizziness when gets up in the AM.  Neuropathy problems.  Increased gabapentin which he seemed to tolerate.  Fall the other day.  Energy not as good.  Lorazepam & hydroxyzine still helps sleep.  Celebrated 50 year anniversary party.  Good time.    Wife concerned about his meds and wonders if they are affecting his meds.    02/17/2021 appt noted: Neuropathy worse.  Tried gabapentin without help.  Balance is off badly.  Can't shower standing up.  Not much pain.  On Lyrica.   Sleep well with meds about 6 hours.  Can get up at night to bedtime without problems.   Drinking a bottle of wine per day Wife concerned about his alcohol.  Doesn't want to attend AA bc of having to talk in public over it. Patient reports stable mood and denies depressed or  irritable moods usually.  Patient denies any recent difficulty with anxiety.  Patient denies difficulty with sleep initiation or maintenance. Ativan 1 mg and hydroxyzine help sleep.  Some mild EMA.  8 hours.   Denies appetite disturbance.  Patient reports that appetite,  and motivation have been good.  Patient denies any difficulty with concentration.  Patient denies any suicidal ideation.  Occ name recall problems.  12/28/21 appt noted: CC neuropathy with several consults. Neuropathy causing depression. F died 2 and 1/2 years ago. Numbness, stinging, loss of balance esp in shower. Loss of interests, reduced interests reading, Bible, church.  Thinking about best days behind him.  06/07/22 appt noted: Upset over [name] decisions.   She is their only child.   He and wife very upset.  Happened in the summer. 1 GD 6th grade.  D and ex-son-in-law share custody.   Never tried duloxetine 60 and no benefit with 30 mg daily for neuropathy. No SE.  Has had some falls DT neuropathy.  Cracked a rib and hurt arm.  Neuro work up was neg.    01/30/23 appt noted: Eduardo Watts fell 2 mos ago and broke 3 vertebrae and paralyzed waist down.  She is walking again.   All her life weakness on R side.  Predisposed to falling.  Is doing fair.  Psych meds: duloxetine 60, lorazepam 1 mg HS, hydroxyzine 30 mg HS. Ongoing stress with [name] behavior.  Mentally OK not perfect.  Physically can tell effect.  Neuropathy is persistent and  uncurable.  Numb feet and ankles.  Search internet all the time for it.  Causes px with balance.  Working to cope with it.  Had some falls early on.  Still doing some work and it helps a little.  Something positive to focus on. Dependent on lorazepam 1 mg HS helps sleep with hydroxyzine 20 mg HS.  No SE except a little dull in the AM.  Prostate surgery helped with bladder issues somewhat.   Not full control of alcohol.  Appt with PCP July to evaluate liver enzymes.  Down to 1/2 bottle wine per day. Also  dx with fatty liver.   Dr. [name]  Past Psychiatric Medication Trials:  Ambien fell out of bed, Ativan, Flexeril, Belsomra, hydrozyzine, Xanax hives, Prosom, trazodone NR, No Seroquel DT B 's suicide, cyclobenzaprine helps,  Klonopin SE, Librium detox,  Gabapentin SE, Lyrica NR Lithium,  fluoxetine, duloxetine topiramate, propranolol, Naltrexone   Review of Systems:  Review of Systems  Cardiovascular:  Negative for chest pain and palpitations.  Genitourinary:  Positive for difficulty urinating.  Neurological:  Positive for dizziness and numbness. Negative for tremors.       Neuropathy in feet affecting  balance  Psychiatric/Behavioral:  Positive for sleep disturbance. Negative for agitation, behavioral problems, confusion, decreased concentration, dysphoric mood, hallucinations, self-injury and suicidal ideas. The patient is not nervous/anxious and is not hyperactive.     Medications: I have reviewed the patient's current medications.  Current Outpatient Medications  Medication Sig Dispense Refill   ascorbic acid (VITAMIN C) 500 MG tablet Take 500 mg by mouth daily.     carvedilol (COREG) 3.125 MG tablet Take 3.125 mg by mouth at bedtime.     clotrimazole-betamethasone (LOTRISONE) cream Apply 1 Application topically 2 (two) times daily as needed (itching).     ezetimibe (ZETIA) 10 MG tablet Take 1 tablet (10 mg total) by mouth daily. (Patient taking differently: Take 10 mg by mouth at bedtime.) 30 tablet 11   fluticasone (FLONASE) 50 MCG/ACT nasal spray Place 1 spray into both nostrils daily.      Omega-3 Fatty Acids (FISH OIL PO) Take 1 capsule by mouth daily.     oxybutynin (DITROPAN) 5 MG tablet Take 1 tablet (5 mg total) by mouth every 8 (eight) hours as needed for bladder spasms. 15 tablet 0   sulfamethoxazole-trimethoprim (BACTRIM DS) 800-160 MG tablet Take 1 tablet by mouth every 12 (twelve) hours. 6 tablet 0   thiamine 100 MG tablet Take 1 tablet (100 mg total) by mouth  daily. 30 tablet 0   Zinc 50 MG TABS Take 50 mg by mouth daily.     DULoxetine (CYMBALTA) 60 MG capsule Take 1 capsule (60 mg total) by mouth daily. 90 capsule 1   Ferrous Sulfate (IRON PO) Take 145 mg by mouth daily.     hydrOXYzine (ATARAX) 10 MG tablet TAKE 2 TABLETS BY MOUTH THREE TIMES DAILY AS NEEDED FOR ANXIETY 180 tablet 1   LORazepam (ATIVAN) 1 MG tablet Take 1 tablet (1 mg total) by mouth at bedtime. 30 tablet 5   No current facility-administered medications for this visit.    Medication Side Effects: None  Allergies:  Allergies  Allergen Reactions   Zocor [Simvastatin] Other (See Comments)    Muscle aches   Trazodone And Nefazodone Other (See Comments)    "crazy"    Past Medical History:  Diagnosis Date  Anxiety    Asthma    as a child   Depression    sees Dr Jennelle Human   History of BPH    Dr.Dahlstedt   Hyperlipidemia    Pre-diabetes    Prediabetes    Sleep disturbances     Family History  Problem Relation Age of Onset   Dementia Mother    Stroke Mother    Cancer - Prostate Father    Melanoma Father    Hyperlipidemia Sister        high cholestrol   Depression Brother    Suicidality Brother    Lung disease Maternal Grandfather    Heart attack Neg Hx    Hypertension Neg Hx     Social History   Socioeconomic History   Marital status: Married    Spouse name: Not on file   Number of children: 1   Years of education: 17   Highest education level: Not on file  Occupational History   Not on file  Tobacco Use   Smoking status: Never   Smokeless tobacco: Never  Vaping Use   Vaping Use: Never used  Substance and Sexual Activity   Alcohol use: Yes    Alcohol/week: 14.0 - 21.0 standard drinks of alcohol    Types: 14 - 21 Glasses of wine per week    Comment: occ wine   Drug use: No   Sexual activity: Not on file  Other Topics Concern   Not on file  Social History Narrative   Never smoked   Alcohol yes -2-3 glasses of wine/night   Caffeine- yes  1 cup of coffee/day   Recreational drug use -No   Diet- fruits and vegetables,less fried food more pasta   Exercise- decrease to fatigue ,does yard work,and  walks occasionally    Occupation -employed Airline pilot   Married 1 daughter/ 1 granddaughter   Goes by [employer]" loves animals   Social Determinants of Health   Financial Resource Strain: Not on file  Food Insecurity: No Food Insecurity (08/17/2022)   Hunger Vital Sign    Worried About Running Out of Food in the Last Year: Never true    Ran Out of Food in the Last Year: Never true  Transportation Needs: No Transportation Needs (08/17/2022)   PRAPARE - Administrator, Civil Service (Medical): No    Lack of Transportation (Non-Medical): No  Physical Activity: Not on file  Stress: Not on file  Social Connections: Not on file  Intimate Partner Violence: Not At Risk (08/17/2022)   Humiliation, Afraid, Rape, and Kick questionnaire    Fear of Current or Ex-Partner: No    Emotionally Abused: No    Physically Abused: No    Sexually Abused: No    Past Medical History, Surgical history, Social history, and Family history were reviewed and updated as appropriate.   Please see review of systems for further details on the patient's review from today.   Objective:   Physical Exam:  There were no vitals taken for this visit.  Physical Exam Constitutional:      General: He is not in acute distress.    Appearance: He is well-developed.  Musculoskeletal:        General: No deformity.  Neurological:     Mental Status: He is alert and oriented to person, place, and time.     Motor: No tremor.     Coordination: Coordination abnormal.     Gait: Gait abnormal.  Psychiatric:  Attention and Perception: Attention and perception normal.        Mood and Affect: Mood is not anxious or depressed. Affect is not labile, blunt, angry or inappropriate.        Speech: Speech normal. Speech is not rapid and pressured.        Behavior:  Behavior normal.        Thought Content: Thought content normal. Thought content is not delusional. Thought content does not include homicidal or suicidal ideation. Thought content does not include suicidal plan.        Cognition and Memory: Cognition normal.        Judgment: Judgment normal.     Comments: Insight intact. No auditory or visual hallucinations. No delusions.  More stressed.  Not as dep.       Lab Review:     Component Value Date/Time   NA 138 08/09/2022 0820   NA 136 05/24/2017 0845   K 4.7 08/09/2022 0820   CL 103 08/09/2022 0820   CO2 25 08/09/2022 0820   GLUCOSE 187 (H) 08/09/2022 0820   BUN 15 08/09/2022 0820   BUN 11 05/24/2017 0845   CREATININE 1.02 08/09/2022 0820   CALCIUM 9.5 08/09/2022 0820   PROT 6.7 10/31/2020 0528   PROT 7.0 04/15/2019 0854   ALBUMIN 3.7 10/31/2020 0528   ALBUMIN 4.5 05/24/2017 0845   AST 27 10/31/2020 0528   ALT 27 10/31/2020 0528   ALKPHOS 86 10/31/2020 0528   BILITOT 0.7 10/31/2020 0528   BILITOT 1.1 05/24/2017 0845   GFRNONAA >60 08/09/2022 0820   GFRAA 99 05/24/2017 0845       Component Value Date/Time   WBC 7.5 08/09/2022 0820   RBC 4.93 08/09/2022 0820   HGB 15.8 08/09/2022 0820   HCT 48.1 08/09/2022 0820   PLT 262 08/09/2022 0820   MCV 97.6 08/09/2022 0820   MCH 32.0 08/09/2022 0820   MCHC 32.8 08/09/2022 0820   RDW 14.0 08/09/2022 0820   LYMPHSABS 0.8 10/31/2020 0528   MONOABS 0.7 10/31/2020 0528   EOSABS 0.2 10/31/2020 0528   BASOSABS 0.0 10/31/2020 0528    No results found for: "POCLITH", "LITHIUM"   No results found for: "PHENYTOIN", "PHENOBARB", "VALPROATE", "CBMZ"   .res Assessment: Plan:    Major depressive disorder, recurrent episode, moderate (HCC) - Plan: DULoxetine (CYMBALTA) 60 MG capsule  Generalized anxiety disorder - Plan: DULoxetine (CYMBALTA) 60 MG capsule  Insomnia due to other mental disorder - Plan: LORazepam (ATIVAN) 1 MG tablet, hydrOXYzine (ATARAX) 10 MG tablet  Neuropathy -  Plan: DULoxetine (CYMBALTA) 60 MG capsule   Disc sleep med options including no meds.  Wife complains about him taking sleep meds.  Chronic insomnia otherwise. Needs the meds.  Tolerating meds.  Answered questions about diagnosis and prognosis.  Disc this at length.  He has well controled psych sx.   Answered questions about purpose of each med.  We discussed the short-term risks associated with benzodiazepines including sedation and increased fall risk among others.  Discussed long-term side effect risk including dependence, potential withdrawal symptoms, and the potential eventual dose-related risk of dementia.  Newer studies refute the risk Also discussed the consequences of poor sleep on brain and body health.  Also some risk from hydroxyzine also causing short-term problems.  He's doing well without SE so no changes.    Sleep hygiene. Disc timing of sleep meds to help him stay asleep.  Disc option increase hydroxyzine and hold flexeril or vice versa.  Flexeril  helps back pain.  Lorazepam helps sleep as well.  Manages well with 6 hours of sleep.  Disc risk of multiple sleepers.  Overall he's doing well and sleep meds due not appear to be cause of sleepiness.  But discuss the reasons for it.  Disc fall risks with combo and protecting memorhy  Extensive discussion of distinguishing dizziness related neuropathy.  Disc importance of managing glucose to reduce rate of progression of neuropathy.  Doing better with alcohol. Counseling 20 min:  reinforced  the 12 steps, counseled re: medical aspects alcohol esp in relation with meds, educ about liver dz (dx fatty liver) and alcohol and meds.  Supportivve tx re family px with wifes' injury an d [name] decisions hurting family.  He is more depressed nor markedly anxious at this time.  He does need the medications for insomnia as noted.  Sleep better Lorazepam 1 mg and hydroxyzine 20 mg HS  For neuropathy and pain and depression:  icontinue duloxetine to  60 daily  FU 6-8 weeks.  Meredith Staggers, MD, DFAPA     Please see After Visit Summary for patient specific instructions.  Future Appointments  Date Time Provider Department Center  02/02/2023  9:00 AM Waymon Budge, MD [facility] None  02/16/2023  7:45 AM Helane Gunther, DPM [facility]    No orders of the defined types were placed in this encounter.     -------------------------------

## 2023-01-31 NOTE — Progress Notes (Signed)
HPI male never smoker followed for cough, mild obstructive airways disease, complicated by CAD, palpitations, old right rib fractures PFT 07/13/14- minimal obstructive airways disease, insignificant response to bronchodilator, normal lung volumes and diffusion. Echocardiogram 08/11/2013-normal LV function, normal valves, no evidence of prior MI Walk Test-04/02/2015-he walked 555 feet starting with saturation 93%, falling to a nadir of 92% on room air with no oxygen desaturation. Pulse rate 100-109, regular. ONOX- 04/25/15- qualified for sleep O2 with 50 minutes<= 88% room air .NPSG 09/29/15- WNL, AHI 3/ hr, with desat to 85%, mean sat 90.1% ----------------------------------------------------------------------   01/30/22- 77 year old male never smoker followed for Chronic hypoxic respiratory / Nocturnal Hypoxemia, cough, mild Obstructive Airways Disease, old right rib fractures complicated by CAD, palpitations, Insomnia/ Anxiet/ ETOH/Depression Dr Jennelle Human, DM2, Hyperlipidemia, Neuropathy O2 2 L/sleep/[facility]  Covid vax-3 [employer] Body weight today-190 lbs He feels comfortable with his breathing and denies cough or wheeze.  Still sleeps with oxygen every night. Now dealing with peripheral neuropathy in his feet and admits some unsteadiness.  Has a walker at home but did not bring it. Complains of cerumen impaction right ear.  Had been advised to get a cerumen kit but had not done so and he asks I look at it.  He intends to follow-up on this with his primary physician. CXR 10/31/20 1V- FINDINGS: Low volume chest with mild atelectatic type opacity at the bases. There is no edema, consolidation, effusion, or pneumothorax. Normal heart size and mediastinal contours. IMPRESSION: Low volume chest with mild atelectasis.  02/02/23- 77 year old male never smoker followed for Chronic hypoxic respiratory / Nocturnal Hypoxemia, cough, mild Obstructive Airways Disease, old right rib fractures complicated by CAD,  palpitations, Insomnia/ Anxiet/ ETOH/Depression Dr Jennelle Human, DM2, Hyperlipidemia, Neuropathy O2 2 L/sleep/[facility]  Covid vax-3 [employer]izer Body weight today-191 lbs -----Breathing is good.  Uses O2 about 4 nights/ week- takes it off about 2 AM. Discussed. Lorazepam helps sleep. Peripheral neuropathy discomfort impacts sleep.  Had TURP.  ROS-see HPI  + = positive Constitutional:   No-   weight loss, night sweats, fevers, chills, fatigue, lassitude. HEENT:   No-  headaches, difficulty swallowing, tooth/dental problems, sore throat,       No-  sneezing, itching, ear ache, nasal congestion, post nasal drip,  CV:  No-   chest pain, orthopnea, PND, swelling in lower extremities, anasarca,                              +dizziness, +palpitations Resp: +shortness of breath with exertion or at rest.              No-   productive cough,  non-productive cough,  No- coughing up of blood.              No-   change in color of mucus.  No- wheezing.   Skin: No-   rash or lesions. GI:  No-   heartburn, indigestion, abdominal pain, nausea, vomiting,  GU: . MS:  No-   joint pain or swelling.   Neuro-     + numbness/ peripheral neuropathy Psych:  No- change in mood or affect. + depression or anxiety.  No memory loss.  OBJ- Physical Exam    General- Alert, Oriented, Affect-appropriate, Distress- none acute, not obese Skin- rash-none, lesions- none, excoriation- none Lymphadenopathy- none Head- atraumatic            Eyes- Gross vision intact, PERRLA, conjunctivae and secretions clear  Ears- + cerumen obstructing right canal            Nose- Clear, no-Septal dev, mucus, polyps, erosion, perforation             Throat- Mallampati II/+ uvulopalatoplasty , mucosa clear , drainage- none, tonsils-  atrophic,  Neck- flexible , trachea midline, no stridor , thyroid nl, carotid no bruit Chest - symmetrical excursion , unlabored           Heart/CV- RRR , no murmur , no gallop  , no rub, nl s1 s2                            - JVD- none , edema- none, stasis changes- none, varices- none           Lung- +clear, wheeze- none, cough- none , dullness-none, rub- none           Chest wall-; + asymmetry chest wall at sternum consistent with old rib fractures and rib cage distortion. Abd-  Br/ Gen/ Rectal- Not done, not indicated Extrem-  Neuro- +R corner of mouth droops

## 2023-02-02 ENCOUNTER — Ambulatory Visit: Payer: Medicare Other | Admitting: Internal Medicine

## 2023-02-02 ENCOUNTER — Encounter: Payer: Self-pay | Admitting: Internal Medicine

## 2023-02-02 VITALS — BP 138/80 | HR 75 | Ht 74.0 in | Wt 191.0 lb

## 2023-02-02 DIAGNOSIS — G4734 Idiopathic sleep related nonobstructive alveolar hypoventilation: Secondary | ICD-10-CM | POA: Diagnosis not present

## 2023-02-02 DIAGNOSIS — G629 Polyneuropathy, unspecified: Secondary | ICD-10-CM

## 2023-02-02 NOTE — Patient Instructions (Signed)
We can continue O2 2L for sleep  Please call if we can help

## 2023-02-11 DIAGNOSIS — J42 Unspecified chronic bronchitis: Secondary | ICD-10-CM | POA: Diagnosis not present

## 2023-02-16 ENCOUNTER — Encounter: Payer: Self-pay | Admitting: Podiatry

## 2023-02-16 ENCOUNTER — Ambulatory Visit: Payer: Medicare Other | Admitting: Podiatry

## 2023-02-16 DIAGNOSIS — M79675 Pain in left toe(s): Secondary | ICD-10-CM

## 2023-02-16 DIAGNOSIS — M79674 Pain in right toe(s): Secondary | ICD-10-CM

## 2023-02-16 DIAGNOSIS — B351 Tinea unguium: Secondary | ICD-10-CM | POA: Diagnosis not present

## 2023-02-16 DIAGNOSIS — G629 Polyneuropathy, unspecified: Secondary | ICD-10-CM

## 2023-02-16 NOTE — Progress Notes (Signed)
This patient returns to my office for at risk foot care.  This patient requires this care by a professional since this patient will be at risk due to having neuropathy.  Patient is taking neurontin. Patient was referred to this office by Dr.  Tenny Craw.  This patient is unable to cut nails himself since the patient cannot reach his nails.These nails are painful walking and wearing shoes.  This patient presents for at risk foot care today.  General Appearance  Alert, conversant and in no acute stress.  Vascular  Dorsalis pedis and posterior tibial  pulses are palpable  bilaterally.  Capillary return is within normal limits  bilaterally. Temperature is within normal limits  bilaterally.  Neurologic  Senn-Weinstein monofilament wire test diminished  bilaterally. Muscle power within normal limits bilaterally.  Nails Thick disfigured discolored nails with subungual debris  hallux nails  left.. No evidence of bacterial infection or drainage bilaterally.  Orthopedic  No limitations of motion  feet .  No crepitus or effusions noted.  No bony pathology or digital deformities noted.  HAV  right foot.  Skin  normotropic skin with no porokeratosis noted bilaterally.  No signs of infections or ulcers noted.     Onychomycosis  Pain in right toes  Pain in left toes  Consent was obtained for treatment procedures.   Mechanical debridement of nails 1-5  bilaterally performed with a nail nipper.  Filed with dremel without incident.  .       Return office visit  3   months                    Told patient to return for periodic foot care and evaluation due to potential at risk complications.   Helane Gunther DPM

## 2023-02-19 ENCOUNTER — Other Ambulatory Visit: Payer: Self-pay | Admitting: Psychiatry

## 2023-02-19 DIAGNOSIS — F5105 Insomnia due to other mental disorder: Secondary | ICD-10-CM

## 2023-02-26 DIAGNOSIS — R748 Abnormal levels of other serum enzymes: Secondary | ICD-10-CM | POA: Diagnosis not present

## 2023-02-26 DIAGNOSIS — Z0000 Encounter for general adult medical examination without abnormal findings: Secondary | ICD-10-CM | POA: Diagnosis not present

## 2023-02-26 DIAGNOSIS — E1142 Type 2 diabetes mellitus with diabetic polyneuropathy: Secondary | ICD-10-CM | POA: Diagnosis not present

## 2023-02-26 DIAGNOSIS — R2689 Other abnormalities of gait and mobility: Secondary | ICD-10-CM | POA: Diagnosis not present

## 2023-02-26 DIAGNOSIS — Z9181 History of falling: Secondary | ICD-10-CM | POA: Diagnosis not present

## 2023-02-26 DIAGNOSIS — E782 Mixed hyperlipidemia: Secondary | ICD-10-CM | POA: Diagnosis not present

## 2023-02-26 DIAGNOSIS — G629 Polyneuropathy, unspecified: Secondary | ICD-10-CM | POA: Diagnosis not present

## 2023-02-26 DIAGNOSIS — I10 Essential (primary) hypertension: Secondary | ICD-10-CM | POA: Diagnosis not present

## 2023-03-14 DIAGNOSIS — J42 Unspecified chronic bronchitis: Secondary | ICD-10-CM | POA: Diagnosis not present

## 2023-03-18 ENCOUNTER — Encounter: Payer: Self-pay | Admitting: Internal Medicine

## 2023-03-18 NOTE — Assessment & Plan Note (Signed)
Discomfort from this impacts sleep. Lorazepam helps.

## 2023-03-18 NOTE — Assessment & Plan Note (Signed)
Hypoventilation Advised more consistent use of O2 during sleep. Consider update oximetry

## 2023-04-14 DIAGNOSIS — J42 Unspecified chronic bronchitis: Secondary | ICD-10-CM | POA: Diagnosis not present

## 2023-05-14 DIAGNOSIS — R748 Abnormal levels of other serum enzymes: Secondary | ICD-10-CM | POA: Diagnosis not present

## 2023-05-14 DIAGNOSIS — E1140 Type 2 diabetes mellitus with diabetic neuropathy, unspecified: Secondary | ICD-10-CM | POA: Diagnosis not present

## 2023-05-14 DIAGNOSIS — R945 Abnormal results of liver function studies: Secondary | ICD-10-CM | POA: Diagnosis not present

## 2023-05-30 ENCOUNTER — Ambulatory Visit: Payer: Medicare Other | Admitting: Psychiatry

## 2023-06-01 ENCOUNTER — Ambulatory Visit: Payer: Medicare Other | Admitting: Podiatry

## 2023-06-01 ENCOUNTER — Encounter: Payer: Self-pay | Admitting: Podiatry

## 2023-06-01 DIAGNOSIS — M79674 Pain in right toe(s): Secondary | ICD-10-CM

## 2023-06-01 DIAGNOSIS — M79675 Pain in left toe(s): Secondary | ICD-10-CM | POA: Diagnosis not present

## 2023-06-01 DIAGNOSIS — G629 Polyneuropathy, unspecified: Secondary | ICD-10-CM

## 2023-06-01 DIAGNOSIS — B351 Tinea unguium: Secondary | ICD-10-CM | POA: Diagnosis not present

## 2023-06-01 NOTE — Progress Notes (Signed)
This patient returns to my office for at risk foot care.  This patient requires this care by a professional since this patient will be at risk due to having neuropathy.  Patient is taking neurontin. Patient was referred to this office by Dr.  Tenny Craw.  This patient is unable to cut nails himself since the patient cannot reach his nails.These nails are painful walking and wearing shoes.  This patient presents for at risk foot care today.  General Appearance  Alert, conversant and in no acute stress.  Vascular  Dorsalis pedis and posterior tibial  pulses are palpable  bilaterally.  Capillary return is within normal limits  bilaterally. Temperature is within normal limits  bilaterally.  Neurologic  Senn-Weinstein monofilament wire test diminished  bilaterally. Muscle power within normal limits bilaterally.  Nails Thick disfigured discolored nails with subungual debris  hallux nails  left.. No evidence of bacterial infection or drainage bilaterally.  Orthopedic  No limitations of motion  feet .  No crepitus or effusions noted.  No bony pathology or digital deformities noted.  HAV  right foot.  Skin  normotropic skin with no porokeratosis noted bilaterally.  No signs of infections or ulcers noted.     Onychomycosis  Pain in right toes  Pain in left toes  Consent was obtained for treatment procedures.   Mechanical debridement of nails 1-5  bilaterally performed with a nail nipper.  Filed with dremel without incident.  .       Return office visit  3   months                    Told patient to return for periodic foot care and evaluation due to potential at risk complications.   Helane Gunther DPM

## 2023-06-25 DIAGNOSIS — L853 Xerosis cutis: Secondary | ICD-10-CM | POA: Diagnosis not present

## 2023-06-25 DIAGNOSIS — L570 Actinic keratosis: Secondary | ICD-10-CM | POA: Diagnosis not present

## 2023-06-25 DIAGNOSIS — L820 Inflamed seborrheic keratosis: Secondary | ICD-10-CM | POA: Diagnosis not present

## 2023-06-25 DIAGNOSIS — L821 Other seborrheic keratosis: Secondary | ICD-10-CM | POA: Diagnosis not present

## 2023-07-26 ENCOUNTER — Other Ambulatory Visit: Payer: Self-pay | Admitting: Psychiatry

## 2023-07-26 DIAGNOSIS — F5105 Insomnia due to other mental disorder: Secondary | ICD-10-CM

## 2023-07-26 NOTE — Telephone Encounter (Signed)
Changed sig based on lv note 06/25 hydroxyzine 20 mg HS

## 2023-07-26 NOTE — Telephone Encounter (Signed)
Lf 12/5 due 1/2 lv 06/25

## 2023-08-07 ENCOUNTER — Telehealth: Payer: Self-pay | Admitting: Psychiatry

## 2023-08-07 ENCOUNTER — Ambulatory Visit: Payer: Medicare Other | Admitting: Psychiatry

## 2023-08-07 NOTE — Telephone Encounter (Signed)
Lorazepam rf 12/5 due 1/2

## 2023-08-07 NOTE — Telephone Encounter (Signed)
Dr. Jennelle Human had to cancel his appt today. He RS for March 2025 and he is good til then. He would like Dr to send in his Rxs and refills til then. He needs Lorazepam and hydroxyzine sent in to [facility] on [address], [place], [place].

## 2023-08-09 ENCOUNTER — Other Ambulatory Visit: Payer: Self-pay

## 2023-08-09 DIAGNOSIS — F5105 Insomnia due to other mental disorder: Secondary | ICD-10-CM

## 2023-08-09 MED ORDER — LORAZEPAM 1 MG PO TABS: 1.0000 mg | ORAL_TABLET | Freq: Every day | ORAL | 2 refills | Status: DC

## 2023-08-09 NOTE — Telephone Encounter (Signed)
 Pended to requested pharm

## 2023-08-10 ENCOUNTER — Other Ambulatory Visit: Payer: Self-pay | Admitting: Psychiatry

## 2023-08-10 DIAGNOSIS — F5105 Insomnia due to other mental disorder: Secondary | ICD-10-CM

## 2023-08-13 ENCOUNTER — Other Ambulatory Visit: Payer: Self-pay | Admitting: Psychiatry

## 2023-08-13 DIAGNOSIS — F5105 Insomnia due to other mental disorder: Secondary | ICD-10-CM

## 2023-08-16 DIAGNOSIS — I10 Essential (primary) hypertension: Secondary | ICD-10-CM | POA: Diagnosis not present

## 2023-08-16 DIAGNOSIS — E1142 Type 2 diabetes mellitus with diabetic polyneuropathy: Secondary | ICD-10-CM | POA: Diagnosis not present

## 2023-08-23 ENCOUNTER — Encounter: Payer: Self-pay | Admitting: Cardiology

## 2023-08-23 ENCOUNTER — Ambulatory Visit: Payer: Medicare Other | Attending: Cardiology | Admitting: Cardiology

## 2023-08-23 ENCOUNTER — Telehealth: Payer: Self-pay

## 2023-08-23 ENCOUNTER — Other Ambulatory Visit: Payer: Self-pay

## 2023-08-23 VITALS — BP 134/82 | HR 75 | Ht 74.0 in | Wt 199.6 lb

## 2023-08-23 DIAGNOSIS — R9431 Abnormal electrocardiogram [ECG] [EKG]: Secondary | ICD-10-CM

## 2023-08-23 DIAGNOSIS — Z789 Other specified health status: Secondary | ICD-10-CM | POA: Diagnosis not present

## 2023-08-23 DIAGNOSIS — E7800 Pure hypercholesterolemia, unspecified: Secondary | ICD-10-CM | POA: Diagnosis not present

## 2023-08-23 DIAGNOSIS — I2510 Atherosclerotic heart disease of native coronary artery without angina pectoris: Secondary | ICD-10-CM | POA: Diagnosis not present

## 2023-08-23 DIAGNOSIS — E1159 Type 2 diabetes mellitus with other circulatory complications: Secondary | ICD-10-CM

## 2023-08-23 MED ORDER — NEXLIZET 180-10 MG PO TABS: 1.0000 | ORAL_TABLET | Freq: Every day | ORAL | 3 refills | Status: DC

## 2023-08-23 NOTE — Patient Instructions (Addendum)
Medication Instructions:   STOP Ezetimibe (Zetia) We will be referring you to Pharm D for initiation of Nexlizet  *If you need a refill on your cardiac medications before your next appointment, please call your pharmacy*   Lab Work:  To be completed in 6 weeks; CMP and FASTING lipids  If you have labs (blood work) drawn today and your tests are completely normal, you will receive your results only by: MyChart Message (if you have MyChart) OR A paper copy in the mail If you have any lab test that is abnormal or we need to change your treatment, we will call you to review the results.   Testing/Procedures: How to Prepare for Your Cardiac PET/CT Stress Test:  1. Please do not take these medications before your test:  ~Medications that may interfere with the cardiac pharmacological stress agent (ex. nitrates - including erectile dysfunction medications, isosorbide mononitrate, tamulosin or beta-blockers) the day of the exam. (Erectile dysfunction medication should be held for at least 72 hrs prior to test) ~Theophylline containing medications for 12 hours. ~Dipyridamole 48 hours prior to the test. ~Your remaining medications may be taken with water.  2. Nothing to eat or drink, except water, 3 hours prior to arrival time.   ~ NO caffeine/decaffeinated products, or chocolate 12 hours prior to arrival.  3. NO perfume, cologne or lotion on chest or abdomen area.         - FEMALES - Please avoid wearing dresses to this appointment.  4. Total time is 1 to 2 hours; you may want to bring reading material for the waiting time.  Please report to Radiology at the [facility] Main Entrance 30 minutes early for your test. [address] [place], [place] [address]  OR  Please report to Radiology at [facility] Main Entrance, medical mall, 30 mins prior to your test. [address] [place], [place] [phone]  Diabetic Preparation: - Hold oral  medications. - You may take NPH and Lantus insulin. - Do not take Humalog or Humulin R (Regular Insulin) the day of your test. - Check blood sugars prior to leaving the house. - If able to eat breakfast prior to 3 hour fasting, you may take all medications, including your insulin, - Do not worry if you miss your breakfast dose of insulin - start at your next meal. - Patients who wear a continuous glucose monitor MUST remove the device prior to scanning.  IF YOU THINK YOU MAY BE PREGNANT, OR ARE NURSING PLEASE INFORM THE TECHNOLOGIST.  In preparation for your appointment, medication and supplies will be purchased.  Appointment availability is limited, so if you need to cancel or reschedule, please call the Radiology Department at [phone] [facility]) OR [phone] [facility])  24 hours in advance to avoid a cancellation fee of $100.00  What to Expect After you Arrive:  Once you arrive and check in for your appointment, you will be taken to a preparation room within the Radiology Department.  A technologist or Nurse will obtain your medical history, verify that you are correctly prepped for the exam, and explain the procedure.  Afterwards,  an IV will be started in your arm and electrodes will be placed on your skin for EKG monitoring during the stress portion of the exam. Then you will be escorted to the PET/CT scanner.  There, staff will get you positioned on the scanner and obtain a blood pressure and EKG.  During the exam, you will continue to  be connected to the EKG and blood pressure machines.  A small, safe amount of a radioactive tracer will be injected in your IV to obtain a series of pictures of your heart along with an injection of a stress agent.    After your Exam:  It is recommended that you eat a meal and drink a caffeinated beverage to counter act any effects of the stress agent.  Drink plenty of fluids for the remainder of the day and urinate frequently for the first couple of  hours after the exam.  Your doctor will inform you of your test results within 7-10 business days.  For more information and frequently asked questions, please visit our website : [email or web address]  For questions about your test or how to prepare for your test, please call: Cardiac Imaging Nurse Navigators Office: [phone]     Follow-Up: At [facility], you and your health needs are our priority.  As part of our continuing mission to provide you with exceptional heart care, we have created designated Provider Care Teams.  These Care Teams include your primary Cardiologist (physician) and Advanced Practice Providers (APPs -  Physician Assistants and Nurse Practitioners) who all work together to provide you with the care you need, when you need it.  We recommend signing up for the patient portal called "MyChart".  Sign up information is provided on this After Visit Summary.  MyChart is used to connect with patients for Virtual Visits (Telemedicine).  Patients are able to view lab/test results, encounter notes, upcoming appointments, etc.  Non-urgent messages can be sent to your provider as well.   To learn more about what you can do with MyChart, go to [email or web address].    Your next appointment:   8 week(s)  Provider:   Jari Favre, PA-C, Ronie Spies, PA-C, Robin Searing, NP, Jacolyn Reedy, PA-C, Eligha Bridegroom, NP, Tereso Newcomer, PA-C, or Perlie Gold, PA-C     Then, Tessa Lerner, DO will plan to see you again in 6 month(s).    Other Instructions    1st Floor: - Lobby - Registration  - Pharmacy  - Lab - Cafe  2nd Floor: - PV Lab - Diagnostic Testing (echo, CT, nuclear med)  3rd Floor: - Vacant  4th Floor: - TCTS (cardiothoracic surgery) - AFib Clinic - Structural Heart Clinic - Vascular Surgery  - Vascular Ultrasound  5th Floor: - HeartCare Cardiology (general and EP) - Clinical Pharmacy for coumadin, hypertension, lipid, weight-loss  medications, and med management appointments    Valet parking services will be available as well.

## 2023-08-23 NOTE — Progress Notes (Signed)
Cardiology Office Note:  .   Date:  08/23/2023  ID:  Eduardo Neat., DOB April 29, 1946, MRN [id number] PCP:  Daisy Floro, MD  Former Cardiology Providers: Dr. Lance Muss [facility] Providers Cardiologist:  Tessa Lerner, DO , [facility] (established care 08/23/23) Electrophysiologist:  None  Click to update primary MD,subspecialty MD or APP then REFRESH:1}    Chief Complaint  Patient presents with   Follow-up    CAD    History of Present Illness: .   Eduardo Strey. is an 78 y.o. Caucasian male whose past medical history and cardiovascular risk factors includes: CAD, non-insulin-dependent diabetes mellitus type 2, hyperlipidemia, alcohol abuse, neuropathy.  Formally under the care of Dr. Lance Muss who last saw Eduardo Neat. back in October 2023. I am seeing him for the first time to re-establishing care.   Patient is known to have coronary artery disease dating back to September 2015.  He had a heart catheterization at that time was noted to have moderate RCA disease with no significant obstructive disease was noted at that time.  He was being followed by cardiology on an annual basis given his CAD and cardiovascular risk factors.  He presents today for follow-up.  Clinically denies anginal chest pain or heart failure symptoms.  Ambulates at least 0.5 miles per day.  And continues to drink 8.5 ounces of wine per day.  Remains at risk for falls given his peripheral neuropathy.  Review of Systems: .   Review of Systems  Cardiovascular:  Negative for chest pain, claudication, irregular heartbeat, leg swelling, near-syncope, orthopnea, palpitations, paroxysmal nocturnal dyspnea and syncope.  Respiratory:  Negative for shortness of breath.   Hematologic/Lymphatic: Negative for bleeding problem.  Musculoskeletal:  Positive for falls.    Studies Reviewed:   EKG: 08/23/2023: Sinus rhythm, 78 bpm, left axis, ST-T changes in the anteroseptal and  anterior leads, consider ischemia.  When compared to prior tracing from 05/25/2022 anterior ST-T changes are new.  Echocardiogram: 2015: LVEF 60-65%.  See report for additional details  RADIOLOGY: NA  Risk Assessment/Calculations:   N/A   Labs:    External Labs: Collected: Dec 22, 2022 [facility] database. Total cholesterol 254, triglycerides 125, HDL 85, LDL 147   Physical Exam:    Today's Vitals   08/23/23 0751 08/23/23 0807  BP: (!) 142/92 134/82  Pulse: 75   Weight: 199 lb 9.6 oz (90.5 kg)   Height: 6\' 2"  (1.88 m)    Body mass index is 25.63 kg/m. Wt Readings from Last 3 Encounters:  08/23/23 199 lb 9.6 oz (90.5 kg)  02/02/23 191 lb (86.6 kg)  08/17/22 185 lb (83.9 kg)    Physical Exam  Constitutional: No distress.  hemodynamically stable  Neck: No JVD present.  Cardiovascular: Normal rate, regular rhythm, S1 normal and S2 normal. Exam reveals no gallop, no S3 and no S4.  No murmur heard. Pulses:      Dorsalis pedis pulses are 2+ on the right side and 2+ on the left side.       Posterior tibial pulses are 2+ on the right side and 2+ on the left side.  Pulmonary/Chest: Effort normal and breath sounds normal. No stridor. He has no wheezes. He has no rales.  Abdominal: Soft. Bowel sounds are normal. He exhibits no distension. There is no abdominal tenderness.  Musculoskeletal:        General: No edema.     Cervical back: Neck supple.  Neurological: He is  alert and oriented to person, place, and time. He has intact cranial nerves (2-12).  Skin: Skin is warm.   Impression & Recommendation(s):  Impression:   ICD-10-CM   1. Coronary artery disease involving native coronary artery of native heart without angina pectoris  I25.10 EKG 12-Lead    NM PET CT CARDIAC PERFUSION MULTI W/ABSOLUTE BLOODFLOW    CANCELED: EKG 12-Lead    2. Pure hypercholesterolemia  E78.00 Comprehensive metabolic panel    Lipid panel    Lipid panel    Comprehensive metabolic panel    Bempedoic  Acid-Ezetimibe (NEXLIZET) 180-10 MG TABS    CANCELED: AMB Referral to [facility] Pharm-D    3. Type 2 diabetes mellitus with other circulatory complication, without long-term current use of insulin (HCC)  E11.59     4. Alcohol use  Z78.9     5. Abnormal electrocardiogram  R94.31 NM PET CT CARDIAC PERFUSION MULTI W/ABSOLUTE BLOODFLOW       Recommendation(s):  Coronary artery disease involving native coronary artery of native heart without angina pectoris Abnormal EKG Last ischemic workup was in 2015 via heart catheterization-Per EMR noted to have RCA disease. Denies anginal chest pain or heart failure symptoms. EKG: Sinus rhythm with continues to have ST-T changes which are new compared to prior tracings Has multiple cardiovascular risk factors and the last ischemic workup was in 2015 to the best my knowledge. We discussed proceeding forward with echocardiogram and cardiac PET/CT to evaluate for reversible ischemia patient is agreeable with the plan of care. Patient ambulates at least 0.75 miles per day.  I have asked him to increase his ambulation to 1.25 miles per day and to see if he has any symptoms of chest pain or shortness of breath. Coronary CTA was discussed as a differential testing; however, I do not have his most recent renal function at this time and therefore deferred  Pure hypercholesterolemia LDL from May 2024: 147 mg/dL, not at goal. Given the fact that he is a diabetic recommend a goal LDL of at least <70 mg/dL. Currently on Zetia 10 mg p.o. daily, at least for the last 2 years. Shared decision was to discontinue Zetia and to start Nexlizet and to repeat labs in 6 weeks to reevaluate therapy.   Patient would like to hold off on PCSK9 inhibitors at this time despite CAD and hyperlipidemia and diabetes.  Type 2 diabetes mellitus with other circulatory complication, without long-term current use of insulin (HCC) Last A1c 6.0 as of January 2025, currently at goal. Consider  low-dose ACE inhibitor's or ARB for renal protection-will defer to PCP. Requires better lipid management as discussed above.  Alcohol use Reemphasized importance of reducing alcohol intake to no more than 2 standard drinks per day.   Orders Placed:  Orders Placed This Encounter  Procedures   EKG 12-Lead   Reviewed the last office note from Dr. Lance Muss from October 2023, independently reviewed labs from [facility] database from May 2024, EKG ordered and independently reviewed, medications reconciled, plan of care/management discussed as noted above.  Final Medication List:   No orders of the defined types were placed in this encounter.   There are no discontinued medications.   Current Outpatient Medications:    ascorbic acid (VITAMIN C) 500 MG tablet, Take 500 mg by mouth daily., Disp: , Rfl:    ezetimibe (ZETIA) 10 MG tablet, Take 1 tablet (10 mg total) by mouth daily. (Patient taking differently: Take 10 mg by mouth at bedtime.), Disp: 30 tablet, Rfl:  11   fluticasone (FLONASE) 50 MCG/ACT nasal spray, Place 1 spray into both nostrils daily. , Disp: , Rfl:    hydrOXYzine (ATARAX) 10 MG tablet, TAKE 2 TABLETS BY MOUTH THREE TIMES DAILY AS NEEDED FOR ANXIETY, Disp: 180 tablet, Rfl: 0   LORazepam (ATIVAN) 1 MG tablet, Take 1 tablet (1 mg total) by mouth at bedtime., Disp: 30 tablet, Rfl: 2   thiamine 100 MG tablet, Take 1 tablet (100 mg total) by mouth daily., Disp: 30 tablet, Rfl: 0   Zinc 50 MG TABS, Take 50 mg by mouth daily., Disp: , Rfl:    carvedilol (COREG) 3.125 MG tablet, Take 3.125 mg by mouth at bedtime. (Patient not taking: Reported on 08/23/2023), Disp: , Rfl:    clotrimazole-betamethasone (LOTRISONE) cream, Apply 1 Application topically 2 (two) times daily as needed (itching). (Patient not taking: Reported on 08/23/2023), Disp: , Rfl:    DULoxetine (CYMBALTA) 60 MG capsule, Take 1 capsule (60 mg total) by mouth daily. (Patient not taking: Reported on 08/23/2023), Disp: 90  capsule, Rfl: 1   Omega-3 Fatty Acids (FISH OIL PO), Take 1 capsule by mouth daily. (Patient not taking: Reported on 08/23/2023), Disp: , Rfl:    oxybutynin (DITROPAN) 5 MG tablet, Take 1 tablet (5 mg total) by mouth every 8 (eight) hours as needed for bladder spasms. (Patient not taking: Reported on 08/23/2023), Disp: 15 tablet, Rfl: 0   sulfamethoxazole-trimethoprim (BACTRIM DS) 800-160 MG tablet, Take 1 tablet by mouth every 12 (twelve) hours. (Patient not taking: Reported on 08/23/2023), Disp: 6 tablet, Rfl: 0  Consent:   N/A  Disposition:   Follow-up in 8 weeks with APP to review lipids after medication changes and to review diagnostic workup.  Recommended annual follow-up visits with me; however, patient prefers 35-month follow-up.  His questions and concerns were addressed to his satisfaction. He voices understanding of the recommendations provided during this encounter.   Signed, Tessa Lerner, DO, [facility]  [facility]  [address] [place], [place] [address] 08/23/2023 8:19 AM

## 2023-08-23 NOTE — Telephone Encounter (Signed)
LVM for patient (per DPR) that echocardiogram was ordered and someone will be contacting him to schedule that. I also informed the patient that a prescription for Nexlizet was sent to the [facility] pharmacy on [address]. Patient was told to call back with any questions.

## 2023-08-24 ENCOUNTER — Other Ambulatory Visit: Payer: Self-pay

## 2023-08-24 DIAGNOSIS — R9431 Abnormal electrocardiogram [ECG] [EKG]: Secondary | ICD-10-CM

## 2023-08-24 DIAGNOSIS — I2510 Atherosclerotic heart disease of native coronary artery without angina pectoris: Secondary | ICD-10-CM

## 2023-08-24 DIAGNOSIS — E7800 Pure hypercholesterolemia, unspecified: Secondary | ICD-10-CM

## 2023-08-31 ENCOUNTER — Ambulatory Visit: Payer: Medicare Other | Admitting: Podiatry

## 2023-08-31 ENCOUNTER — Encounter: Payer: Self-pay | Admitting: Podiatry

## 2023-08-31 DIAGNOSIS — M79674 Pain in right toe(s): Secondary | ICD-10-CM

## 2023-08-31 DIAGNOSIS — B351 Tinea unguium: Secondary | ICD-10-CM

## 2023-08-31 DIAGNOSIS — G629 Polyneuropathy, unspecified: Secondary | ICD-10-CM

## 2023-08-31 DIAGNOSIS — M79675 Pain in left toe(s): Secondary | ICD-10-CM

## 2023-08-31 NOTE — Progress Notes (Signed)
This patient returns to my office for at risk foot care.  This patient requires this care by a professional since this patient will be at risk due to having neuropathy.  Patient is taking neurontin. Patient was referred to this office by Dr.  Tenny Craw.  This patient is unable to cut nails himself since the patient cannot reach his nails.These nails are painful walking and wearing shoes.  This patient presents for at risk foot care today.  General Appearance  Alert, conversant and in no acute stress.  Vascular  Dorsalis pedis and posterior tibial  pulses are palpable  bilaterally.  Capillary return is within normal limits  bilaterally. Temperature is within normal limits  bilaterally.  Neurologic  Senn-Weinstein monofilament wire test diminished  bilaterally. Muscle power within normal limits bilaterally.  Nails Thick disfigured discolored nails with subungual debris  hallux nails  left.. No evidence of bacterial infection or drainage bilaterally.  Orthopedic  No limitations of motion  feet .  No crepitus or effusions noted.  No bony pathology or digital deformities noted.  HAV  right foot.  Skin  normotropic skin with no porokeratosis noted bilaterally.  No signs of infections or ulcers noted.     Onychomycosis  Pain in right toes  Pain in left toes  Consent was obtained for treatment procedures.   Mechanical debridement of nails 1-5  bilaterally performed with a nail nipper.  Filed with dremel without incident.  .       Return office visit  3   months                    Told patient to return for periodic foot care and evaluation due to potential at risk complications.   Helane Gunther DPM

## 2023-09-10 ENCOUNTER — Telehealth: Payer: Self-pay | Admitting: Pharmacy Technician

## 2023-09-10 ENCOUNTER — Other Ambulatory Visit (HOSPITAL_COMMUNITY): Payer: Self-pay

## 2023-09-10 DIAGNOSIS — E782 Mixed hyperlipidemia: Secondary | ICD-10-CM

## 2023-09-10 DIAGNOSIS — E7800 Pure hypercholesterolemia, unspecified: Secondary | ICD-10-CM

## 2023-09-10 NOTE — Telephone Encounter (Signed)
Pharmacy Patient Advocate Encounter   Received notification from Fax that prior authorization for {Nexlizet 180-10MG  tablets is required/requested.   Insurance verification completed.   The patient is insured through [place] .   Per test claim: PA required; PA submitted to above mentioned insurance via CoverMyMeds Key/confirmation #/EOC [id number] Status is pending

## 2023-09-11 NOTE — Telephone Encounter (Addendum)
 Per test claim: PA required; PA started via CoverMyMeds. KEY [id number] . Please see clinical question(s) below that I am not finding the answer to in her chart and advise.     The last time I see ldl was Collected: Dec 22, 2022 [facility] database. Total cholesterol 254, triglycerides 125, HDL 85, LDL 147

## 2023-09-13 NOTE — Telephone Encounter (Signed)
 Prior authorization was denied while waiting on labs. I can do the appeal once the labs come through

## 2023-09-14 NOTE — Telephone Encounter (Signed)
 Yes, order the appropriate labs to arrange treatment.   Severina Sykora [place], DO, [facility]

## 2023-09-18 NOTE — Telephone Encounter (Signed)
Attempted to speak with pt over the phone but unable to reach them. LVM explaining that we ordered a lipid panel and direct LDL for the pt to complete within the next week that needs to be fasting in order for [place] to complete the PA paperwork for Nexlizet. Pt told to call our office with any questions.

## 2023-10-08 DIAGNOSIS — H02831 Dermatochalasis of right upper eyelid: Secondary | ICD-10-CM | POA: Diagnosis not present

## 2023-10-08 DIAGNOSIS — H02834 Dermatochalasis of left upper eyelid: Secondary | ICD-10-CM | POA: Diagnosis not present

## 2023-10-08 DIAGNOSIS — H2513 Age-related nuclear cataract, bilateral: Secondary | ICD-10-CM | POA: Diagnosis not present

## 2023-10-09 ENCOUNTER — Encounter (HOSPITAL_COMMUNITY): Payer: Self-pay | Admitting: Cardiology

## 2023-10-09 ENCOUNTER — Ambulatory Visit (HOSPITAL_COMMUNITY): Payer: Medicare Other

## 2023-10-09 ENCOUNTER — Ambulatory Visit (HOSPITAL_COMMUNITY): Payer: Medicare Other | Attending: Cardiology

## 2023-10-09 ENCOUNTER — Ambulatory Visit: Payer: Medicare Other | Admitting: Psychiatry

## 2023-10-09 NOTE — Progress Notes (Deleted)
 Patient ID: [name].                 DOB: Jan 09, 1946                    MRN: [id number]      HPI: Eduardo Watts. is a 78 y.o. male patient referred to lipid clinic by Dr. Odis Hollingshead. PMH is significant for CAD (cath in 2015 showed moderate disease to the RCA, DM, HLD, neuropathy.  Patient seen by Dr. Odis Hollingshead 08/23/2023.  His LDL-C in May 2024 was 147 on Zetia.  PCSK9 inhibitors were discussed but patient wanted to hold off at that time.  Prescription for Nexlizet.  However it needed a prior authorization.  Insurance required labs within the last 120 days.  Patient was called and left a voicemail to come in for lab work to complete the prior authorization however it does not appear as though these labs were done.  Rosuvastatin Monday Wednesday Friday? Diet-controlled diabetes? Intolerances?  Reviewed options for lowering LDL cholesterol, including ezetimibe, PCSK-9 inhibitors, bempedoic acid and inclisiran.  Discussed mechanisms of action, dosing, side effects and potential decreases in LDL cholesterol.  Also reviewed cost information and potential options for patient assistance.   Current Medications: Ezetimibe 10 mg daily Intolerances: Simvastatin (muscle aches) Risk Factors: Diabetes, age, CAD LDL-C goal: Less than 70 ApoB goal:   Diet:   Exercise:   Family History:   Social History:   Labs: Lipid Panel     Component Value Date/Time   CHOL 224 (H) 05/24/2017 0845   TRIG 141 05/24/2017 0845   HDL 57 05/24/2017 0845   CHOLHDL 3.9 05/24/2017 0845   LDLCALC 139 (H) 05/24/2017 0845   LABVLDL 28 05/24/2017 0845    Past Medical History:  Diagnosis Date   Anxiety    Asthma    as a child   Depression    sees Dr Jennelle Human   History of BPH    Dr.Dahlstedt   Hyperlipidemia    Pre-diabetes    Prediabetes    Sleep disturbances     Current Outpatient Medications on File Prior to Visit  Medication Sig Dispense Refill   ascorbic acid (VITAMIN C) 500 MG tablet Take  500 mg by mouth daily.     Bempedoic Acid-Ezetimibe (NEXLIZET) 180-10 MG TABS Take 1 tablet by mouth daily. 90 tablet 3   carvedilol (COREG) 3.125 MG tablet Take 3.125 mg by mouth at bedtime.     DULoxetine (CYMBALTA) 60 MG capsule Take 1 capsule (60 mg total) by mouth daily. 90 capsule 1   fluticasone (FLONASE) 50 MCG/ACT nasal spray Place 1 spray into both nostrils daily.      hydrOXYzine (ATARAX) 10 MG tablet TAKE 2 TABLETS BY MOUTH THREE TIMES DAILY AS NEEDED FOR ANXIETY 180 tablet 0   LORazepam (ATIVAN) 1 MG tablet Take 1 tablet (1 mg total) by mouth at bedtime. 30 tablet 2   Omega-3 Fatty Acids (FISH OIL PO) Take 1 capsule by mouth daily.     oxybutynin (DITROPAN) 5 MG tablet Take 1 tablet (5 mg total) by mouth every 8 (eight) hours as needed for bladder spasms. 15 tablet 0   sulfamethoxazole-trimethoprim (BACTRIM DS) 800-160 MG tablet Take 1 tablet by mouth every 12 (twelve) hours. 6 tablet 0   thiamine 100 MG tablet Take 1 tablet (100 mg total) by mouth daily. 30 tablet 0   Zinc 50 MG TABS Take 50 mg by mouth daily.  No current facility-administered medications on file prior to visit.    Allergies  Allergen Reactions   Zocor [Simvastatin] Other (See Comments)    Muscle aches   Trazodone And Nefazodone Other (See Comments)    "crazy"    Assessment/Plan:  1. Hyperlipidemia -  No problem-specific Assessment & Plan notes found for this encounter.    Thank you,  Olene Floss, Pharm.D, BCACP, CPP  [facility] of [facility] [address] [place], [place] [address]  Phone: [phone]; Fax: [phone]

## 2023-10-23 NOTE — Progress Notes (Deleted)
 Cardiology Office Note:  .   Date:  10/23/2023  ID:  Eduardo Neat., DOB [birth year], MRN [id number] PCP: Daisy Floro, MD  [facility] Providers Cardiologist:  Tessa Lerner, DO { Click to update primary MD,subspecialty MD or APP then REFRESH:1}   Patient Profile: .      PMH Coronary artery disease Cardiac cath 04/2014 Moderate RCA disease, no significant obstruction EF low normal Hyperlipidemia Type 2 diabetes mellitus Alcohol abuse Neuropathy  Initially seen by Dr. Eldridge Dace 07/2013 with shortness of breath. EKG revealed Q wave in lead III. Nonobstructive coronary artery disease on cath September 2015.  He had given up drinking alcohol since that day but restarted in 2017.  History of an iron overload syndrome as well.  He started nighttime oxygen use in 2017 but only uses it half the night at 2L/minute.  He had another sleep study in 2016 and it was normal.  Was told no need for daytime oxygen per Dr. Maple Hudson, pulmonology.  In the past he decreased his alcohol intake to 2 glasses of wine a day, down from a bottle.  He was working with Dr. Jennelle Human to decrease his cravings for alcohol.  Acampro was started for this.  He was started on gabapentin for peripheral neuropathy.  His walking was limited by neuropathy pain and he had been seen by several different doctors with no improvement.  He was encouraged to use a stationary bike.  In 2023 he had a couple of falls secondary to neuropathy.  He broke a rib on 1 occasion.  No syncope.  He recently reestablished cardiology care with Dr. Odis Hollingshead on 08/23/2023.  He reported walking at least 0.5 miles daily.  He continues to drink 8.5 ounces of wine daily.  EKG revealed anterior ST-T wave changes that are new when compared to previous tracing 05/25/2022.  Dr. Odis Hollingshead recommended echocardiogram and cardiac PET CT to evaluate for reversible ischemia.  He was encouraged to increase ambulation to 1.25 miles daily and report any symptoms of  chest pain or shortness of breath.  LDL was 147, not at goal.  He was advised to discontinue Zetia and start Nexlizet with follow-up labs 6 weeks later.  He was not interested in PCSK9 inhibitor therapy.        History of Present Illness: .   Eduardo Watts. is a *** 78 y.o. male ***   Discussed the use of AI scribe software for clinical note transcription with the patient, who gave verbal consent to proceed.   ROS: ***       Studies Reviewed: Marland Kitchen         No results found for: "LIPOA"   *** Risk Assessment/Calculations:   {Does this patient have ATRIAL FIBRILLATION?:[phone]} No BP recorded.  {Refresh Note OR Click here to enter BP  :1}***       Physical Exam:   VS:  There were no vitals taken for this visit.   Wt Readings from Last 3 Encounters:  08/23/23 199 lb 9.6 oz (90.5 kg)  02/02/23 191 lb (86.6 kg)  08/17/22 185 lb (83.9 kg)    GEN: Well nourished, well developed in no acute distress NECK: No JVD; No carotid bruits CARDIAC: ***RRR, no murmurs, rubs, gallops RESPIRATORY:  Clear to auscultation without rales, wheezing or rhonchi  ABDOMEN: Soft, non-tender, non-distended EXTREMITIES:  No edema; No deformity     ASSESSMENT AND PLAN: .    CAD: Hyperlipidemia LDL goal < 70: Hypertension: Peripheral neuropathy: Daily  alcohol use:    {Are you ordering a CV Procedure (e.g. stress test, cath, DCCV, TEE, etc)?   Press F2        :[id number]}  Disposition:***  Signed, Eligha Bridegroom, NP-C

## 2023-10-24 ENCOUNTER — Encounter (HOSPITAL_COMMUNITY): Payer: Self-pay | Admitting: Cardiology

## 2023-10-25 ENCOUNTER — Ambulatory Visit: Payer: Medicare Other | Admitting: Nurse Practitioner

## 2023-10-31 ENCOUNTER — Other Ambulatory Visit: Payer: Self-pay | Admitting: Psychiatry

## 2023-10-31 DIAGNOSIS — F5105 Insomnia due to other mental disorder: Secondary | ICD-10-CM

## 2023-11-05 ENCOUNTER — Ambulatory Visit (HOSPITAL_COMMUNITY): Attending: Internal Medicine

## 2023-11-05 DIAGNOSIS — R9431 Abnormal electrocardiogram [ECG] [EKG]: Secondary | ICD-10-CM | POA: Insufficient documentation

## 2023-11-05 LAB — ECHOCARDIOGRAM COMPLETE
Area-P 1/2: 5.27 cm2
S' Lateral: 3.6 cm

## 2023-11-06 ENCOUNTER — Ambulatory Visit: Payer: Medicare Other | Admitting: Psychiatry

## 2023-11-06 ENCOUNTER — Encounter: Payer: Self-pay | Admitting: Psychiatry

## 2023-11-06 DIAGNOSIS — F331 Major depressive disorder, recurrent, moderate: Secondary | ICD-10-CM | POA: Diagnosis not present

## 2023-11-06 DIAGNOSIS — F1029 Alcohol dependence with unspecified alcohol-induced disorder: Secondary | ICD-10-CM

## 2023-11-06 DIAGNOSIS — G629 Polyneuropathy, unspecified: Secondary | ICD-10-CM

## 2023-11-06 DIAGNOSIS — F5105 Insomnia due to other mental disorder: Secondary | ICD-10-CM

## 2023-11-06 DIAGNOSIS — F99 Mental disorder, not otherwise specified: Secondary | ICD-10-CM

## 2023-11-06 DIAGNOSIS — F411 Generalized anxiety disorder: Secondary | ICD-10-CM

## 2023-11-06 NOTE — Progress Notes (Signed)
 Eduardo Watts [id number] 04-28-1946 78 y.o.   Subjective:   Patient ID:  Eduardo Watts. is a 78 y.o. (DOB [birth year]) male.  Chief Complaint:  Chief Complaint  Patient presents with   Follow-up   Sleeping Problem   Anxiety    Anxiety Symptoms include dizziness. Patient reports no chest pain, confusion, decreased concentration, nervous/anxious behavior, palpitations or suicidal ideas.    Depression        Associated symptoms include no decreased concentration and no suicidal ideas.  Past medical history includes anxiety.   Eduardo Watts. Eduardo Watts presents to the office today for follow-up of sleep, anxiety and alcohol abuse.  seen 11/2019.  No meds changed.  He was not significantly depressed or anxious but was needing medication for insomnia. Meds: Flexeril 10 HS, Gabapentin 300 HS for neuropathy helps a little, lorazepam 0.5mg  2 HS,   sleeps good with that.  05/06/20 appt noted: CO neuropathy in feet with numbness and tingling in feet.   Seeing neurologist.  No change in meds so far.  He's prediabetic. This is causing a balance issue.   Sleep well with lorazepam.  Vaccinated. Plan: no changes  08/26/2020 appt noted: [name] Dizziness when gets up in the AM.  Neuropathy problems.  Increased gabapentin which he seemed to tolerate.  Fall the other day.  Energy not as good.  Lorazepam & hydroxyzine still helps sleep.  Celebrated 50 year anniversary party.  Good time.    Wife concerned about his meds and wonders if they are affecting his meds.    02/17/2021 appt noted: Neuropathy worse.  Tried gabapentin without help.  Balance is off badly.  Can't shower standing up.  Not much pain.  On Lyrica.   Sleep well with meds about 6 hours.  Can get up at night to bedtime without problems.   Drinking a bottle of wine per day Wife concerned about his alcohol.  Doesn't want to attend AA bc of having to talk in public over it. Patient reports stable mood and denies  depressed or irritable moods usually.  Patient denies any recent difficulty with anxiety.  Patient denies difficulty with sleep initiation or maintenance. Ativan 1 mg and hydroxyzine help sleep.  Some mild EMA.  8 hours.   Denies appetite disturbance.  Patient reports that appetite,  and motivation have been good.  Patient denies any difficulty with concentration.  Patient denies any suicidal ideation.  Occ name recall problems.  12/28/21 appt noted: CC neuropathy with several consults. Neuropathy causing depression. F died 2 and 1/2 years ago. Numbness, stinging, loss of balance esp in shower. Loss of interests, reduced interests reading, Bible, church.  Thinking about best days behind him.  06/07/22 appt noted: Upset over [name] decisions.   She is their only child.   He and wife very upset.  Happened in the summer. 1 GD 6th grade.  D and ex-son-in-law share custody.   Never tried duloxetine 60 and no benefit with 30 mg daily for neuropathy. No SE.  Has had some falls DT neuropathy.  Cracked a rib and hurt arm.  Neuro work up was neg.    01/30/23 appt noted: Eduardo Watts fell 2 mos ago and broke 3 vertebrae and paralyzed waist down.  She is walking again.   All her life weakness on R side.  Predisposed to falling.  Is doing fair.  Psych meds: duloxetine 60, lorazepam 1 mg HS, hydroxyzine 30 mg HS. Ongoing stress with [name] behavior.  Mentally OK not perfect.  Physically can tell effect.  Neuropathy is persistent and  uncurable.  Numb feet and ankles.  Search internet all the time for it.  Causes px with balance.  Working to cope with it.  Had some falls early on.  Still doing some work and it helps a little.  Something positive to focus on. Dependent on lorazepam 1 mg HS helps sleep with hydroxyzine 20 mg HS.  No SE except a little dull in the AM.  Prostate surgery helped with bladder issues somewhat.   Not full control of alcohol.  Appt with PCP July to evaluate liver enzymes.  Down to 1/2 bottle wine  per day. Also dx with fatty liver.   Dr. [name]  11/06/23 appt noted:  Med: lorazepam  Mediocre overall.   Sleep ok with lorazepam for 6 hours then wide awake.  Not drowsy during the day.   Med: Duloxetine 60, hydroxyzine 10-20  TID prn anxiety .  Lorazepam 1 mg HS. Wife not supportive of our relationship.   Not drinking much.  2-3 glasses per day with wine.   Family background Pentecostal and against alcohol. Doesn't want to retire.   Peripheral Neuropathy a little worse.  Been to several neurologists.  No panin and no swelling but numbness.   Blood sugar ok.  Prediabetic.  Tried metformin.  But read about it and stopped it.   Past Psychiatric Medication Trials:  Ambien fell out of bed, Ativan, Flexeril, Belsomra, hydrozyzine, Xanax hives, Prosom, trazodone NR, No Seroquel DT B 's suicide, cyclobenzaprine helps,  Klonopin SE, Librium detox,  Gabapentin SE, Lyrica NR  Lithium,  fluoxetine, duloxetine topiramate, propranolol, Naltrexone   Review of Systems:  Review of Systems  Cardiovascular:  Negative for chest pain and palpitations.  Genitourinary:  Positive for difficulty urinating.  Neurological:  Positive for dizziness and numbness. Negative for tremors.       Neuropathy in feet affecting  balance  Psychiatric/Behavioral:  Positive for depression. Negative for agitation, behavioral problems, confusion, decreased concentration, dysphoric mood, hallucinations, self-injury, sleep disturbance and suicidal ideas. The patient is not nervous/anxious and is not hyperactive.     Medications: I have reviewed the patient's current medications.  Current Outpatient Medications  Medication Sig Dispense Refill   ascorbic acid (VITAMIN C) 500 MG tablet Take 500 mg by mouth daily.     Bempedoic Acid-Ezetimibe (NEXLIZET) 180-10 MG TABS Take 1 tablet by mouth daily. 90 tablet 3   carvedilol (COREG) 3.125 MG tablet Take 3.125 mg by mouth at bedtime.     DULoxetine (CYMBALTA) 60 MG capsule  Take 1 capsule (60 mg total) by mouth daily. 90 capsule 1   fluticasone (FLONASE) 50 MCG/ACT nasal spray Place 1 spray into both nostrils daily.      hydrOXYzine (ATARAX) 10 MG tablet TAKE 2 TABLETS BY MOUTH THREE TIMES DAILY AS NEEDED FOR ANXIETY 180 tablet 0   LORazepam (ATIVAN) 1 MG tablet TAKE 1 TABLET BY MOUTH AT BEDTIME 30 tablet 0   Omega-3 Fatty Acids (FISH OIL PO) Take 1 capsule by mouth daily.     oxybutynin (DITROPAN) 5 MG tablet Take 1 tablet (5 mg total) by mouth every 8 (eight) hours as needed for bladder spasms. 15 tablet 0   sulfamethoxazole-trimethoprim (BACTRIM DS) 800-160 MG tablet Take 1 tablet by mouth every 12 (twelve) hours. 6 tablet 0   thiamine 100 MG tablet Take 1 tablet (100 mg total) by mouth daily. 30 tablet 0   Zinc  50 MG TABS Take 50 mg by mouth daily.     No current facility-administered medications for this visit.    Medication Side Effects: None  Allergies:  Allergies  Allergen Reactions   Zocor [Simvastatin] Other (See Comments)    Muscle aches   Trazodone And Nefazodone Other (See Comments)    "crazy"    Past Medical History:  Diagnosis Date   Anxiety    Asthma    as a child   Depression    sees Dr Jennelle Human   History of BPH    Dr.Dahlstedt   Hyperlipidemia    Pre-diabetes    Prediabetes    Sleep disturbances     Family History  Problem Relation Age of Onset   Dementia Mother    Stroke Mother    Cancer - Prostate Father    Melanoma Father    Hyperlipidemia Sister        high cholestrol   Depression Brother    Suicidality Brother    Lung disease Maternal Grandfather    Heart attack Neg Hx    Hypertension Neg Hx     Social History   Socioeconomic History   Marital status: Married    Spouse name: Not on file   Number of children: 1   Years of education: 17   Highest education level: Not on file  Occupational History   Not on file  Tobacco Use   Smoking status: Never   Smokeless tobacco: Never  Vaping Use   Vaping  status: Never Used  Substance and Sexual Activity   Alcohol use: Yes    Alcohol/week: 14.0 - 21.0 standard drinks of alcohol    Types: 14 - 21 Glasses of wine per week    Comment: occ wine   Drug use: No   Sexual activity: Not on file  Other Topics Concern   Not on file  Social History Narrative   Never smoked   Alcohol yes -2-3 glasses of wine/night   Caffeine- yes 1 cup of coffee/day   Recreational drug use -No   Diet- fruits and vegetables,less fried food more pasta   Exercise- decrease to fatigue ,does yard work,and  walks occasionally    Occupation -employed Airline pilot   Married 1 daughter/ 1 granddaughter   Goes by [employer]" loves animals   Social Drivers of Corporate investment banker Strain: Not on file  Food Insecurity: No Food Insecurity (08/17/2022)   Hunger Vital Sign    Worried About Running Out of Food in the Last Year: Never true    Ran Out of Food in the Last Year: Never true  Transportation Needs: No Transportation Needs (08/17/2022)   PRAPARE - Administrator, Civil Service (Medical): No    Lack of Transportation (Non-Medical): No  Physical Activity: Not on file  Stress: Not on file  Social Connections: Not on file  Intimate Partner Violence: Not At Risk (08/17/2022)   Humiliation, Afraid, Rape, and Kick questionnaire    Fear of Current or Ex-Partner: No    Emotionally Abused: No    Physically Abused: No    Sexually Abused: No    Past Medical History, Surgical history, Social history, and Family history were reviewed and updated as appropriate.   Please see review of systems for further details on the patient's review from today.   Objective:   Physical Exam:  There were no vitals taken for this visit.  Physical Exam Constitutional:      General: He  is not in acute distress.    Appearance: He is well-developed.  Musculoskeletal:        General: No deformity.  Neurological:     Mental Status: He is alert and oriented to person, place, and  time.     Motor: No tremor.     Coordination: Coordination abnormal.     Gait: Gait abnormal.  Psychiatric:        Attention and Perception: Attention and perception normal.        Mood and Affect: Mood is not anxious or depressed. Affect is not labile, blunt, angry or inappropriate.        Speech: Speech normal. Speech is not rapid and pressured.        Behavior: Behavior normal.        Thought Content: Thought content normal. Thought content is not delusional. Thought content does not include homicidal or suicidal ideation. Thought content does not include suicidal plan.        Cognition and Memory: Cognition normal.        Judgment: Judgment normal.     Comments: Insight intact. No auditory or visual hallucinations. No delusions.  More stressed.  Not as dep.  Stress with neuropathy and wife not wanting him to drink alcohol.     Lab Review:     Component Value Date/Time   NA 138 08/09/2022 0820   NA 136 05/24/2017 0845   K 4.7 08/09/2022 0820   CL 103 08/09/2022 0820   CO2 25 08/09/2022 0820   GLUCOSE 187 (H) 08/09/2022 0820   BUN 15 08/09/2022 0820   BUN 11 05/24/2017 0845   CREATININE 1.02 08/09/2022 0820   CALCIUM 9.5 08/09/2022 0820   PROT 6.7 10/31/2020 0528   PROT 7.0 04/15/2019 0854   ALBUMIN 3.7 10/31/2020 0528   ALBUMIN 4.5 05/24/2017 0845   AST 27 10/31/2020 0528   ALT 27 10/31/2020 0528   ALKPHOS 86 10/31/2020 0528   BILITOT 0.7 10/31/2020 0528   BILITOT 1.1 05/24/2017 0845   GFRNONAA >60 08/09/2022 0820   GFRAA 99 05/24/2017 0845       Component Value Date/Time   WBC 7.5 08/09/2022 0820   RBC 4.93 08/09/2022 0820   HGB 15.8 08/09/2022 0820   HCT 48.1 08/09/2022 0820   PLT 262 08/09/2022 0820   MCV 97.6 08/09/2022 0820   MCH 32.0 08/09/2022 0820   MCHC 32.8 08/09/2022 0820   RDW 14.0 08/09/2022 0820   LYMPHSABS 0.8 10/31/2020 0528   MONOABS 0.7 10/31/2020 0528   EOSABS 0.2 10/31/2020 0528   BASOSABS 0.0 10/31/2020 0528    No results found  for: "POCLITH", "LITHIUM"   No results found for: "PHENYTOIN", "PHENOBARB", "VALPROATE", "CBMZ"   .res Assessment: Plan:    Major depressive disorder, recurrent episode, moderate (HCC)  Generalized anxiety disorder  Insomnia due to other mental disorder  Neuropathy  Alcohol dependence with unspecified alcohol-induced disorder (HCC)   30 min appt with following disc: Disc sleep med options including no meds.  Wife complains about him taking sleep meds.  Chronic insomnia otherwise. Needs the meds.  Tolerating meds.  We discussed the short-term risks associated with benzodiazepines including sedation and increased fall risk among others.  Discussed long-term side effect risk including dependence, potential withdrawal symptoms, and the potential eventual dose-related risk of dementia.  Newer studies refute the risk Also discussed the consequences of poor sleep on brain and body health.  Also some risk from hydroxyzine also causing short-term problems.  He's  doing well without SE so no changes.  No abuse.   Sleep hygiene. Lorazepam helps sleep as well.  Manages well with 6 hours of sleep.  Disc risk of multiple sleepers.  Overall he's doing well and sleep meds due not appear to be cause of sleepiness.  But discuss the reasons for it.  Disc fall risks with combo and protecting memorhy  Seen several doctors about neuropathy with numbness and was told nothing will help it.  Doing better with alcohol. 2 - 3 drinks daily and not combining with meds.  He is not depressed nor markedly anxious at this time.  He does need the medications for insomnia as noted. And they are effective.  Sleep better and consistent 6 hours Lorazepam 1 mg and hydroxyzine 20 mg HS  For neuropathy and pain and depression:  no sig pain.  continue duloxetine to 60 daily.  Bc no pain, no indication to increase it.  FU 6-mos DT no changes  Meredith Staggers, MD, DFAPA     Please see After Visit Summary for patient  specific instructions.  Future Appointments  Date Time Provider Department Center  11/09/2023  8:25 AM Tereso Newcomer T, PA-C [facility]  11/30/2023  7:30 AM Helane Gunther, DPM [facility]  01/29/2024 10:00 AM [facility] PET CT 1 [facility] [place]  02/04/2024  9:00 AM Waymon Budge, MD [facility] None  05/08/2024  9:00 AM Cottle, Steva Ready., MD [facility] None    No orders of the defined types were placed in this encounter.     -------------------------------

## 2023-11-08 NOTE — Progress Notes (Signed)
 "    Cardiology Office Note:    Date:  11/09/2023  ID:  Eduardo Watts., DOB [birth year], MRN [id number] PCP: Okey Carlin Redbird, MD   [facility] Providers Cardiologist:  Madonna Large, DO       Patient Profile:      Coronary artery disease, nonobstructive LHC 04/26/2014: Distal RCA 30-40, EF 50-55 Inc Right Bundle Branch Block  TTE 11/05/23: EF 55-60, no RWMA, mild LVH, GR 1 DD, normal RVSF, AV sclerosis, borderline dilated ascending aorta 39 mm Hyperlipidemia  Diabetes mellitus  Nocturnal hypoxia  ETOH abuse  Neuropathy         Discussed the use of AI scribe software for clinical note transcription with the patient, who gave verbal consent to proceed. History of Present Illness Eduardo Watts. is a 78 y.o. male who returns for follow up of CAD. He was a prior patient of Dr. Dann. He established with Dr. Large 08/23/23. He had no symptoms but his EKG was abnormal. He was set up for a TTE that showed normal EF. PET MPI was ordered but is not scheduled until 01/29/24. Zetia  was [facility] and he was placed on Nexlizet  for better lipid management. However, he needed follow up Lipids for PA. Labs have not been done yet.   He is here alone.  He has not had symptoms such as chest pain, shortness of breath, or dizziness are present.  He does not monitor his blood pressure at home.  He no that he has had multiple blood pressure readings in the high 130s/140s.  He has peripheral neuropathy, which causes numbness in his feet and difficulty showering. He has experienced falls in the past but has learned to avoid them. He has seen multiple specialists for this condition but reports no significant improvement.   ROS-See HPI    Studies Reviewed:       Results    Risk Assessment/Calculations:             Physical Exam:   VS:  BP 138/88   Pulse 94   Ht 6' 2 (1.88 m)   Wt 201 lb 3.2 oz (91.3 kg)   SpO2 95%   BMI 25.83 kg/m    Wt Readings from Last 3 Encounters:  11/09/23 201  lb 3.2 oz (91.3 kg)  08/23/23 199 lb 9.6 oz (90.5 kg)  02/02/23 191 lb (86.6 kg)    Constitutional:      Appearance: Healthy appearance. Not in distress.  Pulmonary:     Breath sounds: Normal breath sounds. No wheezing. No rales.  Cardiovascular:     Normal rate. Regular rhythm.     Murmurs: There is no murmur.  Edema:    Peripheral edema absent.        Assessment and Plan:   Assessment & Plan Coronary Artery Disease (CAD) Non-obstructive CAD on cardiac catheterization in September 2015. Recent EKG changes noted with new T wave inversions.  Recent echocardiogram showed normal ejection fraction and borderline dilated ascending aorta at 39 mm, mild diastolic dysfunction. PET MPI scheduled in June.  He is not having chest discomfort or shortness of breath to suggest angina. - Proceed with PET MPI as planned - Follow-up as planned with Dr. Large  Hypertension Blood pressure readings have been borderline/elevated in the past.  Carvedilol  was previously prescribed but has not been taken for months.  - Start carvedilol  3.125 mg twice daily  Hyperlipidemia Plan was to start Nexlizet .  However, he needs a follow-up lipid  panel.  This will be obtained today while he is here.        Dispo:  Return in about 4 months (around 03/10/2024) for Planned follow up, w/ Dr. Michele.  Signed, Glendia Ferrier, PA-C   "

## 2023-11-09 ENCOUNTER — Other Ambulatory Visit: Payer: Self-pay | Admitting: *Deleted

## 2023-11-09 ENCOUNTER — Ambulatory Visit: Attending: Internal Medicine | Admitting: Physician Assistant

## 2023-11-09 ENCOUNTER — Encounter: Payer: Self-pay | Admitting: Physician Assistant

## 2023-11-09 VITALS — BP 138/88 | HR 94 | Ht 74.0 in | Wt 201.2 lb

## 2023-11-09 DIAGNOSIS — I10 Essential (primary) hypertension: Secondary | ICD-10-CM

## 2023-11-09 DIAGNOSIS — E7800 Pure hypercholesterolemia, unspecified: Secondary | ICD-10-CM

## 2023-11-09 DIAGNOSIS — I2510 Atherosclerotic heart disease of native coronary artery without angina pectoris: Secondary | ICD-10-CM | POA: Diagnosis not present

## 2023-11-09 DIAGNOSIS — E782 Mixed hyperlipidemia: Secondary | ICD-10-CM | POA: Diagnosis not present

## 2023-11-09 LAB — LIPID PANEL
Chol/HDL Ratio: 2.8 ratio (ref 0.0–5.0)
Cholesterol, Total: 269 mg/dL — ABNORMAL HIGH (ref 100–199)
HDL: 96 mg/dL (ref >39)
LDL Chol Calc (NIH): 155 mg/dL — ABNORMAL HIGH (ref 0–99)
Triglycerides: 105 mg/dL (ref 0–149)
VLDL Cholesterol Cal: 18 mg/dL (ref 5–40)

## 2023-11-09 LAB — LDL CHOLESTEROL, DIRECT: LDL Direct: 160 mg/dL — ABNORMAL HIGH (ref 0–99)

## 2023-11-09 MED ORDER — CARVEDILOL 3.125 MG PO TABS: 3.1250 mg | ORAL_TABLET | Freq: Two times a day (BID) | ORAL | 3 refills | Status: DC

## 2023-11-09 NOTE — Patient Instructions (Signed)
 Medication Instructions:  Your physician has recommended you make the following change in your medication:  START Carvedilol (Coreg) 3.125 taking 1 twice a day  *If you need a refill on your cardiac medications before your next appointment, please call your pharmacy*  Lab Work: Please go to the 1st floor to [facility] for your labwork  If you have labs (blood work) drawn today and your tests are completely normal, you will receive your results only by: MyChart Message (if you have MyChart) OR A paper copy in the mail If you have any lab test that is abnormal or we need to change your treatment, we will call you to review the results.  Testing/Procedures: None ordered  Follow-Up: At [facility], you and your health needs are our priority.  As part of our continuing mission to provide you with exceptional heart care, our providers are all part of one team.  This team includes your primary Cardiologist (physician) and Advanced Practice Providers or APPs (Physician Assistants and Nurse Practitioners) who all work together to provide you with the care you need, when you need it.  Your next appointment:   3 month(s)  Provider:   Tessa Lerner, DO     We recommend signing up for the patient portal called "MyChart".  Sign up information is provided on this After Visit Summary.  MyChart is used to connect with patients for Virtual Visits (Telemedicine).  Patients are able to view lab/test results, encounter notes, upcoming appointments, etc.  Non-urgent messages can be sent to your provider as well.   To learn more about what you can do with MyChart, go to [email or web address].   Other Instructions       1st Floor: - Lobby - Registration  - Pharmacy  - Lab - Cafe  2nd Floor: - PV Lab - Diagnostic Testing (echo, CT, nuclear med)  3rd Floor: - Vacant  4th Floor: - TCTS (cardiothoracic surgery) - AFib Clinic - Structural Heart Clinic - Vascular Surgery  - Vascular  Ultrasound  5th Floor: - HeartCare Cardiology (general and EP) - Clinical Pharmacy for coumadin, hypertension, lipid, weight-loss medications, and med management appointments    Valet parking services will be available as well.

## 2023-11-11 NOTE — Progress Notes (Signed)
 Scott,  Do you know if it was approved?  ST

## 2023-11-12 ENCOUNTER — Telehealth: Payer: Self-pay | Admitting: Pharmacy Technician

## 2023-11-12 ENCOUNTER — Other Ambulatory Visit (HOSPITAL_COMMUNITY): Payer: Self-pay

## 2023-11-12 NOTE — Telephone Encounter (Signed)
 Pharmacy Patient Advocate Encounter   Received notification from  result notes  that prior authorization for nexlizet is required/requested. Had to send appeal   Insurance verification completed.   The patient is insured through [place] .   Per test claim: PA required; PA submitted to above mentioned insurance via Fax Key/confirmation #[id number] had to fax appeal wouldn't let me renew Status is pending

## 2023-11-15 ENCOUNTER — Other Ambulatory Visit: Payer: Self-pay

## 2023-11-15 ENCOUNTER — Other Ambulatory Visit (HOSPITAL_COMMUNITY): Payer: Self-pay

## 2023-11-15 DIAGNOSIS — E782 Mixed hyperlipidemia: Secondary | ICD-10-CM

## 2023-11-15 MED ORDER — NEXLIZET 180-10 MG PO TABS: 1.0000 | ORAL_TABLET | Freq: Every morning | ORAL | 3 refills | Status: DC

## 2023-11-15 NOTE — Telephone Encounter (Signed)
 Pharmacy Patient Advocate Encounter  Received notification from [facility] that Prior Authorization for nexlizet has been APPROVED from 11/14/23 to 05/15/24. Ran test claim, Copay is $279.61. This test claim was processed through [facility]- copay amounts may vary at other pharmacies due to pharmacy/plan contracts, or as the patient moves through the different stages of their insurance plan.  I was asked to do a prior authorization but I didn't see a prescription in his chart so I'm sending to you guys. Thank you!

## 2023-11-15 NOTE — Telephone Encounter (Signed)
 LVM per DPR on file explaining that Nexlizet has been sent to their pharmacy on file (and if they need a different pharmacy than the [facility] on [address] to call our office and let us know) and the prior auth for Nexlizet has been approved.

## 2023-11-23 ENCOUNTER — Other Ambulatory Visit: Payer: Self-pay | Admitting: Psychiatry

## 2023-11-23 DIAGNOSIS — F5105 Insomnia due to other mental disorder: Secondary | ICD-10-CM

## 2023-11-25 NOTE — Telephone Encounter (Signed)
Due 4/23.

## 2023-11-25 NOTE — Telephone Encounter (Signed)
Due back in6 months

## 2023-11-30 ENCOUNTER — Ambulatory Visit: Payer: Medicare Other | Admitting: Podiatry

## 2023-11-30 ENCOUNTER — Encounter: Payer: Self-pay | Admitting: Podiatry

## 2023-11-30 DIAGNOSIS — M79674 Pain in right toe(s): Secondary | ICD-10-CM

## 2023-11-30 DIAGNOSIS — B351 Tinea unguium: Secondary | ICD-10-CM

## 2023-11-30 DIAGNOSIS — M79675 Pain in left toe(s): Secondary | ICD-10-CM | POA: Diagnosis not present

## 2023-11-30 DIAGNOSIS — G629 Polyneuropathy, unspecified: Secondary | ICD-10-CM | POA: Diagnosis not present

## 2023-11-30 NOTE — Progress Notes (Signed)
 This patient returns to my office for at risk foot care.  This patient requires this care by a professional since this patient will be at risk due to having neuropathy.  Patient is taking neurontin. Patient was referred to this office by Dr.  Tenny Craw.  This patient is unable to cut nails himself since the patient cannot reach his nails.These nails are painful walking and wearing shoes.  This patient presents for at risk foot care today.  General Appearance  Alert, conversant and in no acute stress.  Vascular  Dorsalis pedis and posterior tibial  pulses are palpable  bilaterally.  Capillary return is within normal limits  bilaterally. Temperature is within normal limits  bilaterally.  Neurologic  Senn-Weinstein monofilament wire test diminished  bilaterally. Muscle power within normal limits bilaterally.  Nails Thick disfigured discolored nails with subungual debris  hallux nails  left.. No evidence of bacterial infection or drainage bilaterally.  Orthopedic  No limitations of motion  feet .  No crepitus or effusions noted.  No bony pathology or digital deformities noted.  HAV  right foot.  Skin  normotropic skin with no porokeratosis noted bilaterally.  No signs of infections or ulcers noted.     Onychomycosis  Pain in right toes  Pain in left toes  Consent was obtained for treatment procedures.   Mechanical debridement of nails 1-5  bilaterally performed with a nail nipper.  Filed with dremel without incident.  .       Return office visit  3   months                    Told patient to return for periodic foot care and evaluation due to potential at risk complications.   Helane Gunther DPM

## 2023-12-10 ENCOUNTER — Telehealth: Payer: Self-pay | Admitting: Psychiatry

## 2023-12-10 DIAGNOSIS — R3914 Feeling of incomplete bladder emptying: Secondary | ICD-10-CM | POA: Diagnosis not present

## 2023-12-10 NOTE — Telephone Encounter (Signed)
 Eduardo Watts called at 9:10 to request a refill of his Lorazepam .  He has found that he needs to take 2/night to get to sleep.  A prescription was sent in 4/21 but it was for 1/day and since he is taking 2/night it will not last until 5/21 fill date.  He want to know if you will send in a prescription for #10 to get him to the fill date of 5/21.  But he will also need to refills to be for 2/night.  Appt 10/2.,  Send to  [facility] [address], [place] - [address]

## 2023-12-10 NOTE — Telephone Encounter (Signed)
 LVM to [facility]

## 2023-12-10 NOTE — Telephone Encounter (Signed)
 Pt last seen 4/1.  Sleep better and consistent 6 hours Lorazepam  1 mg and hydroxyzine  20 mg HS  He has been getting 1 mg, #30 since July 2024. He is asking for #60. Reporting he needs 2 for sleep.  He last filled 4/22 and is not due for RF, even if taking 2 at bedtime.

## 2023-12-11 NOTE — Telephone Encounter (Signed)
 Pt reporting that he took 2 lorazepam  for several nights for sleep. Said he is now taking just one, which is what was prescribed. He reports having 6 tablets left. He is asking for enough tablets to get him to his next full RF. He was seen on 4/1 with FU 10/2.   Pt is irritable and rigid.   Sleep better and consistent 6 hours Lorazepam  1 mg and hydroxyzine  20 mg HS  11/27/2023 11/26/2023 1  Lorazepam  1 Mg Tablet 30.00 30 Ca Cot [id number] [facility] (1224) 0/5 1.00 LME Medicare [place] 10/31/2023 10/31/2023 1  Lorazepam  1 Mg Tablet 30.00 30 Ca Cot [id number] [facility] (1224) 0/0 1.00 LME Medicare [place] 10/04/2023 08/09/2023 1  Lorazepam  1 Mg Tablet 30.00 30 Ca Cot [id number] [facility] (1224) 2/2 1.00 LME Medicare [place] 09/06/2023 08/09/2023 1  Lorazepam  1 Mg Tablet 30.00 30 Ca Cot [id number] [facility] (1224) 1/2 1.00 LME Medicare  08/09/2023 08/09/2023 1  Lorazepam  1 Mg Tablet 30.00 30

## 2023-12-13 ENCOUNTER — Other Ambulatory Visit: Payer: Self-pay | Admitting: Psychiatry

## 2023-12-13 DIAGNOSIS — F5105 Insomnia due to other mental disorder: Secondary | ICD-10-CM

## 2023-12-13 MED ORDER — LORAZEPAM 1 MG PO TABS: 1.0000 mg | ORAL_TABLET | Freq: Every day | ORAL | 0 refills | Status: DC

## 2023-12-13 NOTE — Telephone Encounter (Signed)
 Sent 1/4

## 2023-12-18 ENCOUNTER — Telehealth: Payer: Self-pay | Admitting: Psychiatry

## 2023-12-18 NOTE — Telephone Encounter (Signed)
 Patient was taking 2 Ativan  for sleep. You sent in an additional RF on 5/8. Patient said he did not know about the Rx. He is taking 1 Ativan  and not sleeping. He said he is weak and feels like he may need to go to the hospital. He has picked up the 14 extra Ativan  sent in, but 1 Ativan  is not getting him to sleep. He feels like he is decompensating and is asking for something else to help him sleep.

## 2023-12-18 NOTE — Telephone Encounter (Signed)
 Put him on schedule 5/14 at 1130 am.  Ok to do it virtually by phone if necessary

## 2023-12-18 NOTE — Telephone Encounter (Signed)
Put him on cancellation list.   

## 2023-12-18 NOTE — Telephone Encounter (Signed)
 Eduardo Watts called in today at 9am. He has had trouble sleeping the past five nights. He is taking 1 Ativan  to try to sleep and it lasts about 1 1/2 hrs.  He would like to get something to help with sleep. He's concerned that he is feeling bad and going downhill.  Can call it in to [facility] on [address].  Please call him back with med and instructions.

## 2023-12-19 ENCOUNTER — Encounter: Payer: Self-pay | Admitting: Psychiatry

## 2023-12-19 ENCOUNTER — Ambulatory Visit: Admitting: Psychiatry

## 2023-12-19 DIAGNOSIS — G629 Polyneuropathy, unspecified: Secondary | ICD-10-CM

## 2023-12-19 DIAGNOSIS — F5105 Insomnia due to other mental disorder: Secondary | ICD-10-CM | POA: Diagnosis not present

## 2023-12-19 DIAGNOSIS — F99 Mental disorder, not otherwise specified: Secondary | ICD-10-CM

## 2023-12-19 DIAGNOSIS — F10282 Alcohol dependence with alcohol-induced sleep disorder: Secondary | ICD-10-CM

## 2023-12-19 NOTE — Telephone Encounter (Signed)
 FYI, he was referred to [facility] after visit today and lorazepam  stopped DT alcohol dependence.  He indicated he would go.   Please check on him Thursday or Friday to see that he went to get tx.  I do not feel comfortable that he could comply with outpatient detox in this setting.

## 2023-12-19 NOTE — Progress Notes (Signed)
 Eduardo Watts [id number] 1945/08/13 78 y.o.   Subjective:   Patient ID:  Eduardo Watts. is a 78 y.o. (DOB [birth year]) male.  Chief Complaint:  Chief Complaint  Patient presents with   Follow-up   Anxiety   Sleeping Problem   Care Management   Alcohol Problem    Eduardo Watts. Eduardo Watts presents to the office today for follow-up of sleep, anxiety and alcohol abuse.  seen 11/2019.  No meds changed.  He was not significantly depressed or anxious but was needing medication for insomnia. Meds: Flexeril  10 HS, Gabapentin  300 HS for neuropathy helps a little, lorazepam  0.5mg  2 HS,   sleeps good with that.  05/06/20 appt noted: CO neuropathy in feet with numbness and tingling in feet.   Seeing neurologist.  No change in meds so far.  He's prediabetic. This is causing a balance issue.   Sleep well with lorazepam .  Vaccinated. Plan: no changes  08/26/2020 appt noted: [name] Dizziness when gets up in the AM.  Neuropathy problems.  Increased gabapentin  which he seemed to tolerate.  Fall the other day.  Energy not as good.  Lorazepam  & hydroxyzine  still helps sleep.  Celebrated 50 year anniversary party.  Good time.    Wife concerned about his meds and wonders if they are affecting his meds.    02/17/2021 appt noted: Neuropathy worse.  Tried gabapentin  without help.  Balance is off badly.  Can't shower standing up.  Not much pain.  On Lyrica.   Sleep well with meds about 6 hours.  Can get up at night to bedtime without problems.   Drinking a bottle of wine per day Wife concerned about his alcohol.  Doesn't want to attend AA bc of having to talk in public over it. Patient reports stable mood and denies depressed or irritable moods usually.  Patient denies any recent difficulty with anxiety.  Patient denies difficulty with sleep initiation or maintenance. Ativan  1 mg and hydroxyzine  help sleep.  Some mild EMA.  8 hours.   Denies appetite disturbance.  Patient reports that  appetite,  and motivation have been good.  Patient denies any difficulty with concentration.  Patient denies any suicidal ideation.  Occ name recall problems.  12/28/21 appt noted: CC neuropathy with several consults. Neuropathy causing depression. F died 2 and 1/2 years ago. Numbness, stinging, loss of balance esp in shower. Loss of interests, reduced interests reading, Bible, church.  Thinking about best days behind him.  06/07/22 appt noted: Upset over [name] decisions.   She is their only child.   He and wife very upset.  Happened in the summer. 1 GD 6th grade.  D and ex-son-in-law share custody.   Never tried duloxetine  60 and no benefit with 30 mg daily for neuropathy. No SE.  Has had some falls DT neuropathy.  Cracked a rib and hurt arm.  Neuro work up was neg.    01/30/23 appt noted: Eduardo Watts fell 2 mos ago and broke 3 vertebrae and paralyzed waist down.  She is walking again.   All her life weakness on R side.  Predisposed to falling.  Is doing fair.  Psych meds: duloxetine  60, lorazepam  1 mg HS, hydroxyzine  30 mg HS. Ongoing stress with [name] behavior.   Mentally OK not perfect.  Physically can tell effect.  Neuropathy is persistent and  uncurable.  Numb feet and ankles.  Search internet all the time for it.  Causes px with balance.  Working to cope  with it.  Had some falls early on.  Still doing some work and it helps a little.  Something positive to focus on. Dependent on lorazepam  1 mg HS helps sleep with hydroxyzine  20 mg HS.  No SE except a little dull in the AM.  Prostate surgery helped with bladder issues somewhat.   Not full control of alcohol.  Appt with PCP July to evaluate liver enzymes.  Down to 1/2 bottle wine per day. Also dx with fatty liver.   Dr. Cherly Corners  11/06/23 appt noted:  Med: lorazepam   Mediocre overall.   Sleep ok with lorazepam  for 6 hours then wide awake.  Not drowsy during the day.   Med: Duloxetine  60, hydroxyzine  10-20  TID prn anxiety .  Lorazepam  1 mg  HS. Wife not supportive of our relationship.   Not drinking much.  2-3 glasses per day with wine.   Family background Pentecostal and against alcohol. Doesn't want to retire.   Peripheral Neuropathy a little worse.  Been to several neurologists.  No panin and no swelling but numbness.   Blood sugar ok.  Prediabetic.  Tried metformin .  But read about it and stopped it.   12/19/23 appt noted:  urgent appt DT overtaking lorazepam  dT insomnia Med:  lorazepam  1-2 mg HS.  Went up on his own.;  hydroxyzine  10 mg Tab prn;  no duloxetine  Last several days cut lorazepam  to 0.5 mg HS and hasn't slept in 4 days.  Ran out of lorazepam  twice in the last month.  Complains of severe insomnia without lorazepam .  Not sure why this px got worse but admits to drinking more.  Drinking a bottle daily.   Now complains of more sleep px for mos.  More panic attacks for a month.   Stopped duloxetine  a few months but not sure why.  Began to think I'm taking too many meds.   Doesn't think he can survive rehab bc doesn't think they will treat his insomnia.  Has had some SI but no intent nor plan.   Past Psychiatric Medication Trials:   Ambien fell out of bed,  Ativan , Xanax hives, Prosom, Klonopin SE, Librium detox,  Belsomra, hydrozyzine,  trazodone NR, cyclobenzaprine  helps,  No Seroquel DT B 's suicide,  Gabapentin  SE, Lyrica NR  Lithium,  fluoxetine, duloxetine  topiramate, propranolol, Naltrexone     Review of Systems:  Review of Systems  Cardiovascular:  Negative for chest pain and palpitations.  Genitourinary:  Positive for difficulty urinating.  Neurological:  Positive for dizziness and numbness. Negative for tremors.       Neuropathy in feet affecting  balance  Psychiatric/Behavioral:  Positive for depression, dysphoric mood, sleep disturbance and suicidal ideas. Negative for agitation, behavioral problems, confusion, decreased concentration, hallucinations and self-injury. The patient is not  nervous/anxious and is not hyperactive.     Medications: I have reviewed the patient's current medications.  Current Outpatient Medications  Medication Sig Dispense Refill   ascorbic acid (VITAMIN C) 500 MG tablet Take 500 mg by mouth daily.     Bempedoic Acid-Ezetimibe  (NEXLIZET ) 180-10 MG TABS Take 1 tablet by mouth in the morning. 30 tablet 3   carvedilol  (COREG ) 3.125 MG tablet Take 1 tablet (3.125 mg total) by mouth 2 (two) times daily with a meal. 180 tablet 3   fluticasone  (FLONASE ) 50 MCG/ACT nasal spray Place 1 spray into both nostrils daily.      hydrOXYzine  (ATARAX ) 10 MG tablet TAKE 2 TABLETS BY MOUTH THREE TIMES DAILY AS  NEEDED FOR ANXIETY 180 tablet 0   Omega-3 Fatty Acids (FISH OIL PO) Take 1 capsule by mouth daily.     thiamine  100 MG tablet Take 1 tablet (100 mg total) by mouth daily. 30 tablet 0   Zinc 50 MG TABS Take 50 mg by mouth daily.     No current facility-administered medications for this visit.    Medication Side Effects: None  Allergies:  Allergies  Allergen Reactions   Simvastatin Other (See Comments)    Muscle aches   Lorazepam      Dizzy/unsteady  Other Reaction(s): Dizzy, unsteady   Trazodone And Nefazodone Other (See Comments)    "crazy"    Past Medical History:  Diagnosis Date   Anxiety    Asthma    as a child   Depression    sees Dr Toi Foster   History of BPH    Dr.Dahlstedt   Hyperlipidemia    Pre-diabetes    Prediabetes    Sleep disturbances     Family History  Problem Relation Age of Onset   Dementia Mother    Stroke Mother    Cancer - Prostate Father    Melanoma Father    Hyperlipidemia Sister        high cholestrol   Depression Brother    Suicidality Brother    Lung disease Maternal Grandfather    Heart attack Neg Hx    Hypertension Neg Hx     Social History   Socioeconomic History   Marital status: Married    Spouse name: Not on file   Number of children: 1   Years of education: 17   Highest education level:  Not on file  Occupational History   Not on file  Tobacco Use   Smoking status: Never   Smokeless tobacco: Never  Vaping Use   Vaping status: Never Used  Substance and Sexual Activity   Alcohol use: Yes    Alcohol/week: 14.0 - 21.0 standard drinks of alcohol    Types: 14 - 21 Glasses of wine per week    Comment: occ wine   Drug use: No   Sexual activity: Not on file  Other Topics Concern   Not on file  Social History Narrative   Never smoked   Alcohol yes -2-3 glasses of wine/night   Caffeine- yes 1 cup of coffee/day   Recreational drug use -No   Diet- fruits and vegetables,less fried food more pasta   Exercise- decrease to fatigue ,does yard work,and  walks occasionally    Occupation -employed Airline pilot   Married 1 daughter/ 1 granddaughter   Goes by [employer]" loves animals   Social Drivers of Corporate investment banker Strain: Not on file  Food Insecurity: No Food Insecurity (08/17/2022)   Hunger Vital Sign    Worried About Running Out of Food in the Last Year: Never true    Ran Out of Food in the Last Year: Never true  Transportation Needs: No Transportation Needs (08/17/2022)   PRAPARE - Administrator, Civil Service (Medical): No    Lack of Transportation (Non-Medical): No  Physical Activity: Not on file  Stress: Not on file  Social Connections: Not on file  Intimate Partner Violence: Not At Risk (08/17/2022)   Humiliation, Afraid, Rape, and Kick questionnaire    Fear of Current or Ex-Partner: No    Emotionally Abused: No    Physically Abused: No    Sexually Abused: No    Past Medical History, Surgical  history, Social history, and Family history were reviewed and updated as appropriate.   Please see review of systems for further details on the patient's review from today.   Objective:   Physical Exam:  There were no vitals taken for this visit.  Physical Exam Constitutional:      General: He is not in acute distress.    Appearance: He is  well-developed.  Musculoskeletal:        General: No deformity.  Neurological:     Mental Status: He is alert and oriented to person, place, and time.     Motor: Tremor present.     Coordination: Coordination abnormal.     Gait: Gait abnormal.     Comments: Slower gait Mild tremor  Psychiatric:        Attention and Perception: Attention and perception normal.        Mood and Affect: Mood is anxious and depressed. Affect is not labile, blunt, angry or inappropriate.        Speech: Speech normal. Speech is not rapid and pressured.        Behavior: Behavior normal.        Thought Content: Thought content is not delusional. Thought content includes suicidal ideation. Thought content does not include homicidal ideation. Thought content does not include suicidal plan.        Cognition and Memory: Cognition normal.        Judgment: Judgment normal.     Comments: Insight intact. No auditory or visual hallucinations. No delusions.  More stressed over his sleep      Lab Review:     Component Value Date/Time   NA 138 08/09/2022 0820   NA 136 05/24/2017 0845   K 4.7 08/09/2022 0820   CL 103 08/09/2022 0820   CO2 25 08/09/2022 0820   GLUCOSE 187 (H) 08/09/2022 0820   BUN 15 08/09/2022 0820   BUN 11 05/24/2017 0845   CREATININE 1.02 08/09/2022 0820   CALCIUM  9.5 08/09/2022 0820   PROT 6.7 10/31/2020 0528   PROT 7.0 04/15/2019 0854   ALBUMIN 3.7 10/31/2020 0528   ALBUMIN 4.5 05/24/2017 0845   AST 27 10/31/2020 0528   ALT 27 10/31/2020 0528   ALKPHOS 86 10/31/2020 0528   BILITOT 0.7 10/31/2020 0528   BILITOT 1.1 05/24/2017 0845   GFRNONAA >60 08/09/2022 0820   GFRAA 99 05/24/2017 0845       Component Value Date/Time   WBC 7.5 08/09/2022 0820   RBC 4.93 08/09/2022 0820   HGB 15.8 08/09/2022 0820   HCT 48.1 08/09/2022 0820   PLT 262 08/09/2022 0820   MCV 97.6 08/09/2022 0820   MCH 32.0 08/09/2022 0820   MCHC 32.8 08/09/2022 0820   RDW 14.0 08/09/2022 0820   LYMPHSABS 0.8  10/31/2020 0528   MONOABS 0.7 10/31/2020 0528   EOSABS 0.2 10/31/2020 0528   BASOSABS 0.0 10/31/2020 0528    No results found for: "POCLITH", "LITHIUM"   No results found for: "PHENYTOIN", "PHENOBARB", "VALPROATE", "CBMZ"   .res Assessment: Plan:    Alcohol dependence with alcohol-induced sleep disorder (HCC)  Insomnia due to other mental disorder  Neuropathy   45 min appt with following disc: Patient has run out of lorazepam  twice in the last month due to over taking it because of complaints of worsening insomnia.  He admits to an increased alcohol intake up to a bottle of wine nightly.  He does not think he can survive without taking lorazepam  because his sleep is  so poor without it.  Extensive discussion around the progression of his alcohol dependence.  It has now progressed to the point that he is started exceeding recommended dosages of the lorazepam  without consent from the physician.  This was confronted as a problem manner and of itself that could lead to greater complications of alcohol withdrawal as well as benzodiazepine withdrawal which can exacerbate 1 another and further worsen his sleep problem. He does not seem capable of restraining his use of lorazepam  and does not seem to fully grasp of the problem.  This is perhaps denial.  He is mildly shaky at this time.  We discussed alcohol withdrawal and benzodiazepine withdrawal.  Because he is not able to use the lorazepam  as prescribed any longer we will no longer be able to prescribe it to him. He needs inpatient detox and then rehab for alcohol dependence.  We discussed this at length as well as his half hesitance and concerns.  [facility]. Doing worse with alcohol;  up to a bottle daily and now is using it with lorazepam .   Needs rehab and strongly rec detox and [facility].  Called [facility] with referral info.  We discussed the short-term risks associated with benzodiazepines including  sedation and increased fall risk among others.  Discussed long-term side effect risk including dependence, potential withdrawal symptoms, and the potential eventual dose-related risk of dementia.  Newer studies refute the risk    Seen several doctors about neuropathy with numbness and was told nothing will help it.  FU after DC  Nori Beat, MD, DFAPA     Please see After Visit Summary for patient specific instructions.  Future Appointments  Date Time Provider Department Center  01/29/2024 10:00 AM [facility] PET CT 1 [facility]   02/04/2024  9:00 AM Rosa College D, MD [facility] None  02/19/2024  8:00 AM Olinda Bertrand, DO [facility] H[facility]V  02/29/2024  7:30 AM Ruffin Cotton, DPM [facility]  05/08/2024  9:00 AM Cottle, Kennedy Peabody., MD [facility] None    No orders of the defined types were placed in this encounter.     -------------------------------

## 2023-12-20 NOTE — Telephone Encounter (Signed)
 Spoke with pt's wife, [name] [phone] and she has been advised to take pt in the morning to [facility] ER to be assessed then they should be able to find a place to go for alcohol detox. Pt's insurance does cover [facility]. Wife reports pt had been drinking already today and did not want to go to ER this afternoon. She has been instructed to call us  back if she has further questions or concerns and to keep us  updated.  Informed her I would update Dr. Toi Foster as well.

## 2023-12-20 NOTE — Telephone Encounter (Signed)
 Discussed with Dr. Toi Foster, will contact pt to confirm his status

## 2023-12-20 NOTE — Telephone Encounter (Signed)
 Currently trying to find another facility for pt to go due to [facility] turning him away due to his insurance, [employer]. Contacting back of pt's insurance card with covered facilities. If not will send pt to [facility] or [facility] to get medically cleared then they will find him a location.

## 2023-12-21 ENCOUNTER — Other Ambulatory Visit: Payer: Self-pay

## 2023-12-21 ENCOUNTER — Inpatient Hospital Stay
Admission: AD | Admit: 2023-12-21 | Discharge: 2023-12-27 | DRG: 881 | Disposition: A | Discharge status: Home / Self Care | Source: Intra-hospital | Attending: Psychiatry | Admitting: Psychiatry

## 2023-12-21 ENCOUNTER — Emergency Department (HOSPITAL_COMMUNITY)
Admission: EM | Admit: 2023-12-21 | Discharge: 2023-12-21 | Disposition: A | Discharge status: Other Acute Inpatient Hospital | Attending: Emergency Medicine | Admitting: Emergency Medicine

## 2023-12-21 ENCOUNTER — Encounter (HOSPITAL_COMMUNITY): Payer: Self-pay

## 2023-12-21 ENCOUNTER — Other Ambulatory Visit (HOSPITAL_COMMUNITY)
Admission: EM | Admit: 2023-12-21 | Discharge: 2023-12-21 | Disposition: A | Discharge status: Other Acute Inpatient Hospital | Source: Home / Self Care

## 2023-12-21 DIAGNOSIS — Z83438 Family history of other disorder of lipoprotein metabolism and other lipidemia: Secondary | ICD-10-CM

## 2023-12-21 DIAGNOSIS — F411 Generalized anxiety disorder: Secondary | ICD-10-CM | POA: Diagnosis present

## 2023-12-21 DIAGNOSIS — J45909 Unspecified asthma, uncomplicated: Secondary | ICD-10-CM | POA: Diagnosis not present

## 2023-12-21 DIAGNOSIS — R7303 Prediabetes: Secondary | ICD-10-CM | POA: Diagnosis present

## 2023-12-21 DIAGNOSIS — F1010 Alcohol abuse, uncomplicated: Secondary | ICD-10-CM

## 2023-12-21 DIAGNOSIS — E871 Hypo-osmolality and hyponatremia: Secondary | ICD-10-CM | POA: Insufficient documentation

## 2023-12-21 DIAGNOSIS — F1310 Sedative, hypnotic or anxiolytic abuse, uncomplicated: Secondary | ICD-10-CM | POA: Diagnosis present

## 2023-12-21 DIAGNOSIS — F1020 Alcohol dependence, uncomplicated: Secondary | ICD-10-CM | POA: Diagnosis present

## 2023-12-21 DIAGNOSIS — N400 Enlarged prostate without lower urinary tract symptoms: Secondary | ICD-10-CM | POA: Diagnosis present

## 2023-12-21 DIAGNOSIS — R45851 Suicidal ideations: Secondary | ICD-10-CM | POA: Diagnosis not present

## 2023-12-21 DIAGNOSIS — Y906 Blood alcohol level of 120-199 mg/100 ml: Secondary | ICD-10-CM | POA: Insufficient documentation

## 2023-12-21 DIAGNOSIS — F329 Major depressive disorder, single episode, unspecified: Secondary | ICD-10-CM | POA: Diagnosis present

## 2023-12-21 DIAGNOSIS — Z5941 Food insecurity: Secondary | ICD-10-CM

## 2023-12-21 DIAGNOSIS — Z818 Family history of other mental and behavioral disorders: Secondary | ICD-10-CM | POA: Diagnosis not present

## 2023-12-21 DIAGNOSIS — Z79899 Other long term (current) drug therapy: Secondary | ICD-10-CM

## 2023-12-21 DIAGNOSIS — Z888 Allergy status to other drugs, medicaments and biological substances status: Secondary | ICD-10-CM | POA: Diagnosis not present

## 2023-12-21 DIAGNOSIS — Z823 Family history of stroke: Secondary | ICD-10-CM

## 2023-12-21 DIAGNOSIS — F332 Major depressive disorder, recurrent severe without psychotic features: Secondary | ICD-10-CM | POA: Diagnosis not present

## 2023-12-21 DIAGNOSIS — Z808 Family history of malignant neoplasm of other organs or systems: Secondary | ICD-10-CM | POA: Diagnosis not present

## 2023-12-21 DIAGNOSIS — F331 Major depressive disorder, recurrent, moderate: Secondary | ICD-10-CM | POA: Insufficient documentation

## 2023-12-21 DIAGNOSIS — F419 Anxiety disorder, unspecified: Secondary | ICD-10-CM | POA: Insufficient documentation

## 2023-12-21 DIAGNOSIS — E785 Hyperlipidemia, unspecified: Secondary | ICD-10-CM | POA: Diagnosis present

## 2023-12-21 DIAGNOSIS — F1090 Alcohol use, unspecified, uncomplicated: Secondary | ICD-10-CM | POA: Diagnosis not present

## 2023-12-21 DIAGNOSIS — Z9079 Acquired absence of other genital organ(s): Secondary | ICD-10-CM | POA: Diagnosis not present

## 2023-12-21 DIAGNOSIS — F410 Panic disorder [episodic paroxysmal anxiety] without agoraphobia: Secondary | ICD-10-CM | POA: Diagnosis present

## 2023-12-21 LAB — RAPID URINE DRUG SCREEN, HOSP PERFORMED
Amphetamines: NOT DETECTED
Barbiturates: NOT DETECTED
Benzodiazepines: POSITIVE — AB
Cocaine: NOT DETECTED
Opiates: NOT DETECTED
Tetrahydrocannabinol: NOT DETECTED

## 2023-12-21 LAB — COMPREHENSIVE METABOLIC PANEL WITH GFR
ALT: 39 U/L (ref 0–44)
ALT: 35 U/L (ref 0–44)
AST: 52 U/L — ABNORMAL HIGH (ref 15–41)
AST: 41 U/L (ref 15–41)
Albumin: 3.8 g/dL (ref 3.5–5.0)
Albumin: 3.5 g/dL (ref 3.5–5.0)
Alkaline Phosphatase: 81 U/L (ref 38–126)
Alkaline Phosphatase: 73 U/L (ref 38–126)
Anion gap: 16 — ABNORMAL HIGH (ref 5–15)
Anion gap: 14 (ref 5–15)
BUN: 12 mg/dL (ref 8–23)
BUN: 9 mg/dL (ref 8–23)
CO2: 16 mmol/L — ABNORMAL LOW (ref 22–32)
CO2: 19 mmol/L — ABNORMAL LOW (ref 22–32)
Calcium: 8.4 mg/dL — ABNORMAL LOW (ref 8.9–10.3)
Calcium: 7.5 mg/dL — ABNORMAL LOW (ref 8.9–10.3)
Chloride: 93 mmol/L — ABNORMAL LOW (ref 98–111)
Chloride: 99 mmol/L (ref 98–111)
Creatinine, Ser: 0.9 mg/dL (ref 0.61–1.24)
Creatinine, Ser: 0.8 mg/dL (ref 0.61–1.24)
GFR, Estimated: >60 mL/min (ref >60)
GFR, Estimated: >60 mL/min (ref >60)
Glucose, Bld: 148 mg/dL — ABNORMAL HIGH (ref 70–99)
Glucose, Bld: 112 mg/dL — ABNORMAL HIGH (ref 70–99)
Potassium: 3.9 mmol/L (ref 3.5–5.1)
Potassium: 3.8 mmol/L (ref 3.5–5.1)
Sodium: 125 mmol/L — ABNORMAL LOW (ref 135–145)
Sodium: 132 mmol/L — ABNORMAL LOW (ref 135–145)
Total Bilirubin: 1.9 mg/dL — ABNORMAL HIGH (ref 0.0–1.2)
Total Bilirubin: 1.5 mg/dL — ABNORMAL HIGH (ref 0.0–1.2)
Total Protein: 7.5 g/dL (ref 6.5–8.1)
Total Protein: 6.8 g/dL (ref 6.5–8.1)

## 2023-12-21 LAB — CBC WITH DIFFERENTIAL/PLATELET
Abs Immature Granulocytes: 0.06 10*3/uL (ref 0.00–0.07)
Basophils Absolute: 0.1 10*3/uL (ref 0.0–0.1)
Basophils Relative: 1 %
Eosinophils Absolute: 0.1 10*3/uL (ref 0.0–0.5)
Eosinophils Relative: 1 %
HCT: 42.6 % (ref 39.0–52.0)
Hemoglobin: 14.3 g/dL (ref 13.0–17.0)
Immature Granulocytes: 1 %
Lymphocytes Relative: 10 %
Lymphs Abs: 0.7 10*3/uL (ref 0.7–4.0)
MCH: 33.7 pg (ref 26.0–34.0)
MCHC: 33.6 g/dL (ref 30.0–36.0)
MCV: 100.5 fL — ABNORMAL HIGH (ref 80.0–100.0)
Monocytes Absolute: 1.3 10*3/uL — ABNORMAL HIGH (ref 0.1–1.0)
Monocytes Relative: 17 %
Neutro Abs: 5.2 10*3/uL (ref 1.7–7.7)
Neutrophils Relative %: 70 %
Platelets: 205 10*3/uL (ref 150–400)
RBC: 4.24 MIL/uL (ref 4.22–5.81)
RDW: 13.1 % (ref 11.5–15.5)
WBC: 7.4 10*3/uL (ref 4.0–10.5)
nRBC: 0 % (ref 0.0–0.2)

## 2023-12-21 LAB — ACETAMINOPHEN LEVEL: Acetaminophen (Tylenol), Serum: <10 ug/mL — ABNORMAL LOW (ref 10–30)

## 2023-12-21 LAB — ETHANOL: Alcohol, Ethyl (B): 141 mg/dL — ABNORMAL HIGH (ref <15)

## 2023-12-21 LAB — SALICYLATE LEVEL: Salicylate Lvl: <7 mg/dL — ABNORMAL LOW (ref 7.0–30.0)

## 2023-12-21 LAB — SARS CORONAVIRUS 2 BY RT PCR: SARS Coronavirus 2 by RT PCR: NEGATIVE

## 2023-12-21 MED ORDER — FLUTICASONE PROPIONATE 50 MCG/ACT NA SUSP
1.0000 | Freq: Every day | NASAL | Status: DC
  Administered 2023-12-21: 1 via NASAL
  Filled 2023-12-21: qty 16

## 2023-12-21 MED ORDER — OLANZAPINE 10 MG IM SOLR: 5.0000 mg | Freq: Three times a day (TID) | INTRAMUSCULAR | Status: DC | PRN

## 2023-12-21 MED ORDER — LORAZEPAM 1 MG PO TABS
0.0000 mg | ORAL_TABLET | Freq: Four times a day (QID) | ORAL | Status: DC
  Administered 2023-12-21 (×2): 1 mg via ORAL
  Filled 2023-12-21 (×2): qty 1

## 2023-12-21 MED ORDER — ADULT MULTIVITAMIN W/MINERALS CH: 1.0000 | ORAL_TABLET | Freq: Every day | ORAL | Status: DC

## 2023-12-21 MED ORDER — CHLORDIAZEPOXIDE HCL 25 MG PO CAPS
25.0000 mg | ORAL_CAPSULE | Freq: Three times a day (TID) | ORAL | Status: AC
  Administered 2023-12-23 (×3): 25 mg via ORAL
  Filled 2023-12-21 (×3): qty 1

## 2023-12-21 MED ORDER — CHLORDIAZEPOXIDE HCL 25 MG PO CAPS: 25.0000 mg | ORAL_CAPSULE | Freq: Four times a day (QID) | ORAL | Status: DC | PRN

## 2023-12-21 MED ORDER — CHLORDIAZEPOXIDE HCL 25 MG PO CAPS
25.0000 mg | ORAL_CAPSULE | Freq: Four times a day (QID) | ORAL | Status: AC
  Administered 2023-12-22 (×4): 25 mg via ORAL
  Filled 2023-12-21 (×4): qty 1

## 2023-12-21 MED ORDER — ACETAMINOPHEN 325 MG PO TABS
650.0000 mg | ORAL_TABLET | Freq: Four times a day (QID) | ORAL | Status: DC | PRN
  Administered 2023-12-23: 650 mg via ORAL
  Filled 2023-12-21: qty 2

## 2023-12-21 MED ORDER — LORAZEPAM 2 MG/ML IJ SOLN: 0.0000 mg | Freq: Two times a day (BID) | INTRAMUSCULAR | Status: DC

## 2023-12-21 MED ORDER — LORAZEPAM 1 MG PO TABS: 0.0000 mg | ORAL_TABLET | Freq: Two times a day (BID) | ORAL | Status: DC

## 2023-12-21 MED ORDER — OLANZAPINE 5 MG PO TBDP: 5.0000 mg | ORAL_TABLET | Freq: Three times a day (TID) | ORAL | Status: DC | PRN

## 2023-12-21 MED ORDER — CHLORDIAZEPOXIDE HCL 25 MG PO CAPS: 25.0000 mg | ORAL_CAPSULE | Freq: Every day | ORAL | Status: DC

## 2023-12-21 MED ORDER — THIAMINE MONONITRATE 100 MG PO TABS
100.0000 mg | ORAL_TABLET | Freq: Every day | ORAL | Status: DC
  Administered 2023-12-22 – 2023-12-27 (×6): 100 mg via ORAL
  Filled 2023-12-21 (×6): qty 1

## 2023-12-21 MED ORDER — ESCITALOPRAM OXALATE 10 MG PO TABS
5.0000 mg | ORAL_TABLET | Freq: Every day | ORAL | Status: DC
  Administered 2023-12-22 – 2023-12-25 (×4): 5 mg via ORAL
  Filled 2023-12-21 (×4): qty 1

## 2023-12-21 MED ORDER — FLUTICASONE PROPIONATE 50 MCG/ACT NA SUSP
1.0000 | Freq: Every day | NASAL | Status: DC | PRN
  Administered 2023-12-22 – 2023-12-23 (×2): 1 via NASAL
  Filled 2023-12-21: qty 16

## 2023-12-21 MED ORDER — THIAMINE MONONITRATE 100 MG PO TABS: 100.0000 mg | ORAL_TABLET | Freq: Every day | ORAL | Status: DC

## 2023-12-21 MED ORDER — SODIUM CHLORIDE 0.9 % IV BOLUS
1000.0000 mL | Freq: Once | INTRAVENOUS | Status: AC
  Administered 2023-12-21: 1000 mL via INTRAVENOUS

## 2023-12-21 MED ORDER — ALUM & MAG HYDROXIDE-SIMETH 200-200-20 MG/5ML PO SUSP
30.0000 mL | ORAL | Status: DC | PRN
  Filled 2023-12-21: qty 30

## 2023-12-21 MED ORDER — CHLORDIAZEPOXIDE HCL 25 MG PO CAPS: 25.0000 mg | ORAL_CAPSULE | Freq: Four times a day (QID) | ORAL | Status: AC | PRN

## 2023-12-21 MED ORDER — LORAZEPAM 1 MG PO TABS: 0.0000 mg | ORAL_TABLET | Freq: Four times a day (QID) | ORAL | Status: DC

## 2023-12-21 MED ORDER — HYDROXYZINE HCL 25 MG PO TABS: 25.0000 mg | ORAL_TABLET | Freq: Three times a day (TID) | ORAL | Status: DC | PRN

## 2023-12-21 MED ORDER — CHLORDIAZEPOXIDE HCL 25 MG PO CAPS: 25.0000 mg | ORAL_CAPSULE | Freq: Three times a day (TID) | ORAL | Status: DC

## 2023-12-21 MED ORDER — LOPERAMIDE HCL 2 MG PO CAPS: 2.0000 mg | ORAL_CAPSULE | ORAL | Status: AC | PRN

## 2023-12-21 MED ORDER — MAGNESIUM HYDROXIDE 400 MG/5ML PO SUSP
30.0000 mL | Freq: Every day | ORAL | Status: DC | PRN
  Administered 2023-12-23 – 2023-12-26 (×4): 30 mL via ORAL
  Filled 2023-12-21 (×4): qty 30

## 2023-12-21 MED ORDER — THIAMINE HCL 100 MG/ML IJ SOLN: 100.0000 mg | Freq: Every day | INTRAMUSCULAR | Status: DC

## 2023-12-21 MED ORDER — ONDANSETRON 4 MG PO TBDP: 4.0000 mg | ORAL_TABLET | Freq: Four times a day (QID) | ORAL | Status: DC | PRN

## 2023-12-21 MED ORDER — OLANZAPINE 5 MG PO TBDP
5.0000 mg | ORAL_TABLET | Freq: Three times a day (TID) | ORAL | Status: DC | PRN
  Administered 2023-12-25: 5 mg via ORAL
  Filled 2023-12-21: qty 1

## 2023-12-21 MED ORDER — CHLORDIAZEPOXIDE HCL 25 MG PO CAPS
25.0000 mg | ORAL_CAPSULE | Freq: Every day | ORAL | Status: AC
  Administered 2023-12-25: 25 mg via ORAL
  Filled 2023-12-21: qty 1

## 2023-12-21 MED ORDER — HYDROXYZINE HCL 25 MG PO TABS: 25.0000 mg | ORAL_TABLET | Freq: Four times a day (QID) | ORAL | Status: DC | PRN

## 2023-12-21 MED ORDER — CHLORDIAZEPOXIDE HCL 25 MG PO CAPS
25.0000 mg | ORAL_CAPSULE | ORAL | Status: AC
Start: 2023-12-24 — End: 2023-12-24
  Administered 2023-12-24 (×2): 25 mg via ORAL
  Filled 2023-12-21 (×2): qty 1

## 2023-12-21 MED ORDER — THIAMINE MONONITRATE 100 MG PO TABS
100.0000 mg | ORAL_TABLET | Freq: Every day | ORAL | Status: DC
  Administered 2023-12-21: 100 mg via ORAL
  Filled 2023-12-21: qty 1

## 2023-12-21 MED ORDER — MIRTAZAPINE 15 MG PO TBDP: 7.0000 mg | ORAL_TABLET | Freq: Every day | ORAL | Status: DC

## 2023-12-21 MED ORDER — ACETAMINOPHEN 325 MG PO TABS: 650.0000 mg | ORAL_TABLET | Freq: Four times a day (QID) | ORAL | Status: DC | PRN

## 2023-12-21 MED ORDER — ONDANSETRON 4 MG PO TBDP: 4.0000 mg | ORAL_TABLET | Freq: Four times a day (QID) | ORAL | Status: AC | PRN

## 2023-12-21 MED ORDER — CHLORDIAZEPOXIDE HCL 25 MG PO CAPS
25.0000 mg | ORAL_CAPSULE | Freq: Four times a day (QID) | ORAL | Status: DC
  Administered 2023-12-21 (×2): 25 mg via ORAL
  Filled 2023-12-21 (×2): qty 1

## 2023-12-21 MED ORDER — ESCITALOPRAM OXALATE 5 MG PO TABS: 5.0000 mg | ORAL_TABLET | Freq: Every day | ORAL | Status: DC

## 2023-12-21 MED ORDER — CHLORDIAZEPOXIDE HCL 25 MG PO CAPS: 25.0000 mg | ORAL_CAPSULE | ORAL | Status: DC

## 2023-12-21 MED ORDER — ADULT MULTIVITAMIN W/MINERALS CH
1.0000 | ORAL_TABLET | Freq: Every day | ORAL | Status: DC
Start: 2023-12-22 — End: 2023-12-27
  Administered 2023-12-22 – 2023-12-27 (×6): 1 via ORAL
  Filled 2023-12-21 (×6): qty 1

## 2023-12-21 MED ORDER — MIRTAZAPINE 7.5 MG PO TABS
7.5000 mg | ORAL_TABLET | Freq: Every day | ORAL | Status: DC
  Administered 2023-12-21: 7.5 mg via ORAL
  Filled 2023-12-21: qty 1

## 2023-12-21 MED ORDER — LOPERAMIDE HCL 2 MG PO CAPS: 2.0000 mg | ORAL_CAPSULE | ORAL | Status: DC | PRN

## 2023-12-21 MED ORDER — LORAZEPAM 2 MG/ML IJ SOLN: 0.0000 mg | Freq: Four times a day (QID) | INTRAMUSCULAR | Status: DC

## 2023-12-21 MED ORDER — HYDROXYZINE HCL 25 MG PO TABS
25.0000 mg | ORAL_TABLET | Freq: Four times a day (QID) | ORAL | Status: AC | PRN
  Administered 2023-12-23: 25 mg via ORAL
  Filled 2023-12-21 (×3): qty 1

## 2023-12-21 MED ORDER — HYDROXYZINE HCL 25 MG PO TABS
25.0000 mg | ORAL_TABLET | Freq: Three times a day (TID) | ORAL | Status: DC | PRN
  Administered 2023-12-22 – 2023-12-26 (×9): 25 mg via ORAL
  Filled 2023-12-21 (×8): qty 1

## 2023-12-21 MED ORDER — ALUM & MAG HYDROXIDE-SIMETH 200-200-20 MG/5ML PO SUSP: 30.0000 mL | ORAL | Status: DC | PRN

## 2023-12-21 MED ORDER — ESCITALOPRAM OXALATE 10 MG PO TABS
5.0000 mg | ORAL_TABLET | Freq: Every day | ORAL | Status: DC
  Administered 2023-12-21: 5 mg via ORAL
  Filled 2023-12-21: qty 1

## 2023-12-21 MED ORDER — MAGNESIUM HYDROXIDE 400 MG/5ML PO SUSP: 30.0000 mL | Freq: Every day | ORAL | Status: DC | PRN
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
## 2023-12-21 NOTE — ED Notes (Signed)
 Report called to RN Tami in [place] Psych unit.Awaiting on safe Transport for disposition. Assessments remains unchanged at the moment.
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
## 2023-12-21 NOTE — ED Notes (Signed)
 Labs was sent to lab waiting for orders.

## 2023-12-21 NOTE — Group Note (Signed)
 Group Topic: Understanding Self  Group Date: 12/21/2023 Start Time: 1430 End Time: 1515 Facilitators: Dennis Fitting, NT  Department: [facility]  Number of Participants: 4  Group Focus: coping skills and relaxation Treatment Modality:  Psychoeducation Interventions utilized were exploration, patient education, story telling, and support Purpose: enhance coping skills, increase insight, and reinforce self-care  Name: Eduardo Watts. Date of Birth: 05-May-1946  MR: [id number]    Level of Participation: Patient did not participate in group Quality of Participation: Patient did not participate in group Interactions with others: N/A Mood/Affect: N/A Triggers (if applicable): N/A Cognition: N/A Progress: Other Response: Patient did not attend group Plan: patient will be encouraged to come to groups  Patients Problems:  Patient Active Problem List   Diagnosis Date Noted   MDD (major depressive disorder), recurrent severe, without psychosis (HCC) 12/21/2023   Generalized anxiety disorder 12/21/2023   Benzodiazepine abuse (HCC) 12/21/2023   MDD (major depressive disorder) 12/21/2023   BPH with obstruction/lower urinary tract symptoms 08/17/2022   Cerumen impaction 01/31/2022   Pain due to onychomycosis of toenails of both feet 01/21/2021   Multiple fractures of ribs, right side, initial encounter for closed fracture 10/29/2020   Conductive hearing loss of left ear with unrestricted hearing of right ear 11/05/2019   Impacted cerumen of left ear 11/05/2019   Peripheral polyneuropathy 04/15/2019   GERD (gastroesophageal reflux disease) 10/08/2018   Prediabetes 11/08/2016   Alcohol use 07/13/2015   Dyspnea on exertion 04/02/2015   Insomnia 04/02/2015   Palpitations 01/27/2015   Nocturnal hypoxia 01/27/2015   Right-sided chest wall pain 11/26/2014   Hypotension 09/08/2014   Cough 06/09/2014   Coronary atherosclerosis of native coronary artery  05/06/2014   Nonspecific abnormal results of cardiovascular function study 04/21/2014   Mixed hyperlipidemia 07/28/2013   Nonspecific abnormal electrocardiogram (ECG) (EKG) 07/28/2013

## 2023-12-21 NOTE — ED Notes (Signed)
 Patient planned to be transferred to [facility]ys[facility] unit. Patient's wife Jefferie Meyering [phone]) made aware of plan. Voluntary consent signed by patient. Environment secured, safety checks in place per facility policy.

## 2023-12-21 NOTE — [facility] Group Notes (Signed)
 SPIRITUALITY GROUP NOTE  Spirituality group facilitated by Wilkie Aye, MDiv, [facility].  Group Description: Group focused on topic of hope. Patients participated in facilitated discussion around topic, connecting with one another around experiences and definitions for hope. Group members engaged with visual explorer photos, reflecting on what hope looks like for them today. Group engaged in discussion around how their definitions of hope are present today in hospital.  Modalities: Psycho-social ed, Adlerian, Narrative, MI  Patient Progress: DID NOT ATTEND

## 2023-12-21 NOTE — ED Provider Notes (Signed)
 [place] EMERGENCY DEPARTMENT AT [facility] Provider Note   CSN: [id number] Arrival date & time: 12/21/23  0631     History  Chief Complaint  Patient presents with   Psychiatric Evaluation   Alcohol Problem   Suicidal   HPI Selena Rubano. is a 78 y.o. male with history of asthma, anxiety, depression alcohol use presenting for suicidal ideation and alcohol overuse concern.  Today reports that he does have subtle thoughts occasionally but no active plan but did mention to the triage staff that "I have a gun".  He reports that he drinks about a bottle of wine daily and has done so over 20 years.  Was recently seen by his psychiatrist who was also concerned that he was overusing lorazepam  which he states he uses for sleep.   Alcohol Problem       Home Medications Prior to Admission medications   Medication Sig Start Date End Date Taking? Authorizing Provider  tamsulosin (FLOMAX) 0.4 MG CAPS capsule Take 0.4 mg by mouth at bedtime. 12/10/23  Yes [provider]  ascorbic acid (VITAMIN C) 500 MG tablet Take 500 mg by mouth daily.    [provider]  Bempedoic Acid-Ezetimibe  (NEXLIZET ) 180-10 MG TABS Take 1 tablet by mouth in the morning. 11/15/23   Tolia, Sunit, DO  carvedilol  (COREG ) 3.125 MG tablet Take 1 tablet (3.125 mg total) by mouth 2 (two) times daily with a meal. 11/09/23   Weaver, Scott T, PA-C  fluticasone  (FLONASE ) 50 MCG/ACT nasal spray Place 1 spray into both nostrils daily.     [provider]  hydrOXYzine  (ATARAX ) 10 MG tablet TAKE 2 TABLETS BY MOUTH THREE TIMES DAILY AS NEEDED FOR ANXIETY 08/10/23   Cottle, Kennedy Peabody., MD  Omega-3 Fatty Acids (FISH OIL PO) Take 1 capsule by mouth daily.    [provider]  thiamine  100 MG tablet Take 1 tablet (100 mg total) by mouth daily. 10/31/20   Sheikh, Omair Latif, DO  Zinc 50 MG TABS Take 50 mg by mouth daily.    [provider]      Allergies    Simvastatin,  Lorazepam , and Trazodone and nefazodone    Review of Systems   See HPI  Physical Exam Updated Vital Signs BP (!) 145/86 (BP Location: Right Arm)   Pulse 83   Temp 98.3 F (36.8 C) (Oral)   Resp 18   Ht 6\' 2"  (1.88 m)   Wt 86.2 kg   SpO2 96%   BMI 24.39 kg/m  Physical Exam Vitals and nursing note reviewed.  HENT:     Head: Normocephalic and atraumatic.     Mouth/Throat:     Mouth: Mucous membranes are moist.  Eyes:     General:        Right eye: No discharge.        Left eye: No discharge.     Conjunctiva/sclera: Conjunctivae normal.  Cardiovascular:     Rate and Rhythm: Normal rate and regular rhythm.     Pulses: Normal pulses.     Heart sounds: Normal heart sounds.  Pulmonary:     Effort: Pulmonary effort is normal.     Breath sounds: Normal breath sounds.  Abdominal:     General: Abdomen is flat.     Palpations: Abdomen is soft.  Skin:    General: Skin is warm and dry.  Neurological:     General: No focal deficit present.  Psychiatric:  Mood and Affect: Mood normal.     ED Results / Procedures / Treatments   Labs (all labs ordered are listed, but only abnormal results are displayed) Labs Reviewed  CBC WITH DIFFERENTIAL/PLATELET - Abnormal; Notable for the following components:      Result Value   MCV 100.5 (*)    Monocytes Absolute 1.3 (*)    All other components within normal limits  ETHANOL - Abnormal; Notable for the following components:   Alcohol, Ethyl (B) 141 (*)    All other components within normal limits  COMPREHENSIVE METABOLIC PANEL WITH GFR - Abnormal; Notable for the following components:   Sodium 125 (*)    Chloride 93 (*)    CO2 16 (*)    Glucose, Bld 148 (*)    Calcium  8.4 (*)    AST 52 (*)    Total Bilirubin 1.9 (*)    Anion gap 16 (*)    All other components within normal limits  ACETAMINOPHEN  LEVEL - Abnormal; Notable for the following components:   Acetaminophen  (Tylenol ), Serum <10 (*)    All other components within  normal limits  SALICYLATE LEVEL - Abnormal; Notable for the following components:   Salicylate Lvl <7.0 (*)    All other components within normal limits  RAPID URINE DRUG SCREEN, HOSP PERFORMED - Abnormal; Notable for the following components:   Benzodiazepines POSITIVE (*)    All other components within normal limits  COMPREHENSIVE METABOLIC PANEL WITH GFR - Abnormal; Notable for the following components:   Sodium 132 (*)    CO2 19 (*)    Glucose, Bld 112 (*)    Calcium  7.5 (*)    Total Bilirubin 1.5 (*)    All other components within normal limits    EKG EKG Interpretation Date/Time:  Friday Dec 21 2023 07:24:34 EDT Ventricular Rate:  87 PR Interval:  164 QRS Duration:  99 QT Interval:  382 QTC Calculation: 460 R Axis:   -72  Text Interpretation: Sinus rhythm Left anterior fascicular block Probable anterior infarct, age indeterminate Confirmed by Angela Kell [phone]) on 12/21/2023 8:31:06 AM  Radiology No results found.  Procedures Procedures    Medications Ordered in ED Medications  LORazepam  (ATIVAN ) injection 0-4 mg ( Intravenous See Alternative 12/21/23 0901)    Or  LORazepam  (ATIVAN ) tablet 0-4 mg (1 mg Oral Given 12/21/23 0901)  LORazepam  (ATIVAN ) injection 0-4 mg (has no administration in time range)    Or  LORazepam  (ATIVAN ) tablet 0-4 mg (has no administration in time range)  thiamine  (VITAMIN B1) tablet 100 mg (100 mg Oral Given 12/21/23 0901)    Or  thiamine  (VITAMIN B1) injection 100 mg ( Intravenous See Alternative 12/21/23 0901)  escitalopram (LEXAPRO) tablet 5 mg (has no administration in time range)  acetaminophen  (TYLENOL ) tablet 650 mg (has no administration in time range)  alum & mag hydroxide-simeth (MAALOX/MYLANTA) 200-200-20 MG/5ML suspension 30 mL (has no administration in time range)  magnesium hydroxide (MILK OF MAGNESIA) suspension 30 mL (has no administration in time range)  OLANZapine zydis (ZYPREXA) disintegrating tablet 5 mg (has no  administration in time range)  OLANZapine (ZYPREXA) injection 5 mg (has no administration in time range)  hydrOXYzine  (ATARAX ) tablet 25 mg (has no administration in time range)  sodium chloride  0.9 % bolus 1,000 mL (0 mLs Intravenous Stopped 12/21/23 0924)  sodium chloride  0.9 % bolus 1,000 mL (0 mLs Intravenous Stopped 12/21/23 1215)    ED Course/ Medical Decision Making/ A&P  Medical Decision Making Amount and/or Complexity of Data Reviewed Labs: ordered.  Risk OTC drugs. Prescription drug management.   Initial Impression and Ddx 78 year old well-appearing male presenting for suicidal ideation, alcohol and lorazepam  overuse concern.  Exam was unremarkable and he did not appear to be actively in withdrawal or intoxicated.  DDx includes alcohol withdrawal, delirium tremens, electrolyte derangement, suicidal ideation, self-harm, acute hepatitis, other. Patient PMH that increases complexity of ED encounter:   history of asthma, anxiety, depression alcohol use  Interpretation of Diagnostics - I independent reviewed and interpreted the labs as followed: hyponatremia, anion gap  -I personally reviewed and interpreted EKG which revealed sinus rhythm  Patient Reassessment and Ultimate Disposition/Management Initial labs indicating hyponatremia, likely related to his alcohol consumption.  Treated with fluids and second CMP revealed normal sodium and normal anion gap.  Did receive 1 mg of p.o. Ativan  per CIWA protocol. Was evaluated by TTS who advised admission.  Placed in psych hold.  Thus far has been appropriate, noncombative and cooperative with staff.  IVC not indicated at this time.  Patient management required discussion with the following services or consulting groups:  TTS  Complexity of Problems Addressed Acute complicated illness or Injury  Additional Data Reviewed and Analyzed Further history obtained from: Further history from spouse/family  member, Past medical history and medications listed in the EMR, and Prior ED visit notes  Patient Encounter Risk Assessment Consideration of hospitalization]         Final Clinical Impression(s) / ED Diagnoses Final diagnoses:  Hyponatremia  Suicidal ideations    Rx / DC Orders ED Discharge Orders     None         Janalee Mcmurray, PA-C 12/21/23 1403    Burnette Carte, MD 12/21/23 1420

## 2023-12-21 NOTE — Progress Notes (Signed)
 Patient has been accepted to [facility] on 12/21/2023   [place] Gus Littler LCSW-A   12/21/2023 3:43 PM

## 2023-12-21 NOTE — ED Notes (Signed)
Patient cannot urinate at this time.

## 2023-12-21 NOTE — ED Provider Notes (Addendum)
 Behavioral Health Urgent Care Medical Screening Exam  Patient Name: Eduardo Watts. MRN: [id number] Date of Evaluation: 12/21/23 Chief Complaint:  Patient transferred to the [facility] from [facility] for alcohol and benzodiazepine detox Diagnosis:  Final diagnoses:  Moderate episode of recurrent major depressive disorder (HCC)  Anxiety  Benzodiazepine abuse (HCC)  Alcohol abuse    History of Present illness: Eduardo Bland. is a 78 y.o. male with a past psychiatric history significant for MDD, GAD, insomnia and a documented medical history of prediabetes, GERD, BPH who was transferred from the [facility] emergency department to the [facility] for alcohol and benzodiazepine (lorazepam ) detox.  On evaluation, patient is alert and oriented to person, place, time and situation. His thought process is linear. Thought content is negative for SI/HI/AVH. Objectively, there is no evidence that the patient is currently responding to internal or external stimuli, or experiencing delusional or paranoia thought content. His mood is anxious/depressed and affect is congruent. He has fair eye contact. He is casually dressed. He is cooperative and does not appear to be in acute distress.  Patient states that he is here to get off alcohol and lorazepam . He states that the main issue is that he is unable to sleep. He states that he is prescribed lorazepam  1 mg tablet a day. However, he takes lorazepam  1 tablet around 8 PM at bedtime and then takes 1/2 to whole tablet around 3 AM when he wakes up to help with sleep. He states that he has tried melatonin and other sleep aid medications but nothing helps. He often will drink a glass of wine and take Ativan  at night to help with sleep and the most he sleeps is about 3 hours.   He identifies current stressors as his doctor Levert Ready telling him last month that he wanted to half his medication and he did not like it, wife's family, sister  who is an Charity fundraiser makes him nervous and does not understand, daughter left her husband and he barely sees his grandchild.  He states that he is anxious all the time, shaking, palpitations and constantly worrying about everything. He reports feeling depressed, mostly hopeless. He scored 13 on the PHQ-9. He denies suicidal thoughts but states 2 weeks ago he had a passing thought but nothing sincere. He denies past suicide attempts. He reports that his brother committed suicide in 2007 and put a shotgun to his chest.  He reports that he has been drinking a bottle of wine a day for the past 5 years. He last consumed alcohol yesterday. He reports withdrawal symptoms of anxiety. He states that he stopped drinking alcohol for 6 months on his own a year and 6 months ago. He did not experience alcohol withdrawal symptoms at that time. No history of alcohol withdrawal seizures or delirium tremens. No past substance abuse treatment.  He reports that he has been taking lorazepam  since 2021. He denies taking other prescribed medications. He was started on Lexapro 5 mg daily for depression/anxiety while the [facility] emergency department.   He reports a medical history of prediabetes noncomplicated and neuropathy in his feet. He asked the nursing staff for a wheelchair as he occasionally loses his balance and feels weak. He reports a fall 6 months ago after he lost his balance. He reports that he uses a shower chair to bathe because of lower extremity weakness. He is noted to ambulate slowly and will hold on to the wall periodically. He denies  using an assistive device at home to ambulate.   Patient is recommended to be transferred to [facility] Gero unit for psychiatric treatment due to limited mobility. The Gero unit is more equipped with the appropriate resources to accommodate the patient's needs and reduce risk for falls (call bell, wheelchair, walker, access to PT and 1:1 if required).   Flowsheet Row ED from 12/21/2023  in [facility] Most recent reading at 12/21/2023  4:01 PM ED from 12/21/2023 in [facility] Emergency Department at [facility] Most recent reading at 12/21/2023  6:42 AM Admission (Discharged) from 08/17/2022 in [place] LONG 4TH FLOOR PROGRESSIVE CARE AND UROLOGY Most recent reading at 08/17/2022  4:00 PM  C-SSRS RISK CATEGORY Low Risk High Risk No Risk       Psychiatric Specialty Exam  Presentation  General Appearance:Appropriate for Environment  Eye Contact:Fair  Speech:Clear and Coherent  Speech Volume:Normal  Handedness:No data recorded  Mood and Affect  Mood: Anxious  Affect: Congruent   Thought Process  Thought Processes: Coherent  Descriptions of Associations:Intact  Orientation:Full (Time, Place and Person)  Thought Content:Logical    Hallucinations:None  Ideas of Reference:None  Suicidal Thoughts:No  Homicidal Thoughts:No   Sensorium  Memory: Immediate Fair; Recent Fair; Remote Fair  Judgment: Intact  Insight: Present   Executive Functions  Concentration: Fair  Attention Span: Fair  Recall: [employer] of Knowledge: Fair  Language: Fair   Psychomotor Activity  Psychomotor Activity: Decreased; Other (comment) (slow gait)   Assets  Assets: Communication Skills; Desire for Improvement; Housing   Sleep  Sleep: Poor  Number of hours:  3   Physical Exam: Physical Exam Cardiovascular:     Rate and Rhythm: Normal rate.  Pulmonary:     Effort: Pulmonary effort is normal.  Musculoskeletal:        General: Normal range of motion.     Cervical back: Normal range of motion.  Neurological:     Mental Status: He is alert.    Review of Systems  Eyes: Negative.   Respiratory: Negative.    Cardiovascular: Negative.   Gastrointestinal: Negative.   Genitourinary: Negative.   Psychiatric/Behavioral:  Positive for substance abuse. The patient is nervous/anxious and has insomnia.     Blood pressure (!) 165/93, pulse 60, temperature 98.6 F (37 C), temperature source Oral, resp. rate 18, SpO2 95%. There is no height or weight on file to calculate BMI.  Musculoskeletal: Strength & Muscle Tone: within normal limits Gait & Station: unsteady Patient leans: N/A   [facility] MSE Discharge Disposition for Follow up and Recommendations: Based on my evaluation I certify that psychiatric inpatient services furnished can reasonably be expected to improve the patient's condition which I recommend transfer to an appropriate accepting facility.  Case and treatment plan discussed with Dr. Genita Keys.   Patient accepted to [facility] this evening, 12/21/23. Admission orders placed for [facility].  Medication regimen Continue Lexapro 5 mg daily  Initiate mirtazapine 7 mg po at bedtime for sleep, depression and anxiety. Risk and benefits discussed with the patient.  Add librium 25 mg taper for alcohol and benzo withdrawal.  CIWA for alcohol withdrawal   Gwenlyn Lento, NP 12/21/2023, 5:37 PM

## 2023-12-21 NOTE — ED Notes (Signed)
 Patient transferred from [facility] to [facility] due to substance abuse. Calm, cooperative throughout interview process. Skin assessment completed. Oriented to unit. Meal and drink offered. Patient appears preoccupied with pillows, temperature of the room, and raising the head of the bed - asking repeatedly and appears to not remember asking previously. Patient was reported to be self ambulating at [facility] with no complications, is unsteady on his feet upon writer's assessment. Staff offered walker to prevent falls, and patient refused. Providers Nickola Baron, NP and Floyce Hutching, MD made aware. Patient denies intent to harm self or others when asked. Denies A/VH. Patient denies any physical complaints when asked. Patient reports difficulty sleeping and requests medication to assist with this later tonight. No acute distress noted. Support and encouragement provided. Routine safety checks conducted per facility protocol. Encouraged patient to notify staff if any thoughts of harm towards self or others arise. Patient verbalizes understanding and agreement.

## 2023-12-21 NOTE — ED Notes (Signed)
 [facility] spoke with pts wife, Kaynon Polczynski who accompanied pt to the [facility] ED for collateral information. Per pts wife, pt has "not been the same" for about 20 years. Pts wife reports that pt has abusing alcohol and Lorazepam  causing pt to pass out in the yard where he has has been discovered by neighbors several times. Pts wife also reports that pt drives while under the influence.  Pts wife reports a family history of mental illness with pts brother and 4 first cousins having committed suicide. Pt daughter suffers from depression (currently taking Lexapro) and has previously been treated for an oxycodone  addiction. Pt tried to get into [facility] but was denied to due to his insurance.   Patt Boozer, [facility]  12/21/23

## 2023-12-21 NOTE — Discharge Instructions (Signed)
 Transfer to [facility] 5/16

## 2023-12-21 NOTE — ED Notes (Signed)
 Pt is in the bedroom calm and composed. Respirations even and unlabored.  Environment secured per policy. NAD Will monitor for safety.

## 2023-12-21 NOTE — ED Notes (Addendum)
 [facility] here to pick pt. Pt accompanied by [facility]. Assessments remained unchanged. No belongings in locker as Pt's spouse took them home already.

## 2023-12-21 NOTE — ED Triage Notes (Signed)
 Pts wife reports that pts psychiatrist requested pt come in for addiction to lorazepam  and alcohol. Pt reports he drinks about a bottle of wine every day. Pt tearful and anxious in triage. Pt endorses SI, stating "I have a gun".

## 2023-12-22 ENCOUNTER — Encounter: Payer: Self-pay | Admitting: Behavioral Health

## 2023-12-22 DIAGNOSIS — F329 Major depressive disorder, single episode, unspecified: Secondary | ICD-10-CM | POA: Diagnosis not present

## 2023-12-22 MED ORDER — GABAPENTIN 100 MG PO CAPS
200.0000 mg | ORAL_CAPSULE | Freq: Two times a day (BID) | ORAL | Status: DC
  Administered 2023-12-23 – 2023-12-27 (×9): 200 mg via ORAL
  Filled 2023-12-22 (×9): qty 2

## 2023-12-22 NOTE — Progress Notes (Signed)
 78 yr male who presents voluntary in no acute distress for the treatment of ETOH abuse and Depression. Pt appears eager to start treatment, yet depressed. Pt was calm and cooperative with admission process. Pt denies SI/HI/AVH and pain. Skin was assessed and found to be clear of any abnormal marks apart from bumps on back and dryness. PT searched and no contraband found, POC and unit policies explained and understanding verbalized. Consents obtained. Food and fluids offered, and fluids accepted. Pt had no additional questions or concerns.

## 2023-12-22 NOTE — Tx Team (Signed)
 Initial Treatment Plan 12/22/2023 1:31 AM Eduardo Watts. ZOX:[id number]    PATIENT STRESSORS: Substance abuse     PATIENT STRENGTHS: Ability for insight  Active sense of humor  Communication skills  General fund of knowledge  Motivation for treatment/growth  Supportive family/friends    PATIENT IDENTIFIED PROBLEMS: Etoh abuse  Insomnia  anxiety                 DISCHARGE CRITERIA:  Motivation to continue treatment in a less acute level of care Withdrawal symptoms are absent or subacute and managed without 24-hour nursing intervention  PRELIMINARY DISCHARGE PLAN: Attend aftercare/continuing care group Outpatient therapy Participate in family therapy  PATIENT/FAMILY INVOLVEMENT: This treatment plan has been presented to and reviewed with the patient, Eduardo D Kaneko Jr..The patient has been given the opportunity to ask questions and make suggestions.  Mahi Zabriskie L Cristan Scherzer, RN 12/22/2023, 1:31 AM

## 2023-12-22 NOTE — Plan of Care (Signed)
  Problem: Physical Regulation: Goal: Complications related to the disease process, condition or treatment will be avoided or minimized Outcome: Progressing   Problem: Safety: Goal: Ability to remain free from injury will improve Outcome: Progressing

## 2023-12-22 NOTE — BHH Suicide Risk Assessment (Signed)
 [facility] Admission Suicide Risk Assessment   Nursing information obtained from:    Demographic factors:  Male, Age 78 or older, Caucasian Current Mental Status:  NA Loss Factors:  NA Historical Factors:  NA Risk Reduction Factors:  Living with another person, especially a relative  Total Time spent with patient: 30 minutes Principal Problem: MDD (major depressive disorder) Diagnosis:  Principal Problem:   MDD (major depressive disorder)  Subjective Data: Eduardo Watts. is a 78 y.o. male with a past psychiatric history significant for MDD, GAD, insomnia and a documented medical history of prediabetes, GERD, BPH who was transferred [facility] the [facility] emergency department to the [facility] for alcohol and benzodiazepine (lorazepam ) detox   Continued Clinical Symptoms:  Alcohol Use Disorder Identification Test Final Score (AUDIT): 23 The "Alcohol Use Disorders Identification Test", Guidelines for Use in Primary Care, Second Edition.  World Science writer [facility]). Score between 0-7:  no or low risk or alcohol related problems. Score between 8-15:  moderate risk of alcohol related problems. Score between 16-19:  high risk of alcohol related problems. Score 20 or above:  warrants further diagnostic evaluation for alcohol dependence and treatment.   CLINICAL FACTORS:   Severe Anxiety and/or Agitation   Musculoskeletal: Strength & Muscle Tone: within normal limits Gait & Station: normal Patient leans: N/A  Psychiatric Specialty Exam:  Presentation  General Appearance:  Appropriate for Environment; Casual  Eye Contact: Fair  Speech: Clear and Coherent  Speech Volume: Normal  Handedness: Right   Mood and Affect  Mood: Anxious  Affect: Appropriate   Thought Process  Thought Processes: Coherent  Descriptions of Associations:Intact  Orientation:Full (Time, Place and Person)  Thought Content:Logical  History of  Schizophrenia/Schizoaffective disorder:No data recorded Duration of Psychotic Symptoms:No data recorded Hallucinations:Hallucinations: None  Ideas of Reference:None  Suicidal Thoughts:Suicidal Thoughts: No  Homicidal Thoughts:Homicidal Thoughts: No   Sensorium  Memory: Immediate Fair; Recent Fair; Remote Fair  Judgment: Impaired  Insight: Shallow   Executive Functions  Concentration: Poor  Attention Span: Poor  Recall: Poor  Fund of Knowledge: Fair  Language: Fair   Psychomotor Activity  Psychomotor Activity: Psychomotor Activity: Normal   Assets  Assets: Communication Skills; Social Support; Resilience   Sleep  Sleep: Sleep: Fair Number of Hours of Sleep: 3    Physical Exam: Physical Exam ROS Blood pressure (!) 153/73, pulse (!) 105, temperature 99.2 F (37.3 C), temperature source Oral, resp. rate 18, height 6\' 2"  (1.88 m), weight 86.4 kg, SpO2 97%. Body mass index is 24.46 kg/m.   COGNITIVE FEATURES THAT CONTRIBUTE TO RISK:  None    SUICIDE RISK:   Minimal: No identifiable suicidal ideation.  Patients presenting with no risk factors but with morbid ruminations; may be classified as minimal risk based on the severity of the depressive symptoms  PLAN OF CARE: Patient is admitted to [facility] psych unit with Q15 min safety monitoring. Multidisciplinary team approach is offered. Medication management; group/milieu therapy is offered.   I certify that inpatient services furnished can reasonably be expected to improve the patient's condition.   Aurelia Blotter, MD 12/22/2023, 8:47 PM

## 2023-12-22 NOTE — Group Note (Signed)
 Date:  12/22/2023 Time:  10:18 AM  Group Topic/Focus:  Unit expectations and housekeeping goals.    Participation Level:  Did Not Attend   Merton Abts 12/22/2023, 10:18 AM

## 2023-12-22 NOTE — Progress Notes (Signed)
   12/21/23 2316  Psych Admission Type (Psych Patients Only)  Admission Status Voluntary  Psychosocial Assessment  Patient Complaints Substance abuse (ETOH)  Eye Contact Fair  Facial Expression Flat  Affect Appropriate to circumstance  Speech Logical/coherent  Interaction Minimal  Motor Activity Unsteady  Appearance/Hygiene In scrubs  Behavior Characteristics Cooperative;Calm  Mood Pleasant  Aggressive Behavior  Effect No apparent injury  Thought Process  Coherency WDL  Content WDL  Delusions WDL  Perception WDL  Hallucination None reported or observed  Judgment WDL  Confusion WDL  Danger to Self  Current suicidal ideation? Denies  Agreement Not to Harm Self Yes  Description of Agreement Verbalize  Danger to Others  Danger to Others None reported or observed

## 2023-12-22 NOTE — Plan of Care (Signed)
  Problem: Education: Goal: Knowledge of disease or condition will improve 12/22/2023 0111 by Troi Bechtold L, RN Outcome: Not Progressing 12/22/2023 0110 by Jaser Fullen L, RN Outcome: Not Progressing Goal: Understanding of discharge needs will improve 12/22/2023 0111 by Ignacio Mako, RN Outcome: Not Progressing 12/22/2023 0110 by Ignacio Mako, RN Outcome: Not Progressing   Problem: Health Behavior/Discharge Planning: Goal: Ability to identify changes in lifestyle to reduce recurrence of condition will improve 12/22/2023 0111 by Diyari Cherne L, RN Outcome: Not Progressing 12/22/2023 0110 by Ignacio Mako, RN Outcome: Not Progressing   Problem: Physical Regulation: Goal: Complications related to the disease process, condition or treatment will be avoided or minimized 12/22/2023 0111 by Nile Dorning L, RN Outcome: Not Progressing 12/22/2023 0110 by Jettson Crable L, RN Outcome: Not Progressing   Problem: Safety: Goal: Ability to remain free from injury will improve 12/22/2023 0111 by Kensi Karr L, RN Outcome: Not Progressing 12/22/2023 0110 by Ignacio Mako, RN Outcome: Not Progressing

## 2023-12-22 NOTE — Progress Notes (Signed)
   12/22/23 1000  Psych Admission Type (Psych Patients Only)  Admission Status Voluntary  Psychosocial Assessment  Patient Complaints Anxiety  Eye Contact Fair  Facial Expression Flat  Affect Appropriate to circumstance  Speech Logical/coherent  Interaction Assertive  Motor Activity Unsteady  Appearance/Hygiene In scrubs  Behavior Characteristics Cooperative  Mood Pleasant;Depressed  Thought Process  Coherency WDL  Content WDL  Delusions WDL  Perception WDL  Hallucination None reported or observed  Judgment WDL  Confusion WDL  Danger to Self  Current suicidal ideation? Denies  Agreement Not to Harm Self Yes  Description of Agreement verbal  Danger to Others  Danger to Others Reported or observed

## 2023-12-22 NOTE — H&P (Signed)
 Psychiatric Admission Assessment Adult  Patient Identification: Eduardo Watts. MRN:  [id number] Date of Evaluation:  12/22/2023 Chief Complaint:  MDD (major depressive disorder) [F32.9]  History of Present Illness:  Eduardo Watts. is a 78 y.o. male with a past psychiatric history significant for MDD, GAD, insomnia and a documented medical history of prediabetes, GERD, BPH who was transferred from the [facility] emergency department to the [facility] for alcohol and benzodiazepine (lorazepam ) detox Patient is admitted to [facility] psych unit with Q15 min safety monitoring. Multidisciplinary team approach is offered. Medication management; group/milieu therapy is offered.   On interview patient reports that he has been on lorazepam  for some years and he reportedly takes it at night to help him sleep and another dose at 3 AM when he wakes up to go back to sleep again.  Patient reports that his outpatient provider sent him to the emergency room for detoxing from lorazepam  and alcohol as he was informed by his primary care that he will be reducing the dose and weaning him off of the Ativan  eventually.  Patient minimizes the use of alcohol stating he drinks only wine.  He did acknowledge buying 1 bottle of wine every day but reports that he does not finish the whole bottle in a day.  He did endorse having withdrawal symptoms of shakes when he does not drink.  He denies any history of DTs or seizures coming off of alcohol.  He denies feeling depressed denies hopelessness or worthlessness.  He reports poor sleep and fair appetite, reports fair energy and motivation to do things.  He reports living with his wife and reportedly they hang out together.  He reports worsening anxiety and panic attacks.  He remains very apprehensive about coming off of lorazepam  and states that he is not happy about the decision.  He denies auditory/visual hallucinations.  He denies any recent  or previous episodes of mania/hypomania.  He denies auditory/visual hallucinations and no overt delusions noted.  He denies any history of abuse and denies ongoing nightmares or flashbacks.  Wife Darelle Kings [id number] to generic voicemail  Total Time spent with patient: 1 hour Sleep  Sleep:Sleep: Fair Number of Hours of Sleep: 3  Past Psychiatric History:  Psychiatric History:  Information collected from patient/chart  Prev Dx/Sx: anxiety/depression Current Psych Provider: [facility]  Home Meds (current): Atarax , Ativan  Previous Med Trials: yes Therapy: Denies    Prior Psych Hospitalization: Denies   Prior Self Harm: Denies  Prior Violence: Denies    Family Psych History: Yes Family Hx suicide: Yes   Social History:  Developmental Hx: deferred Educational Hx: Patient graduated high school  Occupational Hx: Sports administrator Hx: Denies  Living Situation: Lives at home with wife Spiritual Hx: Yes Access to weapons/lethal means: Yes    Substance History Alcohol:  Yes Type of alcohol wine   Last Drink yesterday Number of drinks per day a bottle of wine daily  History of alcohol withdrawal seizures Denies  History of DT's Denies  Tobacco: Denies  Illicit drugs: Denies  Prescription drug abuse: Yes Ativan   Rehab hx: Denies  Is the patient at risk to self? No.  Has the patient been a risk to self in the past 6 months? No.  Has the patient been a risk to self within the distant past? No.  Is the patient a risk to others? No.  Has the patient been a risk to others in the past 6 months?  No.  Has the patient been a risk to others within the distant past? No.   [place] Scale:  Flowsheet Row Admission (Current) from 12/21/2023 in [facility] BEHAVIORAL MEDICINE Most recent reading at 12/21/2023 11:16 PM ED from 12/21/2023 in [facility] Most recent reading at 12/21/2023  4:01 PM ED from 12/21/2023 in [facility] Emergency  Department at [facility] Most recent reading at 12/21/2023  6:42 AM  C-SSRS RISK CATEGORY No Risk Low Risk High Risk        Past Medical History:  Past Medical History:  Diagnosis Date   Anxiety    Asthma    as a child   Depression    sees Dr Toi Foster   History of BPH    Dr.Dahlstedt   Hyperlipidemia    Pre-diabetes    Prediabetes    Sleep disturbances     Past Surgical History:  Procedure Laterality Date   CIRCUMCISION     LEFT HEART CATHETERIZATION WITH CORONARY ANGIOGRAM N/A 04/30/2014   Procedure: LEFT HEART CATHETERIZATION WITH CORONARY ANGIOGRAM;  Surgeon: Lucendia Rusk, MD;  Location: [facility] CATH LAB;  Service: Cardiovascular;  Laterality: N/A;   NASAL SEPTUM SURGERY  1975   R cheek benign tumor      1992, again 1998.   TONSILLECTOMY     TRANSURETHRAL RESECTION OF PROSTATE N/A 08/17/2022   Procedure: TRANSURETHRAL RESECTION OF THE PROSTATE (TURP);  Surgeon: Trent Frizzle, MD;  Location: [facility] ORS;  Service: Urology;  Laterality: N/A;  90 MINS   Family History:  Family History  Problem Relation Age of Onset   Dementia Mother    Stroke Mother    Cancer - Prostate Father    Melanoma Father    Hyperlipidemia Sister        high cholestrol   Depression Brother    Suicidality Brother    Lung disease Maternal Grandfather    Heart attack Neg Hx    Hypertension Neg Hx     Social History:  Social History   Substance and Sexual Activity  Alcohol Use Yes   Alcohol/week: 14.0 - 21.0 standard drinks of alcohol   Types: 14 - 21 Glasses of wine per week   Comment: bottle of wine a day     Social History   Substance and Sexual Activity  Drug Use No      Allergies:   Allergies  Allergen Reactions   Simvastatin Other (See Comments)    Muscle aches   Lorazepam  Other (See Comments)    Dizzy/unsteady   Trazodone And Nefazodone Other (See Comments)    "crazy"   Lab Results:  Results for orders placed or performed during the hospital encounter of  12/21/23 (from the past 48 hours)  SARS Coronavirus 2 by RT PCR (hospital order, performed in [facility] hospital lab) *cepheid single result test* Anterior Nasal Swab     Status: None   Collection Time: 12/21/23  6:29 PM   Specimen: Anterior Nasal Swab  Result Value Ref Range   SARS Coronavirus 2 by RT PCR NEGATIVE NEGATIVE    Comment: Performed at [facility], [address] [place], [place] [address]    Blood Alcohol level:  Lab Results  Component Value Date   ETH 141 (H) 12/21/2023   ETH 214 (H) 10/29/2020    Metabolic Disorder Labs:  Lab Results  Component Value Date   HGBA1C 6.1 (H) 08/09/2022   MPG 128 08/09/2022   MPG 145.59 10/31/2020  No results found for: "PROLACTIN" Lab Results  Component Value Date   CHOL 269 (H) 11/09/2023   TRIG 105 11/09/2023   HDL 96 11/09/2023   CHOLHDL 2.8 11/09/2023   LDLCALC 155 (H) 11/09/2023   LDLCALC 139 (H) 05/24/2017    Current Medications: Current Facility-Administered Medications  Medication Dose Route Frequency Provider Last Rate Last Admin   acetaminophen  (TYLENOL ) tablet 650 mg  650 mg Oral Q6H PRN White, Patrice L, NP       alum & mag hydroxide-simeth (MAALOX/MYLANTA) 200-200-20 MG/5ML suspension 30 mL  30 mL Oral Q4H PRN White, Patrice L, NP       chlordiazePOXIDE  (LIBRIUM ) capsule 25 mg  25 mg Oral Q6H PRN White, Patrice L, NP       chlordiazePOXIDE  (LIBRIUM ) capsule 25 mg  25 mg Oral QID White, Patrice L, NP   25 mg at 12/22/23 1728   Followed by   Cecily Cohen ON 12/23/2023] chlordiazePOXIDE  (LIBRIUM ) capsule 25 mg  25 mg Oral TID White, Patrice L, NP       Followed by   Cecily Cohen ON 12/24/2023] chlordiazePOXIDE  (LIBRIUM ) capsule 25 mg  25 mg Oral BH-qamhs White, Patrice L, NP       Followed by   Cecily Cohen ON 12/25/2023] chlordiazePOXIDE  (LIBRIUM ) capsule 25 mg  25 mg Oral Daily White, Patrice L, NP       escitalopram  (LEXAPRO ) tablet 5 mg  5 mg Oral Daily White, Patrice L, NP   5 mg at 12/22/23 0916   fluticasone   (FLONASE ) 50 MCG/ACT nasal spray 1 spray  1 spray Each Nare Daily PRN Nickola Baron L, NP   1 spray at 12/22/23 0838   [START ON 12/23/2023] gabapentin  (NEURONTIN ) capsule 200 mg  200 mg Oral BID Krystofer Hevener, MD       hydrOXYzine  (ATARAX ) tablet 25 mg  25 mg Oral Q6H PRN White, Patrice L, NP       hydrOXYzine  (ATARAX ) tablet 25 mg  25 mg Oral TID PRN White, Patrice L, NP   25 mg at 12/22/23 1728   loperamide  (IMODIUM ) capsule 2-4 mg  2-4 mg Oral PRN White, Patrice L, NP       magnesium  hydroxide (MILK OF MAGNESIA) suspension 30 mL  30 mL Oral Daily PRN White, Patrice L, NP       multivitamin with minerals tablet 1 tablet  1 tablet Oral Daily White, Patrice L, NP   1 tablet at 12/22/23 1610   OLANZapine  (ZYPREXA ) injection 5 mg  5 mg Intramuscular TID PRN White, Patrice L, NP       OLANZapine  zydis (ZYPREXA ) disintegrating tablet 5 mg  5 mg Oral TID PRN White, Patrice L, NP       ondansetron  (ZOFRAN -ODT) disintegrating tablet 4 mg  4 mg Oral Q6H PRN White, Patrice L, NP       thiamine  (VITAMIN B1) tablet 100 mg  100 mg Oral Daily White, Patrice L, NP   100 mg at 12/22/23 0916   PTA Medications: Medications Prior to Admission  Medication Sig Dispense Refill Last Dose/Taking   fluticasone  (FLONASE ) 50 MCG/ACT nasal spray Place 1 spray into both nostrils daily.        Psychiatric Specialty Exam:  Presentation  General Appearance:  Appropriate for Environment; Casual  Eye Contact: Fair  Speech: Clear and Coherent  Speech Volume: Normal    Mood and Affect  Mood: Anxious  Affect: Appropriate   Thought Process  Thought Processes: Coherent  Descriptions of Associations:Intact  Orientation:Full (  Time, Place and Person)  Thought Content:Logical  Hallucinations:Hallucinations: None  Ideas of Reference:None  Suicidal Thoughts:Suicidal Thoughts: No  Homicidal Thoughts:Homicidal Thoughts: No   Sensorium  Memory: Immediate Fair; Recent Fair; Remote  Fair  Judgment: Impaired  Insight: Shallow   Executive Functions  Concentration: Poor  Attention Span: Poor  Recall: Poor  Fund of Knowledge: Fair  Language: Fair   Psychomotor Activity  Psychomotor Activity: Psychomotor Activity: Normal   Assets  Assets: Communication Skills; Social Support; Resilience    Musculoskeletal: Strength & Muscle Tone: within normal limits Gait & Station: normal  Physical Exam: Physical Exam Vitals and nursing note reviewed.  HENT:     Head: Normocephalic.     Nose: Nose normal.     Mouth/Throat:     Mouth: Mucous membranes are moist.  Cardiovascular:     Pulses: Normal pulses.  Pulmonary:     Effort: Pulmonary effort is normal.  Abdominal:     Palpations: Abdomen is soft.  Skin:    General: Skin is warm.  Neurological:     Mental Status: He is alert.    Review of Systems  Constitutional: Negative.   HENT: Negative.    Eyes: Negative.   Cardiovascular: Negative.   Gastrointestinal: Negative.   Skin: Negative.    Blood pressure (!) 153/73, pulse (!) 105, temperature 99.2 F (37.3 C), temperature source Oral, resp. rate 18, height 6\' 2"  (1.88 m), weight 86.4 kg, SpO2 97%. Body mass index is 24.46 kg/m.  Principal Diagnosis: MDD (major depressive disorder) Diagnosis:  Principal Problem:   MDD (major depressive disorder)   Clinical Decision Making: Patient with history of anxiety, alcohol use disorder, chronic benzo use as prescribed is currently admitted to inpatient psychiatric unit for detoxing from alcohol and chronic benzos use.  Patient is currently tolerating Librium  taper fairly well.  Treatment Plan Summary:  Safety and Monitoring:             -- Voluntary admission to inpatient psychiatric unit for safety, stabilization and treatment             -- Daily contact with patient to assess and evaluate symptoms and progress in treatment             -- Patient's case to be discussed in multi-disciplinary  team meeting             -- Observation Level: q15 minute checks             -- Vital signs:  q12 hours             -- Precautions: suicide, elopement, and assault   2. Psychiatric Diagnoses and Treatment:               Librium  taper On Lexapro  5 mg-will plan to titrate it up for optimization of anxiety symptoms control Added gabapentin  200 mg twice daily to help with chronic alcoholism/benzo withdrawal upon discharge.   -- The risks/benefits/side-effects/alternatives to this medication were discussed in detail with the patient and time was given for questions. The patient consents to medication trial.                -- Metabolic profile and EKG monitoring obtained while on an atypical antipsychotic (BMI: Lipid Panel: HbgA1c: QTc:)              -- Encouraged patient to participate in unit milieu and in scheduled group therapies  3. Medical Issues Being Addressed:      4. Discharge Planning:              -- Social work and case management to assist with discharge planning and identification of hospital follow-up needs prior to discharge             -- Estimated LOS: 5-7 days             -- Discharge Concerns: Need to establish a safety plan; Medication compliance and effectiveness             -- Discharge Goals: Return home with outpatient referrals follow ups  Physician Treatment Plan for Primary Diagnosis: MDD (major depressive disorder) Long Term Goal(s): Improvement in symptoms so as ready for discharge  Short Term Goals: Ability to identify changes in lifestyle to reduce recurrence of condition will improve, Ability to verbalize feelings will improve, Ability to demonstrate self-control will improve, Compliance with prescribed medications will improve, and Ability to identify triggers associated with substance abuse/mental health issues will improve  Physician Treatment Plan for Secondary Diagnosis: Principal Problem:   MDD (major depressive  disorder)  Long Term Goal(s): Improvement in symptoms so as ready for discharge  Short Term Goals: Ability to identify changes in lifestyle to reduce recurrence of condition will improve, Ability to verbalize feelings will improve, Ability to disclose and discuss suicidal ideas, Ability to demonstrate self-control will improve, Ability to identify and develop effective coping behaviors will improve, Ability to maintain clinical measurements within normal limits will improve, Compliance with prescribed medications will improve, and Ability to identify triggers associated with substance abuse/mental health issues will improve  I certify that inpatient services furnished can reasonably be expected to improve the patient's condition.    Mahir Prabhakar, MD 5/17/20258:48 PM

## 2023-12-22 NOTE — BHH Counselor (Signed)
 Adult Comprehensive Assessment  Patient ID: Eduardo Plant., male   DOB: Jun 26, 1946, 78 y.o.   MRN: [id number]  Information Source: Information source: Patient  Current Stressors:  Patient states their primary concerns and needs for treatment are:: "to get off the lorazepam  and stop drinking wine" Patient states their goals for this hospitilization and ongoing recovery are:: "to find out what meds they put me on that was some really good sleep" Educational / Learning stressors: denies; reports having some graduate school Employment / Job issues: denies, reports that he is currently employed Family Relationships: denies; reports a good relationship with his wife, daughter and Doctor, general practice / Lack of resources (include bankruptcy): denies Housing / Lack of housing: has housing; lives with his wife Physical health (include injuries & life threatening diseases): denies Social relationships: denies Substance abuse: "I have got to stop with the wine" Bereavement / Loss: denies  Living/Environment/Situation:  Living Arrangements: Spouse/significant other Who else lives in the home?: wife How long has patient lived in current situation?: "many, many years" What is atmosphere in current home: Supportive  Family History:  Marital status: Married What is your sexual orientation?: heterosexual Does patient have children?: Yes How many children?: 1 How is patient's relationship with their children?: "good"  Childhood History:  Did patient suffer any verbal/emotional/physical/sexual abuse as a child?: No Did patient suffer from severe childhood neglect?: No Has patient ever been sexually abused/assaulted/raped as an adolescent or adult?: No Was the patient ever a victim of a crime or a disaster?: No Witnessed domestic violence?: No Has patient been affected by domestic violence as an adult?: No  Education:  Highest grade of school patient has completed: some graduate  school Currently a Consulting civil engineer?: No Learning disability?: No  Employment/Work Situation:   Employment Situation: Employed How Long has Patient Been Employed?: 30 years Work Stressors: denies What is the Longest Time Patient has Held a Job?: 30 years Has Patient ever Been in the [employer]?: No  Financial Resources:   Financial resources: Income from employment Does patient have a representative payee or guardian?: No  Alcohol/Substance Abuse:   What has been your use of drugs/alcohol within the last 12 months?: "I need to stop drinking wine and using lorazepam " Has alcohol/substance abuse ever caused legal problems?: No  Social Support System:   Conservation officer, nature Support System: Good Describe Community Support System: has psychiatrist Type of faith/religion: pentecostal  Leisure/Recreation:   Do You Have Hobbies?: Yes Leisure and Hobbies: "the usual stuff"  Strengths/Needs:   Patient states they can use these personal strengths during their treatment to contribute to their recovery: "stop drinking wine and using lorazepam " Patient states these barriers may affect/interfere with their treatment: "not getting good enough sleep" Patient states these barriers may affect their return to the community: none "when can I leave this place." Other important information patient would like considered in planning for their treatment: none  Discharge Plan:   Currently receiving community mental health services: Yes (From Whom) Patient states concerns and preferences for aftercare planning are: "I just need to know what meds I received last night so I can give it to my doctor, Eduardo Watts psychiatrist in [place]" Patient states they will know when they are safe and ready for discharge when: "My wife is coming to visit today, I can go with her then" Does patient have access to transportation?: Yes Does patient have financial barriers related to discharge medications?: No Patient description  of barriers related to discharge medications: denies  Will patient be returning to same living situation after discharge?: Yes  Summary/Recommendations:   Summary and Recommendations (to be completed by the evaluator): Eduardo Watts is a 78 yr old male that was admitted in the Geropsych unit on 12/21/23/ for suicidal ideations, stating that he has a gun.  He denies SI at this time. Patient prefers to be called "Eduardo Watts."  He has a history of drinking alcohol and misusing Lorazepam .  Patient has a family history of depression and committing suicide (brother and cousin).  He is currently married and reports that he works part time.  He stated that he wants to stop drinking alcohol and using the medication. He reports a trigger is not being able to sleep well throughout the night. He has a psychiatrist, Dr. Nori Watts, in [place].  While here, Eduardo Watts can benefit from crisis stabilization, medication management, therapeutic milieu, and referrals for services.   Eduardo Watts. 12/22/2023

## 2023-12-23 DIAGNOSIS — F329 Major depressive disorder, single episode, unspecified: Secondary | ICD-10-CM | POA: Diagnosis not present

## 2023-12-23 MED ORDER — FLUTICASONE PROPIONATE 50 MCG/ACT NA SUSP
1.0000 | Freq: Every day | NASAL | Status: DC
  Administered 2023-12-24 – 2023-12-27 (×4): 1 via NASAL
  Filled 2023-12-23: qty 16

## 2023-12-23 MED ORDER — NYSTATIN 100000 UNIT/GM EX POWD
Freq: Two times a day (BID) | CUTANEOUS | Status: DC | PRN
  Administered 2023-12-24 – 2023-12-25 (×2): 1 via TOPICAL
  Filled 2023-12-23: qty 15

## 2023-12-23 MED ORDER — NYSTATIN 100000 UNIT/GM EX OINT
TOPICAL_OINTMENT | Freq: Two times a day (BID) | CUTANEOUS | Status: DC
  Filled 2023-12-23: qty 15

## 2023-12-23 NOTE — Plan of Care (Signed)
 Patient alert and oriented x4. Denies SI, HI, AVH and pain. C/o anxiety and need to sleep, PRNs administered w/ scheduled medications per [facility]. CIWA-1. Support and encouragement provided.  Routine safety checks conducted every 15 minutes.  Patient informed to notify staff with problems or concerns. No adverse drug reactions noted. Patient verbally contracts for safety at this time. Patient interacts well with others on the unit.  Patient remains safe at this time.  Problem: Education: Goal: Knowledge of disease or condition will improve Outcome: Progressing Goal: Understanding of discharge needs will improve Outcome: Progressing   Problem: Health Behavior/Discharge Planning: Goal: Ability to identify changes in lifestyle to reduce recurrence of condition will improve Outcome: Progressing   Problem: Safety: Goal: Ability to remain free from injury will improve Outcome: Progressing   Problem: Coping: Goal: Ability to verbalize frustrations and anger appropriately will improve Outcome: Progressing Goal: Ability to demonstrate self-control will improve Outcome: Progressing

## 2023-12-23 NOTE — Group Note (Signed)
 Date:  12/23/2023 Time:  12:37 AM  Group Topic/Focus:  Wrap-Up Group:   The focus of this group is to help patients review their daily goal of treatment and discuss progress on daily workbooks.    Participation Level:  Minimal  Participation Quality:  Appropriate  Affect:  Appropriate  Cognitive:  Alert  Insight: Good  Engagement in Group:  Limited  Modes of Intervention:  Discussion  Additional Comments:     Maglione,Alajah Witman E 12/23/2023, 12:37 AM

## 2023-12-23 NOTE — Plan of Care (Signed)
  Problem: Health Behavior/Discharge Planning: Goal: Ability to identify changes in lifestyle to reduce recurrence of condition will improve Outcome: Progressing   Problem: Physical Regulation: Goal: Complications related to the disease process, condition or treatment will be avoided or minimized Outcome: Progressing

## 2023-12-23 NOTE — Progress Notes (Signed)
   12/23/23 0600  15 Minute Checks  Location Bedroom  Visual Appearance Calm  Behavior Sleeping  Sleep (Behavioral Health Patients Only)  Calculate sleep? (Click Yes once per 24 hr at 0600 safety check) Yes  Documented sleep last 24 hours 8.5

## 2023-12-23 NOTE — Progress Notes (Signed)
 [facility] MD Progress Note  12/23/2023 1:08 PM Eduardo Watts.  MRN:  [id number]  Eduardo Watts. is a 78 y.o. male with a past psychiatric history significant for MDD, GAD, insomnia and a documented medical history of prediabetes, GERD, BPH who was transferred from the [facility] emergency department to the [facility] for alcohol and benzodiazepine (lorazepam ) detox Patient is admitted to [facility] psych unit with Q15 min safety monitoring. Multidisciplinary team approach is offered. Medication management; group/milieu therapy is offered. Subjective:  Chart reviewed, case discussed in multidisciplinary meeting, patient seen during rounds.  On interview patient is noted to be resting in bed.  He took the gabapentin  and tolerated well.  He reports his anxiety is well-controlled.  He denies SI/HI/plan and denies hallucinations.  He reports having a rash on his inner thigh and provider informed that the nursing is well take a look at it and we will be providing him with the statin cream.  Provider called his wife to inform the provider about patient's chronic alcoholism which has been worsening in the last months to the point that patient will be blacking out after heavy drinking.  Wife is requesting for inpatient substance use programs.   Sleep: Fair  Appetite:  Fair  Past Psychiatric History: see h&P Family History:  Family History  Problem Relation Age of Onset   Dementia Mother    Stroke Mother    Cancer - Prostate Father    Melanoma Father    Hyperlipidemia Sister        high cholestrol   Depression Brother    Suicidality Brother    Lung disease Maternal Grandfather    Heart attack Neg Hx    Hypertension Neg Hx    Social History:  Social History   Substance and Sexual Activity  Alcohol Use Yes   Alcohol/week: 14.0 - 21.0 standard drinks of alcohol   Types: 14 - 21 Glasses of wine per week   Comment: bottle of wine a day     Social History    Substance and Sexual Activity  Drug Use No    Social History   Socioeconomic History   Marital status: Married    Spouse name: Not on file   Number of children: 1   Years of education: 17   Highest education level: Not on file  Occupational History   Not on file  Tobacco Use   Smoking status: Never    Passive exposure: Never   Smokeless tobacco: Never  Vaping Use   Vaping status: Never Used  Substance and Sexual Activity   Alcohol use: Yes    Alcohol/week: 14.0 - 21.0 standard drinks of alcohol    Types: 14 - 21 Glasses of wine per week    Comment: bottle of wine a day   Drug use: No   Sexual activity: Not Currently  Other Topics Concern   Not on file  Social History Narrative   Never smoked   Alcohol yes -2-3 glasses of wine/night   Caffeine- yes 1 cup of coffee/day   Recreational drug use -No   Diet- fruits and vegetables,less fried food more pasta   Exercise- decrease to fatigue ,does yard work,and  walks occasionally    Occupation -employed Airline pilot   Married 1 daughter/ 1 granddaughter   Goes by [employer]" loves animals   Social Drivers of Corporate investment banker Strain: Not on file  Food Insecurity: No Food Insecurity (12/21/2023)  Hunger Vital Sign    Worried About Running Out of Food in the Last Year: Never true    Ran Out of Food in the Last Year: Never true  Recent Concern: Food Insecurity - Food Insecurity Present (12/21/2023)   Hunger Vital Sign    Worried About Running Out of Food in the Last Year: Sometimes true    Ran Out of Food in the Last Year: Never true  Transportation Needs: No Transportation Needs (12/21/2023)   PRAPARE - Administrator, Civil Service (Medical): No    Lack of Transportation (Non-Medical): No  Physical Activity: Not on file  Stress: Not on file  Social Connections: Socially Integrated (12/21/2023)   Social Connection and Isolation Panel [NHANES]    Frequency of Communication with Friends and Family: Three times a  week    Frequency of Social Gatherings with Friends and Family: Twice a week    Attends Religious Services: More than 4 times per year    Active Member of [employer] or Organizations: Yes    Attends Engineer, structural: More than 4 times per year    Marital Status: Married   Past Medical History:  Past Medical History:  Diagnosis Date   Anxiety    Asthma    as a child   Depression    sees Dr Toi Foster   History of BPH    Dr.Dahlstedt   Hyperlipidemia    Pre-diabetes    Prediabetes    Sleep disturbances     Past Surgical History:  Procedure Laterality Date   CIRCUMCISION     LEFT HEART CATHETERIZATION WITH CORONARY ANGIOGRAM N/A 04/30/2014   Procedure: LEFT HEART CATHETERIZATION WITH CORONARY ANGIOGRAM;  Surgeon: Lucendia Rusk, MD;  Location: [facility] CATH LAB;  Service: Cardiovascular;  Laterality: N/A;   NASAL SEPTUM SURGERY  1975   R cheek benign tumor      1992, again 1998.   TONSILLECTOMY     TRANSURETHRAL RESECTION OF PROSTATE N/A 08/17/2022   Procedure: TRANSURETHRAL RESECTION OF THE PROSTATE (TURP);  Surgeon: Trent Frizzle, MD;  Location: [facility] ORS;  Service: Urology;  Laterality: N/A;  90 MINS    Current Medications: Current Facility-Administered Medications  Medication Dose Route Frequency Provider Last Rate Last Admin   acetaminophen  (TYLENOL ) tablet 650 mg  650 mg Oral Q6H PRN White, Patrice L, NP   650 mg at 12/23/23 1009   alum & mag hydroxide-simeth (MAALOX/MYLANTA) 200-200-20 MG/5ML suspension 30 mL  30 mL Oral Q4H PRN White, Patrice L, NP       chlordiazePOXIDE  (LIBRIUM ) capsule 25 mg  25 mg Oral Q6H PRN White, Patrice L, NP       chlordiazePOXIDE  (LIBRIUM ) capsule 25 mg  25 mg Oral TID White, Patrice L, NP   25 mg at 12/23/23 1010   Followed by   Cecily Cohen ON 12/24/2023] chlordiazePOXIDE  (LIBRIUM ) capsule 25 mg  25 mg Oral BH-qamhs White, Patrice L, NP       Followed by   Cecily Cohen ON 12/25/2023] chlordiazePOXIDE  (LIBRIUM ) capsule 25 mg  25 mg Oral Daily  White, Patrice L, NP       escitalopram  (LEXAPRO ) tablet 5 mg  5 mg Oral Daily White, Patrice L, NP   5 mg at 12/23/23 1011   fluticasone  (FLONASE ) 50 MCG/ACT nasal spray 1 spray  1 spray Each Nare Daily PRN White, Patrice L, NP   1 spray at 12/23/23 1024   gabapentin  (NEURONTIN ) capsule 200 mg  200 mg Oral BID  Mathea Frieling, MD   200 mg at 12/23/23 1010   hydrOXYzine  (ATARAX ) tablet 25 mg  25 mg Oral Q6H PRN White, Patrice L, NP   25 mg at 12/23/23 0332   hydrOXYzine  (ATARAX ) tablet 25 mg  25 mg Oral TID PRN White, Patrice L, NP   25 mg at 12/22/23 2152   loperamide  (IMODIUM ) capsule 2-4 mg  2-4 mg Oral PRN White, Patrice L, NP       magnesium  hydroxide (MILK OF MAGNESIA) suspension 30 mL  30 mL Oral Daily PRN White, Patrice L, NP       multivitamin with minerals tablet 1 tablet  1 tablet Oral Daily White, Patrice L, NP   1 tablet at 12/23/23 1010   OLANZapine  (ZYPREXA ) injection 5 mg  5 mg Intramuscular TID PRN White, Patrice L, NP       OLANZapine  zydis (ZYPREXA ) disintegrating tablet 5 mg  5 mg Oral TID PRN White, Patrice L, NP       ondansetron  (ZOFRAN -ODT) disintegrating tablet 4 mg  4 mg Oral Q6H PRN White, Patrice L, NP       thiamine  (VITAMIN B1) tablet 100 mg  100 mg Oral Daily White, Patrice L, NP   100 mg at 12/23/23 1011    Lab Results:  Results for orders placed or performed during the hospital encounter of 12/21/23 (from the past 48 hours)  SARS Coronavirus 2 by RT PCR (hospital order, performed in [facility] hospital lab) *cepheid single result test* Anterior Nasal Swab     Status: None   Collection Time: 12/21/23  6:29 PM   Specimen: Anterior Nasal Swab  Result Value Ref Range   SARS Coronavirus 2 by RT PCR NEGATIVE NEGATIVE    Comment: Performed at [facility], [address] [place], [place] [address]    Blood Alcohol level:  Lab Results  Component Value Date   ETH 141 (H) 12/21/2023   ETH 214 (H) 10/29/2020    Metabolic Disorder Labs: Lab Results   Component Value Date   HGBA1C 6.1 (H) 08/09/2022   MPG 128 08/09/2022   MPG 145.59 10/31/2020   No results found for: "PROLACTIN" Lab Results  Component Value Date   CHOL 269 (H) 11/09/2023   TRIG 105 11/09/2023   HDL 96 11/09/2023   CHOLHDL 2.8 11/09/2023   LDLCALC 155 (H) 11/09/2023   LDLCALC 139 (H) 05/24/2017    Physical Findings: AIMS:  , ,  ,  ,    CIWA:  CIWA-Ar Total: 1 COWS:      Psychiatric Specialty Exam:  Presentation  General Appearance:  Appropriate for Environment; Casual  Eye Contact: Fair  Speech: Clear and Coherent  Speech Volume: Normal    Mood and Affect  Mood: Anxious  Affect: Appropriate   Thought Process  Thought Processes: Coherent  Descriptions of Associations:Intact  Orientation:Full (Time, Place and Person)  Thought Content:Logical  Hallucinations:Hallucinations: None  Ideas of Reference:None  Suicidal Thoughts:Suicidal Thoughts: No  Homicidal Thoughts:Homicidal Thoughts: No   Sensorium  Memory: Immediate Fair; Recent Fair; Remote Fair  Judgment: Impaired  Insight: Shallow   Executive Functions  Concentration: Poor  Attention Span: Poor  Recall: Poor  Fund of Knowledge: Fair  Language: Fair   Psychomotor Activity  Psychomotor Activity: Psychomotor Activity: Normal  Musculoskeletal: Strength & Muscle Tone: within normal limits Gait & Station: normal Assets  Assets: Manufacturing systems engineer; Social Support; Resilience    Physical Exam: Physical Exam Vitals and nursing note reviewed.  HENT:  Head: Normocephalic.     Nose: Nose normal.     Mouth/Throat:     Mouth: Mucous membranes are moist.  Cardiovascular:     Rate and Rhythm: Normal rate.  Pulmonary:     Effort: Pulmonary effort is normal.  Skin:    General: Skin is warm.  Neurological:     Mental Status: He is alert.    ROS Blood pressure 118/64, pulse 89, temperature 98.4 F (36.9 C), resp. rate 18, height 6\' 2"   (1.88 m), weight 86.4 kg, SpO2 93%. Body mass index is 24.46 kg/m.  Diagnosis: Principal Problem:   MDD (major depressive disorder) Moderate Alcohol use disorder,sever Sedative hypnotic use disorder  Clinical Decision Making: Patient with history of anxiety, alcohol use disorder, chronic benzo use as prescribed is currently admitted to inpatient psychiatric unit for detoxing from alcohol and chronic benzos use.  Patient is currently tolerating Librium  taper fairly well.   Treatment Plan Summary:   Safety and Monitoring:             -- Voluntary admission to inpatient psychiatric unit for safety, stabilization and treatment             -- Daily contact with patient to assess and evaluate symptoms and progress in treatment             -- Patient's case to be discussed in multi-disciplinary team meeting             -- Observation Level: q15 minute checks             -- Vital signs:  q12 hours             -- Precautions: suicide, elopement, and assault   2. Psychiatric Diagnoses and Treatment:               Librium  taper On Lexapro  5 mg-will plan to titrate it up for optimization of anxiety symptoms control Added gabapentin  200 mg twice daily to help with chronic alcoholism/benzo withdrawal upon discharge.   -- The risks/benefits/side-effects/alternatives to this medication were discussed in detail with the patient and time was given for questions. The patient consents to medication trial.                -- Metabolic profile and EKG monitoring obtained while on an atypical antipsychotic (BMI: Lipid Panel: HbgA1c: QTc:)              -- Encouraged patient to participate in unit milieu and in scheduled group therapies                            3. Medical Issues Being Addressed:      4. Discharge Planning:              -- Social work and case management to assist with discharge planning and identification of hospital follow-up needs prior to discharge             -- Estimated LOS: 5-7  days             -- Discharge Concerns: Need to establish a safety plan; Medication compliance and effectiveness             -- Discharge Goals: Return home with outpatient referrals follow ups  Aurelia Blotter, MD 12/23/2023, 1:08 PM

## 2023-12-23 NOTE — Progress Notes (Signed)
   12/22/23 2152  Psych Admission Type (Psych Patients Only)  Admission Status Voluntary  Psychosocial Assessment  Patient Complaints Anxiety  Eye Contact Fair  Facial Expression Flat  Affect Appropriate to circumstance  Speech Logical/coherent  Interaction Minimal  Motor Activity Unsteady  Appearance/Hygiene In scrubs  Behavior Characteristics Cooperative;Appropriate to situation  Mood Pleasant  Aggressive Behavior  Effect No apparent injury  Thought Process  Coherency WDL  Content WDL  Delusions WDL  Perception WDL  Hallucination None reported or observed  Judgment WDL  Confusion WDL  Danger to Self  Current suicidal ideation? Denies  Agreement Not to Harm Self Yes  Description of Agreement Verbalize  Danger to Others  Danger to Others None reported or observed  Danger to Others Abnormal  Harmful Behavior to others No threats or harm toward other people  Destructive Behavior No threats or harm toward property

## 2023-12-24 DIAGNOSIS — F331 Major depressive disorder, recurrent, moderate: Secondary | ICD-10-CM | POA: Diagnosis not present

## 2023-12-24 MED ORDER — LACTULOSE 10 GM/15ML PO SOLN
10.0000 g | Freq: Two times a day (BID) | ORAL | Status: DC | PRN
  Administered 2023-12-24: 10 g via ORAL
  Filled 2023-12-24: qty 30

## 2023-12-24 NOTE — Group Note (Signed)
 Date:  12/24/2023 Time:  10:11 PM  Group Topic/Focus:  Wrap-Up Group:   The focus of this group is to help patients review their daily goal of treatment and discuss progress on daily workbooks.    Participation Level:  Active  Participation Quality:  Appropriate  Affect:  Appropriate  Cognitive:  Appropriate  Insight: Good  Engagement in Group:  Engaged  Modes of Intervention:  Discussion  Additional Comments:    Eduardo Watts 12/24/2023, 10:11 PM

## 2023-12-24 NOTE — BH IP Treatment Plan (Signed)
 Interdisciplinary Treatment and Diagnostic Plan Update  12/24/2023 Time of Session: 11:02 AM  Eduardo Watts. MRN: [id number]  Principal Diagnosis: MDD (major depressive disorder)  Secondary Diagnoses: Principal Problem:   MDD (major depressive disorder)   Current Medications:  Current Facility-Administered Medications  Medication Dose Route Frequency Provider Last Rate Last Admin   acetaminophen  (TYLENOL ) tablet 650 mg  650 mg Oral Q6H PRN White, Patrice L, NP   650 mg at 12/23/23 1009   alum & mag hydroxide-simeth (MAALOX/MYLANTA) 200-200-20 MG/5ML suspension 30 mL  30 mL Oral Q4H PRN White, Patrice L, NP       chlordiazePOXIDE  (LIBRIUM ) capsule 25 mg  25 mg Oral Q6H PRN White, Patrice L, NP       chlordiazePOXIDE  (LIBRIUM ) capsule 25 mg  25 mg Oral BH-qamhs White, Patrice L, NP   25 mg at 12/24/23 0915   Followed by   Cecily Cohen ON 12/25/2023] chlordiazePOXIDE  (LIBRIUM ) capsule 25 mg  25 mg Oral Daily White, Patrice L, NP       escitalopram  (LEXAPRO ) tablet 5 mg  5 mg Oral Daily White, Patrice L, NP   5 mg at 12/24/23 0915   fluticasone  (FLONASE ) 50 MCG/ACT nasal spray 1 spray  1 spray Each Nare Daily Aurelia Blotter, MD   1 spray at 12/24/23 0915   gabapentin  (NEURONTIN ) capsule 200 mg  200 mg Oral BID Jadapalle, Sree, MD   200 mg at 12/24/23 0915   hydrOXYzine  (ATARAX ) tablet 25 mg  25 mg Oral Q6H PRN White, Patrice L, NP   25 mg at 12/23/23 0865   hydrOXYzine  (ATARAX ) tablet 25 mg  25 mg Oral TID PRN White, Patrice L, NP   25 mg at 12/23/23 2016   loperamide  (IMODIUM ) capsule 2-4 mg  2-4 mg Oral PRN White, Patrice L, NP       magnesium  hydroxide (MILK OF MAGNESIA) suspension 30 mL  30 mL Oral Daily PRN White, Patrice L, NP   30 mL at 12/23/23 2122   multivitamin with minerals tablet 1 tablet  1 tablet Oral Daily White, Patrice L, NP   1 tablet at 12/24/23 0915   nystatin  (MYCOSTATIN /NYSTOP ) topical powder   Topical BID PRN Jadapalle, Sree, MD   1 Application at 12/24/23 1117    OLANZapine  (ZYPREXA ) injection 5 mg  5 mg Intramuscular TID PRN White, Patrice L, NP       OLANZapine  zydis (ZYPREXA ) disintegrating tablet 5 mg  5 mg Oral TID PRN White, Patrice L, NP       ondansetron  (ZOFRAN -ODT) disintegrating tablet 4 mg  4 mg Oral Q6H PRN White, Patrice L, NP       thiamine  (VITAMIN B1) tablet 100 mg  100 mg Oral Daily White, Patrice L, NP   100 mg at 12/24/23 0916   PTA Medications: Medications Prior to Admission  Medication Sig Dispense Refill Last Dose/Taking   fluticasone  (FLONASE ) 50 MCG/ACT nasal spray Place 1 spray into both nostrils daily.        Patient Stressors: Substance abuse    Patient Strengths: Ability for insight  Active sense of humor  Communication skills  General fund of knowledge  Motivation for treatment/growth  Supportive family/friends   Treatment Modalities: Medication Management, Group therapy, Case management,  1 to 1 session with clinician, Psychoeducation, Recreational therapy.   Physician Treatment Plan for Primary Diagnosis: MDD (major depressive disorder) Long Term Goal(s): Improvement in symptoms so as ready for discharge   Short Term Goals: Ability to identify changes  in lifestyle to reduce recurrence of condition will improve Ability to verbalize feelings will improve Ability to disclose and discuss suicidal ideas Ability to demonstrate self-control will improve Ability to identify and develop effective coping behaviors will improve Ability to maintain clinical measurements within normal limits will improve Compliance with prescribed medications will improve Ability to identify triggers associated with substance abuse/mental health issues will improve  Medication Management: Evaluate patient's response, side effects, and tolerance of medication regimen.  Therapeutic Interventions: 1 to 1 sessions, Unit Group sessions and Medication administration.  Evaluation of Outcomes: Progressing  Physician Treatment Plan for  Secondary Diagnosis: Principal Problem:   MDD (major depressive disorder)  Long Term Goal(s): Improvement in symptoms so as ready for discharge   Short Term Goals: Ability to identify changes in lifestyle to reduce recurrence of condition will improve Ability to verbalize feelings will improve Ability to disclose and discuss suicidal ideas Ability to demonstrate self-control will improve Ability to identify and develop effective coping behaviors will improve Ability to maintain clinical measurements within normal limits will improve Compliance with prescribed medications will improve Ability to identify triggers associated with substance abuse/mental health issues will improve     Medication Management: Evaluate patient's response, side effects, and tolerance of medication regimen.  Therapeutic Interventions: 1 to 1 sessions, Unit Group sessions and Medication administration.  Evaluation of Outcomes: Progressing   RN Treatment Plan for Primary Diagnosis: MDD (major depressive disorder) Long Term Goal(s): Knowledge of disease and therapeutic regimen to maintain health will improve  Short Term Goals: Ability to remain free from injury will improve, Ability to verbalize frustration and anger appropriately will improve, Ability to demonstrate self-control, Ability to participate in decision making will improve, Ability to verbalize feelings will improve, Ability to disclose and discuss suicidal ideas, Ability to identify and develop effective coping behaviors will improve, and Compliance with prescribed medications will improve  Medication Management: RN will administer medications as ordered by provider, will assess and evaluate patient's response and provide education to patient for prescribed medication. RN will report any adverse and/or side effects to prescribing provider.  Therapeutic Interventions: 1 on 1 counseling sessions, Psychoeducation, Medication administration, Evaluate  responses to treatment, Monitor vital signs and CBGs as ordered, Perform/monitor CIWA, COWS, AIMS and Fall Risk screenings as ordered, Perform wound care treatments as ordered.  Evaluation of Outcomes: Progressing   LCSW Treatment Plan for Primary Diagnosis: MDD (major depressive disorder) Long Term Goal(s): Safe transition to appropriate next level of care at discharge, Engage patient in therapeutic group addressing interpersonal concerns.  Short Term Goals: Engage patient in aftercare planning with referrals and resources, Increase social support, Increase ability to appropriately verbalize feelings, Increase emotional regulation, Facilitate acceptance of mental health diagnosis and concerns, Facilitate patient progression through stages of change regarding substance use diagnoses and concerns, Identify triggers associated with mental health/substance abuse issues, and Increase skills for wellness and recovery  Therapeutic Interventions: Assess for all discharge needs, 1 to 1 time with Social worker, Explore available resources and support systems, Assess for adequacy in community support network, Educate family and significant other(s) on suicide prevention, Complete Psychosocial Assessment, Interpersonal group therapy.  Evaluation of Outcomes: Progressing   Progress in Treatment: Attending groups: No. Participating in groups: No. Taking medication as prescribed: Yes. Toleration medication: Yes. Family/Significant other contact made: No, will contact:  CSW will contact if given permission  Patient understands diagnosis: Yes. Discussing patient identified problems/goals with staff: Yes. Medical problems stabilized or resolved: Yes. Denies suicidal/homicidal ideation: Yes.  Issues/concerns per patient self-inventory: No. Other: None   New problem(s) identified: No, Describe:  None identified   New Short Term/Long Term Goal(s): elimination of symptoms of psychosis, medication management  for mood stabilization; elimination of SI thoughts; development of comprehensive mental wellness plan.   Patient Goals:  "To reduce my dependence on Lorazepam  and too much wine"   Discharge Plan or Barriers: CSW will assist with appropriate discharge planning   Reason for Continuation of Hospitalization: Depression Medication stabilization Withdrawal symptoms  Estimated Length of Stay: 1 to 7 days   Last 3 [place] Suicide Severity Risk Score: Flowsheet Row Admission (Current) from 12/21/2023 in [facility] Most recent reading at 12/21/2023 11:16 PM ED from 12/21/2023 in [facility] Most recent reading at 12/21/2023  4:01 PM ED from 12/21/2023 in [facility] Emergency Department at [facility] Most recent reading at 12/21/2023  6:42 AM  C-SSRS RISK CATEGORY No Risk Low Risk High Risk       Last PHQ 2/9 Scores:    12/21/2023    5:35 PM  Depression screen PHQ 2/9  Decreased Interest 3  Down, Depressed, Hopeless 1  PHQ - 2 Score 4  Altered sleeping 3  Tired, decreased energy 2  Change in appetite 0  Feeling bad or failure about yourself  3  Trouble concentrating 1  Moving slowly or fidgety/restless 0  Suicidal thoughts 0  PHQ-9 Score 13  Difficult doing work/chores Very difficult    Scribe for Treatment Team: Claudio Culver, Milinda Allen 12/24/2023 1:44 PM

## 2023-12-24 NOTE — Progress Notes (Signed)
 [facility] MD Progress Note  12/24/2023 2:56 PM Eduardo Watts.  MRN:  [id number]  Eduardo Watts. is a 78 y.o. male with a past psychiatric history significant for MDD, GAD, insomnia and a documented medical history of prediabetes, GERD, BPH who was transferred from the [facility] emergency department to the [facility] for alcohol and benzodiazepine (lorazepam ) detox Patient is admitted to [facility] psych unit with Q15 min safety monitoring. Multidisciplinary team approach is offered. Medication management; group/milieu therapy is offered. Subjective:  Chart reviewed, case discussed in multidisciplinary meeting, patient seen during rounds.  Patient is noted to be resting in bed.  He met with the treatment team.  Patient and the treatment team discussed extensively about the postdischarge follow-up and discussed his wife's request about patient to consider inpatient rehab.  Patient declined inpatient rehab or any intensive outpatient S IOP programs.  Patient reports that he can control his alcoholism and his addiction to lorazepam  and talked about the high functioning life.  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  He denies SI/HI/plan and denies hallucinations.  He reports his anxiety is under control.  He is tolerating the detox very well with no problems   Sleep: Fair  Appetite:  Fair  Past Psychiatric History: see h&P Family History:  Family History  Problem Relation Age of Onset   Dementia Mother    Stroke Mother    Cancer - Prostate Father    Melanoma Father    Hyperlipidemia Sister        high cholestrol   Depression Brother    Suicidality Brother    Lung disease Maternal Grandfather    Heart attack Neg Hx    Hypertension Neg Hx    Social History:  Social History   Substance and Sexual Activity  Alcohol Use Yes   Alcohol/week: 14.0 - 21.0 standard drinks of alcohol   Types: 14 - 21 Glasses of wine per week   Comment: bottle of wine a day      Social History   Substance and Sexual Activity  Drug Use No    Social History   Socioeconomic History   Marital status: Married    Spouse name: Not on file   Number of children: 1   Years of education: 17   Highest education level: Not on file  Occupational History   Not on file  Tobacco Use   Smoking status: Never    Passive exposure: Never   Smokeless tobacco: Never  Vaping Use   Vaping status: Never Used  Substance and Sexual Activity   Alcohol use: Yes    Alcohol/week: 14.0 - 21.0 standard drinks of alcohol    Types: 14 - 21 Glasses of wine per week    Comment: bottle of wine a day   Drug use: No   Sexual activity: Not Currently  Other Topics Concern   Not on file  Social History Narrative   Never smoked   Alcohol yes -2-3 glasses of wine/night   Caffeine- yes 1 cup of coffee/day   Recreational drug use -No   Diet- fruits and vegetables,less fried food more pasta   Exercise- decrease to fatigue ,does yard work,and  walks occasionally    Occupation -employed Airline pilot   Married 1 daughter/ 1 granddaughter   Goes by [employer]" loves animals   Social Drivers of Corporate investment banker Strain: Not on file  Food Insecurity: No Food Insecurity (12/21/2023)   Hunger  Vital Sign    Worried About Programme researcher, broadcasting/film/video in the Last Year: Never true    Ran Out of Food in the Last Year: Never true  Recent Concern: Food Insecurity - Food Insecurity Present (12/21/2023)   Hunger Vital Sign    Worried About Running Out of Food in the Last Year: Sometimes true    Ran Out of Food in the Last Year: Never true  Transportation Needs: No Transportation Needs (12/21/2023)   PRAPARE - Administrator, Civil Service (Medical): No    Lack of Transportation (Non-Medical): No  Physical Activity: Not on file  Stress: Not on file  Social Connections: Socially Integrated (12/21/2023)   Social Connection and Isolation Panel [NHANES]    Frequency of Communication with Friends and  Family: Three times a week    Frequency of Social Gatherings with Friends and Family: Twice a week    Attends Religious Services: More than 4 times per year    Active Member of [employer] or Organizations: Yes    Attends Engineer, structural: More than 4 times per year    Marital Status: Married   Past Medical History:  Past Medical History:  Diagnosis Date   Anxiety    Asthma    as a child   Depression    sees Dr Toi Foster   History of BPH    Dr.Dahlstedt   Hyperlipidemia    Pre-diabetes    Prediabetes    Sleep disturbances     Past Surgical History:  Procedure Laterality Date   CIRCUMCISION     LEFT HEART CATHETERIZATION WITH CORONARY ANGIOGRAM N/A 04/30/2014   Procedure: LEFT HEART CATHETERIZATION WITH CORONARY ANGIOGRAM;  Surgeon: Lucendia Rusk, MD;  Location: [facility] CATH LAB;  Service: Cardiovascular;  Laterality: N/A;   NASAL SEPTUM SURGERY  1975   R cheek benign tumor      1992, again 1998.   TONSILLECTOMY     TRANSURETHRAL RESECTION OF PROSTATE N/A 08/17/2022   Procedure: TRANSURETHRAL RESECTION OF THE PROSTATE (TURP);  Surgeon: Trent Frizzle, MD;  Location: [facility] ORS;  Service: Urology;  Laterality: N/A;  90 MINS    Current Medications: Current Facility-Administered Medications  Medication Dose Route Frequency Provider Last Rate Last Admin   acetaminophen  (TYLENOL ) tablet 650 mg  650 mg Oral Q6H PRN White, Patrice L, NP   650 mg at 12/23/23 1009   alum & mag hydroxide-simeth (MAALOX/MYLANTA) 200-200-20 MG/5ML suspension 30 mL  30 mL Oral Q4H PRN White, Patrice L, NP       chlordiazePOXIDE  (LIBRIUM ) capsule 25 mg  25 mg Oral Q6H PRN White, Patrice L, NP       chlordiazePOXIDE  (LIBRIUM ) capsule 25 mg  25 mg Oral BH-qamhs White, Patrice L, NP   25 mg at 12/24/23 0915   Followed by   Cecily Cohen ON 12/25/2023] chlordiazePOXIDE  (LIBRIUM ) capsule 25 mg  25 mg Oral Daily White, Patrice L, NP       escitalopram  (LEXAPRO ) tablet 5 mg  5 mg Oral Daily White, Patrice L, NP   5  mg at 12/24/23 0915   fluticasone  (FLONASE ) 50 MCG/ACT nasal spray 1 spray  1 spray Each Nare Daily Aurelia Blotter, MD   1 spray at 12/24/23 0915   gabapentin  (NEURONTIN ) capsule 200 mg  200 mg Oral BID Aamya Orellana, MD   200 mg at 12/24/23 0915   hydrOXYzine  (ATARAX ) tablet 25 mg  25 mg Oral Q6H PRN White, Patrice L, NP   25  mg at 12/23/23 2130   hydrOXYzine  (ATARAX ) tablet 25 mg  25 mg Oral TID PRN White, Patrice L, NP   25 mg at 12/23/23 2016   loperamide  (IMODIUM ) capsule 2-4 mg  2-4 mg Oral PRN White, Patrice L, NP       magnesium  hydroxide (MILK OF MAGNESIA) suspension 30 mL  30 mL Oral Daily PRN White, Patrice L, NP   30 mL at 12/23/23 2122   multivitamin with minerals tablet 1 tablet  1 tablet Oral Daily Nickola Baron L, NP   1 tablet at 12/24/23 0915   nystatin  (MYCOSTATIN /NYSTOP ) topical powder   Topical BID PRN Simrat Kendrick, MD   1 Application at 12/24/23 1117   OLANZapine  (ZYPREXA ) injection 5 mg  5 mg Intramuscular TID PRN White, Patrice L, NP       OLANZapine  zydis (ZYPREXA ) disintegrating tablet 5 mg  5 mg Oral TID PRN White, Patrice L, NP       ondansetron  (ZOFRAN -ODT) disintegrating tablet 4 mg  4 mg Oral Q6H PRN White, Patrice L, NP       thiamine  (VITAMIN B1) tablet 100 mg  100 mg Oral Daily White, Patrice L, NP   100 mg at 12/24/23 8657    Lab Results:  No results found for this or any previous visit (from the past 48 hours).   Blood Alcohol level:  Lab Results  Component Value Date   ETH 141 (H) 12/21/2023   ETH 214 (H) 10/29/2020    Metabolic Disorder Labs: Lab Results  Component Value Date   HGBA1C 6.1 (H) 08/09/2022   MPG 128 08/09/2022   MPG 145.59 10/31/2020   No results found for: "PROLACTIN" Lab Results  Component Value Date   CHOL 269 (H) 11/09/2023   TRIG 105 11/09/2023   HDL 96 11/09/2023   CHOLHDL 2.8 11/09/2023   LDLCALC 155 (H) 11/09/2023   LDLCALC 139 (H) 05/24/2017    Physical Findings: AIMS:  , ,  ,  ,    CIWA:  CIWA-Ar Total:  3 COWS:      Psychiatric Specialty Exam:  Presentation  General Appearance:  Appropriate for Environment; Casual  Eye Contact: Fair  Speech: Clear and Coherent  Speech Volume: Normal    Mood and Affect  Mood: Anxious  Affect: Appropriate   Thought Process  Thought Processes: Coherent  Descriptions of Associations:Intact  Orientation:Full (Time, Place and Person)  Thought Content:Logical  Hallucinations: Denies  Ideas of Reference:None  Suicidal Thoughts: Denies  Homicidal Thoughts: Denies   Sensorium  Memory: Immediate Fair; Recent Fair; Remote Fair  Judgment: Impaired  Insight: Shallow   Executive Functions  Concentration: Poor  Attention Span: Poor  Recall: Poor  Fund of Knowledge: Fair  Language: Fair   Psychomotor Activity  Psychomotor Activity: No data recorded  Musculoskeletal: Strength & Muscle Tone: within normal limits Gait & Station: normal Assets  Assets: Manufacturing systems engineer; Social Support; Resilience    Physical Exam: Physical Exam Vitals and nursing note reviewed.  HENT:     Head: Normocephalic.     Nose: Nose normal.     Mouth/Throat:     Mouth: Mucous membranes are moist.  Cardiovascular:     Rate and Rhythm: Normal rate.  Pulmonary:     Effort: Pulmonary effort is normal.  Skin:    General: Skin is warm.  Neurological:     Mental Status: He is alert.    ROS Blood pressure 109/65, pulse 98, temperature 98.1 F (36.7 C), resp. rate 17,  height 6\' 2"  (1.88 m), weight 86.4 kg, SpO2 93%. Body mass index is 24.46 kg/m.  Diagnosis: Principal Problem:   MDD (major depressive disorder) Moderate Alcohol use disorder,sever Sedative hypnotic use disorder  Clinical Decision Making: Patient with history of anxiety, alcohol use disorder, chronic benzo use as prescribed is currently admitted to inpatient psychiatric unit for detoxing from alcohol and chronic benzos use.  Patient is currently  tolerating Librium  taper fairly well.   Treatment Plan Summary:   Safety and Monitoring:             -- Voluntary admission to inpatient psychiatric unit for safety, stabilization and treatment             -- Daily contact with patient to assess and evaluate symptoms and progress in treatment             -- Patient's case to be discussed in multi-disciplinary team meeting             -- Observation Level: q15 minute checks             -- Vital signs:  q12 hours             -- Precautions: suicide, elopement, and assault   2. Psychiatric Diagnoses and Treatment:               Librium  taper On Lexapro  5 mg-will plan to titrate it up for optimization of anxiety symptoms control Added gabapentin  200 mg twice daily to help with chronic alcoholism/benzo withdrawal upon discharge.   -- The risks/benefits/side-effects/alternatives to this medication were discussed in detail with the patient and time was given for questions. The patient consents to medication trial.                -- Metabolic profile and EKG monitoring obtained while on an atypical antipsychotic (BMI: Lipid Panel: HbgA1c: QTc:)              -- Encouraged patient to participate in unit milieu and in scheduled group therapies                            3. Medical Issues Being Addressed:      4. Discharge Planning:              -- Social work and case management to assist with discharge planning and identification of hospital follow-up needs prior to discharge             -- Estimated LOS: 5-7 days             -- Discharge Concerns: Need to establish a safety plan; Medication compliance and effectiveness             -- Discharge Goals: Return home with outpatient referrals follow ups  Aurelia Blotter, MD 12/24/2023, 2:56 PM

## 2023-12-24 NOTE — Group Note (Signed)
 Date:  12/24/2023 Time:  12:13 PM  Group Topic/Focus:  The purpose of this group is to touch into patients past and discuss what it was like and some of their likes and dislikes from their past.    Participation Level:  Did Not Attend  Eduardo Watts 12/24/2023, 12:13 PM

## 2023-12-24 NOTE — Plan of Care (Signed)
   Problem: Coping: Goal: Ability to demonstrate self-control will improve Outcome: Progressing   Problem: Health Behavior/Discharge Planning: Goal: Compliance with treatment plan for underlying cause of condition will improve Outcome: Progressing   Problem: Safety: Goal: Periods of time without injury will increase Outcome: Progressing

## 2023-12-24 NOTE — Group Note (Deleted)
 Date:  12/24/2023 Time:  12:08 PM  Group Topic/Focus:  Personal Choices and Values:   The focus of this group is to help patients assess and explore the importance of values in their lives, how their values affect their decisions, how they express their values and what opposes their expression.     Participation Level:  {BHH PARTICIPATION HQION:62952}  Participation Quality:  {BHH PARTICIPATION QUALITY:22265}  Affect:  {BHH AFFECT:22266}  Cognitive:  {BHH COGNITIVE:22267}  Insight: {BHH Insight2:20797}  Engagement in Group:  {BHH ENGAGEMENT IN WUXLK:44010}  Modes of Intervention:  {BHH MODES OF INTERVENTION:22269}  Additional Comments:  ***  Fay Swider T Yojan Paskett 12/24/2023, 12:08 PM

## 2023-12-24 NOTE — Group Note (Signed)
 Recreation Therapy Group Note   Group Topic:Emotion Expression  Group Date: 12/24/2023 Start Time: 1400 End Time: 1500 Facilitators: Deatrice Factor, LRT, CTRS Location: Courtyard  Group Description: Music. Patients encouraged to name their favorite song(s) for LRT to play song through speaker for group to hear. Patient educated on the definition of leisure and the importance of having different leisure interests outside of the hospital. Group discussed how leisure activities can often be used as Pharmacologist and that listening to music is one example. Patients also assisted in watering the raised garden beds in the courtyard.   Goal Area(s) Addressed:  Patient will identify a current leisure interest.  Patient will practice making a positive decision. Patient will have the opportunity to try a new leisure activity.   Affect/Mood: N/A   Participation Level: Did not attend    Clinical Observations/Individualized Feedback: Patient did not attend group.   Plan: Continue to engage patient in RT group sessions 2-3x/week.   Deatrice Factor, LRT, CTRS 12/24/2023 4:48 PM

## 2023-12-24 NOTE — Progress Notes (Signed)
 Pt. Has remained in his bed resting on/off since the onset of the shift. No behavioral issues noted/ reported. Pt. Compliant with all aspect of care this shift. Easily compliant with scheduled medication. Pt. Requested PRN M.O.M, for c/o constipation, which was provided. Pt. Reported having a medium BM this morning. Gait is slow and steady. No distress noted/ reported. Remains alert and oriented x 2. Pt. Is currently awake in his room. No distress noted/ reported at this time.  All orders maintained as written.

## 2023-12-24 NOTE — Plan of Care (Signed)
  Problem: Education: Goal: Knowledge of disease or condition will improve Outcome: Progressing Goal: Understanding of discharge needs will improve Outcome: Progressing   Problem: Health Behavior/Discharge Planning: Goal: Ability to identify changes in lifestyle to reduce recurrence of condition will improve Outcome: Progressing   Problem: Physical Regulation: Goal: Complications related to the disease process, condition or treatment will be avoided or minimized Outcome: Progressing   Problem: Safety: Goal: Ability to remain free from injury will improve Outcome: Progressing   Problem: Education: Goal: Knowledge of [facility] Education information/materials will improve Outcome: Progressing Goal: Emotional status will improve Outcome: Progressing Goal: Mental status will improve Outcome: Progressing Goal: Verbalization of understanding the information provided will improve Outcome: Progressing   Problem: Activity: Goal: Interest or engagement in activities will improve Outcome: Progressing Goal: Sleeping patterns will improve Outcome: Progressing   Problem: Coping: Goal: Ability to verbalize frustrations and anger appropriately will improve Outcome: Progressing Goal: Ability to demonstrate self-control will improve Outcome: Progressing   Problem: Health Behavior/Discharge Planning: Goal: Identification of resources available to assist in meeting health care needs will improve Outcome: Progressing Goal: Compliance with treatment plan for underlying cause of condition will improve Outcome: Progressing   Problem: Physical Regulation: Goal: Ability to maintain clinical measurements within normal limits will improve Outcome: Progressing   Problem: Safety: Goal: Periods of time without injury will increase Outcome: Progressing

## 2023-12-24 NOTE — Group Note (Signed)
 Date:  12/24/2023 Time:  12:04 AM  Group Topic/Focus:  Wrap-Up Group:   The focus of this group is to help patients review their daily goal of treatment and discuss progress on daily workbooks.    Participation Level:  Did Not Attend  Participation Quality:     Affect:     Cognitive:     Insight: None  Engagement in Group:  None  Modes of Intervention:     Additional Comments:    Rolland Cline 12/24/2023, 12:04 AM

## 2023-12-24 NOTE — Progress Notes (Signed)
 Patient is pleasant and cooperative.  Animated affect.  Endorses anxiety.  Denies SI/HI and AVH.  Denies depression.  Denies pain.    Compliant with scheduled medications.  15 min checks in place for safety.  Patient isolates to room with the exception of meals.  Appropriate interaction with peers and staff when in the milieu.

## 2023-12-25 DIAGNOSIS — F331 Major depressive disorder, recurrent, moderate: Secondary | ICD-10-CM | POA: Diagnosis not present

## 2023-12-25 MED ORDER — ESCITALOPRAM OXALATE 10 MG PO TABS
10.0000 mg | ORAL_TABLET | Freq: Every day | ORAL | Status: DC
  Administered 2023-12-26 – 2023-12-27 (×2): 10 mg via ORAL
  Filled 2023-12-25 (×2): qty 1

## 2023-12-25 NOTE — Progress Notes (Signed)
 [facility] MD Progress Note  12/25/2023 3:39 PM Eduardo Watts.  MRN:  [id number]  Eduardo Watts. is a 78 y.o. male with a past psychiatric history significant for MDD, GAD, insomnia and a documented medical history of prediabetes, GERD, BPH who was transferred from the [facility] emergency department to the [facility] for alcohol and benzodiazepine (lorazepam ) detox Patient is admitted to [facility] psych unit with Q15 min safety monitoring. Multidisciplinary team approach is offered. Medication management; group/milieu therapy is offered. Subjective:  Chart reviewed, case discussed in multidisciplinary meeting, patient seen during rounds.   Patient is noted to be resting in his room.  He reports that he is feeling a lot better today as the lactulose  worked for his constipation.  He expressed his gratitude to the team.  He denies feeling depressed or anxious he denies any withdrawal symptoms and his vitals are stable.  He reports fair appetite and sleep.  He denies SI/HI/plan and denies hallucinations.  Sleep: Fair  Appetite:  Fair  Past Psychiatric History: see h&P Family History:  Family History  Problem Relation Age of Onset   Dementia Mother    Stroke Mother    Cancer - Prostate Father    Melanoma Father    Hyperlipidemia Sister        high cholestrol   Depression Brother    Suicidality Brother    Lung disease Maternal Grandfather    Heart attack Neg Hx    Hypertension Neg Hx    Social History:  Social History   Substance and Sexual Activity  Alcohol Use Yes   Alcohol/week: 14.0 - 21.0 standard drinks of alcohol   Types: 14 - 21 Glasses of wine per week   Comment: bottle of wine a day     Social History   Substance and Sexual Activity  Drug Use No    Social History   Socioeconomic History   Marital status: Married    Spouse name: Not on file   Number of children: 1   Years of education: 17   Highest education level: Not on file   Occupational History   Not on file  Tobacco Use   Smoking status: Never    Passive exposure: Never   Smokeless tobacco: Never  Vaping Use   Vaping status: Never Used  Substance and Sexual Activity   Alcohol use: Yes    Alcohol/week: 14.0 - 21.0 standard drinks of alcohol    Types: 14 - 21 Glasses of wine per week    Comment: bottle of wine a day   Drug use: No   Sexual activity: Not Currently  Other Topics Concern   Not on file  Social History Narrative   Never smoked   Alcohol yes -2-3 glasses of wine/night   Caffeine- yes 1 cup of coffee/day   Recreational drug use -No   Diet- fruits and vegetables,less fried food more pasta   Exercise- decrease to fatigue ,does yard work,and  walks occasionally    Occupation -employed Airline pilot   Married 1 daughter/ 1 granddaughter   Goes by [employer]" loves animals   Social Drivers of Corporate investment banker Strain: Not on file  Food Insecurity: No Food Insecurity (12/21/2023)   Hunger Vital Sign    Worried About Running Out of Food in the Last Year: Never true    Ran Out of Food in the Last Year: Never true  Recent Concern: Food Insecurity - Food Insecurity Present (12/21/2023)  Hunger Vital Sign    Worried About Running Out of Food in the Last Year: Sometimes true    Ran Out of Food in the Last Year: Never true  Transportation Needs: No Transportation Needs (12/21/2023)   PRAPARE - Administrator, Civil Service (Medical): No    Lack of Transportation (Non-Medical): No  Physical Activity: Not on file  Stress: Not on file  Social Connections: Socially Integrated (12/21/2023)   Social Connection and Isolation Panel [NHANES]    Frequency of Communication with Friends and Family: Three times a week    Frequency of Social Gatherings with Friends and Family: Twice a week    Attends Religious Services: More than 4 times per year    Active Member of [employer] or Organizations: Yes    Attends Engineer, structural: More than 4  times per year    Marital Status: Married   Past Medical History:  Past Medical History:  Diagnosis Date   Anxiety    Asthma    as a child   Depression    sees Dr Toi Foster   History of BPH    Dr.Dahlstedt   Hyperlipidemia    Pre-diabetes    Prediabetes    Sleep disturbances     Past Surgical History:  Procedure Laterality Date   CIRCUMCISION     LEFT HEART CATHETERIZATION WITH CORONARY ANGIOGRAM N/A 04/30/2014   Procedure: LEFT HEART CATHETERIZATION WITH CORONARY ANGIOGRAM;  Surgeon: Lucendia Rusk, MD;  Location: [facility] CATH LAB;  Service: Cardiovascular;  Laterality: N/A;   NASAL SEPTUM SURGERY  1975   R cheek benign tumor      1992, again 1998.   TONSILLECTOMY     TRANSURETHRAL RESECTION OF PROSTATE N/A 08/17/2022   Procedure: TRANSURETHRAL RESECTION OF THE PROSTATE (TURP);  Surgeon: Trent Frizzle, MD;  Location: [facility] ORS;  Service: Urology;  Laterality: N/A;  90 MINS    Current Medications: Current Facility-Administered Medications  Medication Dose Route Frequency Provider Last Rate Last Admin   acetaminophen  (TYLENOL ) tablet 650 mg  650 mg Oral Q6H PRN White, Patrice L, NP   650 mg at 12/23/23 1009   alum & mag hydroxide-simeth (MAALOX/MYLANTA) 200-200-20 MG/5ML suspension 30 mL  30 mL Oral Q4H PRN White, Patrice L, NP       escitalopram  (LEXAPRO ) tablet 5 mg  5 mg Oral Daily White, Patrice L, NP   5 mg at 12/25/23 0916   fluticasone  (FLONASE ) 50 MCG/ACT nasal spray 1 spray  1 spray Each Nare Daily Aurelia Blotter, MD   1 spray at 12/25/23 0981   gabapentin  (NEURONTIN ) capsule 200 mg  200 mg Oral BID Mescal Flinchbaugh, MD   200 mg at 12/25/23 1914   hydrOXYzine  (ATARAX ) tablet 25 mg  25 mg Oral TID PRN White, Patrice L, NP   25 mg at 12/24/23 2238   lactulose  (CHRONULAC ) 10 GM/15ML solution 10 g  10 g Oral BID PRN Isel Skufca, MD   10 g at 12/24/23 2239   magnesium  hydroxide (MILK OF MAGNESIA) suspension 30 mL  30 mL Oral Daily PRN White, Patrice L, NP   30 mL at  12/24/23 1546   multivitamin with minerals tablet 1 tablet  1 tablet Oral Daily White, Patrice L, NP   1 tablet at 12/25/23 7829   nystatin  (MYCOSTATIN /NYSTOP ) topical powder   Topical BID PRN Marijane Trower, MD   1 Application at 12/24/23 1117   OLANZapine  (ZYPREXA ) injection 5 mg  5 mg Intramuscular TID  PRN White, Patrice L, NP       OLANZapine  zydis (ZYPREXA ) disintegrating tablet 5 mg  5 mg Oral TID PRN White, Patrice L, NP       thiamine  (VITAMIN B1) tablet 100 mg  100 mg Oral Daily White, Patrice L, NP   100 mg at 12/25/23 1610    Lab Results:  No results found for this or any previous visit (from the past 48 hours).   Blood Alcohol level:  Lab Results  Component Value Date   ETH 141 (H) 12/21/2023   ETH 214 (H) 10/29/2020    Metabolic Disorder Labs: Lab Results  Component Value Date   HGBA1C 6.1 (H) 08/09/2022   MPG 128 08/09/2022   MPG 145.59 10/31/2020   No results found for: "PROLACTIN" Lab Results  Component Value Date   CHOL 269 (H) 11/09/2023   TRIG 105 11/09/2023   HDL 96 11/09/2023   CHOLHDL 2.8 11/09/2023   LDLCALC 155 (H) 11/09/2023   LDLCALC 139 (H) 05/24/2017    Physical Findings: AIMS:  , ,  ,  ,    CIWA:  CIWA-Ar Total: 3 COWS:      Psychiatric Specialty Exam:  Presentation  General Appearance:  Appropriate for Environment; Casual  Eye Contact: Fair  Speech: Clear and Coherent  Speech Volume: Normal    Mood and Affect  Mood: Anxious  Affect: Appropriate   Thought Process  Thought Processes: Coherent  Descriptions of Associations:Intact  Orientation:Full (Time, Place and Person)  Thought Content:Logical  Hallucinations: Denies  Ideas of Reference:None  Suicidal Thoughts: Denies  Homicidal Thoughts: Denies   Sensorium  Memory: Immediate Fair; Recent Fair; Remote Fair  Judgment: Impaired  Insight: Shallow   Executive Functions  Concentration: Poor  Attention Span: Poor  Recall: Poor  Fund of  Knowledge: Fair  Language: Fair   Psychomotor Activity  Psychomotor Activity: No data recorded  Musculoskeletal: Strength & Muscle Tone: within normal limits Gait & Station: normal Assets  Assets: Manufacturing systems engineer; Social Support; Resilience    Physical Exam: Physical Exam Vitals and nursing note reviewed.  HENT:     Head: Normocephalic.     Nose: Nose normal.     Mouth/Throat:     Mouth: Mucous membranes are moist.  Cardiovascular:     Rate and Rhythm: Normal rate.  Pulmonary:     Effort: Pulmonary effort is normal.  Skin:    General: Skin is warm.  Neurological:     Mental Status: He is alert.    ROS Blood pressure 110/70, pulse 79, temperature 98.4 F (36.9 C), resp. rate 17, height 6\' 2"  (1.88 m), weight 86.4 kg, SpO2 92%. Body mass index is 24.46 kg/m.  Diagnosis: Principal Problem:   MDD (major depressive disorder) Moderate Alcohol use disorder,sever Sedative hypnotic use disorder  Clinical Decision Making: Patient with history of anxiety, alcohol use disorder, chronic benzo use as prescribed is currently admitted to inpatient psychiatric unit for detoxing from alcohol and chronic benzos use.  Patient is currently tolerating Librium  taper fairly well.   Treatment Plan Summary:   Safety and Monitoring:             -- Voluntary admission to inpatient psychiatric unit for safety, stabilization and treatment             -- Daily contact with patient to assess and evaluate symptoms and progress in treatment             -- Patient's case to be discussed in  multi-disciplinary team meeting             -- Observation Level: q15 minute checks             -- Vital signs:  q12 hours             -- Precautions: suicide, elopement, and assault   2. Psychiatric Diagnoses and Treatment:               Librium  taper Increased Lexapro  to 10 mg-will plan to titrate it up for optimization of anxiety symptoms control gabapentin  200 mg twice daily to help with  chronic alcoholism/benzo withdrawal upon discharge.   -- The risks/benefits/side-effects/alternatives to this medication were discussed in detail with the patient and time was given for questions. The patient consents to medication trial.                -- Metabolic profile and EKG monitoring obtained while on an atypical antipsychotic (BMI: Lipid Panel: HbgA1c: QTc:)              -- Encouraged patient to participate in unit milieu and in scheduled group therapies                            3. Medical Issues Being Addressed:      4. Discharge Planning:              -- Social work and case management to assist with discharge planning and identification of hospital follow-up needs prior to discharge             -- Estimated LOS: 5-7 days             -- Discharge Concerns: Need to establish a safety plan; Medication compliance and effectiveness             -- Discharge Goals: Return home with outpatient referrals follow ups  Aurelia Blotter, MD 12/25/2023, 3:39 PM

## 2023-12-25 NOTE — Plan of Care (Signed)
   Problem: Education: Goal: Emotional status will improve Outcome: Progressing Goal: Mental status will improve Outcome: Progressing   Problem: Safety: Goal: Periods of time without injury will increase Outcome: Progressing

## 2023-12-25 NOTE — Progress Notes (Signed)
 Patient pleasant and cooperative.  Denies SI/HI and AVH.  Denies anxiety and depression.  Denies pain.  Reports he had difficulty falling asleep last night.  Compliant with scheduled medications.  15 min checks in place for safety.  Patient isolates to room with the exception of meals.  Minimal interaction with peers.  Appropriate interaction with staff.

## 2023-12-25 NOTE — Group Note (Signed)
 Date:  12/25/2023 Time:  10:03 PM  Group Topic/Focus:  Wrap-Up Group:   The focus of this group is to help patients review their daily goal of treatment and discuss progress on daily workbooks.    Participation Level:  Did Not Attend  Participation Quality:     Affect:     Cognitive:     Insight: None  Engagement in Group:  None  Modes of Intervention:     Additional Comments:    Rolland Cline 12/25/2023, 10:03 PM

## 2023-12-25 NOTE — Group Note (Deleted)
 Date:  12/25/2023 Time:  9:45 PM  Group Topic/Focus:  Wrap-Up Group:   The focus of this group is to help patients review their daily goal of treatment and discuss progress on daily workbooks.     Participation Level:  {BHH PARTICIPATION EAVWU:98119}  Participation Quality:  {BHH PARTICIPATION QUALITY:22265}  Affect:  {BHH AFFECT:22266}  Cognitive:  {BHH COGNITIVE:22267}  Insight: {BHH Insight2:20797}  Engagement in Group:  {BHH ENGAGEMENT IN JYNWG:95621}  Modes of Intervention:  {BHH MODES OF INTERVENTION:22269}  Additional Comments:  ***  Maglione,Preciosa Bundrick E 12/25/2023, 9:45 PM

## 2023-12-25 NOTE — Group Note (Signed)
 Recreation Therapy Group Note   Group Topic:Health and Wellness  Group Date: 12/25/2023 Start Time: 1400 End Time: 1445 Facilitators: Deatrice Factor, LRT, CTRS Location: Dayroom  Activity Description/Intervention: Therapeutic Drumming. Patients with peers and staff were given the opportunity to engage in a leader facilitated Health[employer]HYTH[facility] Group Empowerment Drumming Circle with staff from the [employer], in partnership with [employer]. Teaching laboratory technician and trained [employer], Kathlyne Parchment leading with LRT observing and documenting intervention and pt response. This evidenced-based practice targets 7 areas of health and wellbeing in the human experience including: stress-reduction, exercise, self-expression, camaraderie/support, nurturing, spirituality, and music-making (leisure).    Goal Area(s) Addresses:  Patient will engage in pro-social way in music group.  Patient will follow directions of drum leader on the first prompt. Patient will demonstrate no behavioral issues during group.  Patient will identify if a reduction in stress level occurs as a result of participation in therapeutic drum circle.     Education: Leisure exposure, Pharmacologist, Musical expression, Discharge Planning   Affect/Mood: N/A   Participation Level: Did not attend    Clinical Observations/Individualized Feedback: Patient did not attend group.   Plan: Continue to engage patient in RT group sessions 2-3x/week.   Deatrice Factor, LRT, CTRS 12/25/2023 4:36 PM

## 2023-12-25 NOTE — Group Note (Signed)
 Date:  12/25/2023 Time:  10:14 AM  Group Topic/Focus:  Unit expectations and House keeping rules.    Participation Level:  Did Not Attend   Eduardo Watts 12/25/2023, 10:14 AM

## 2023-12-26 DIAGNOSIS — F331 Major depressive disorder, recurrent, moderate: Secondary | ICD-10-CM | POA: Diagnosis not present

## 2023-12-26 NOTE — Progress Notes (Signed)
 [facility] MD Progress Note  12/26/2023 9:25 PM Eduardo Watts.  MRN:  [id number]  Eduardo Defina. is a 78 y.o. male with a past psychiatric history significant for MDD, GAD, insomnia and a documented medical history of prediabetes, GERD, BPH who was transferred from the [facility] emergency department to the [facility] for alcohol and benzodiazepine (lorazepam ) detox Patient is admitted to [facility] psych unit with Q15 min safety monitoring. Multidisciplinary team approach is offered. Medication management; group/milieu therapy is offered. Subjective:  Chart reviewed, case discussed in multidisciplinary meeting, patient seen during rounds.   Patient is noted to be seen briefly in the hallway initially in the morning and patient did not endorse any complaints and reports doing well and did not endorse any SI/HI/hallucinations.  Later on when provider went to check the patient he was in deep sleep he did not respond to verbal commands.  Patient is later on noted to be participating in groups.  Per nursing report patient is taking his medications with no reported side effects.  Sleep: Fair  Appetite:  Fair  Past Psychiatric History: see h&P Family History:  Family History  Problem Relation Age of Onset   Dementia Mother    Stroke Mother    Cancer - Prostate Father    Melanoma Father    Hyperlipidemia Sister        high cholestrol   Depression Brother    Suicidality Brother    Lung disease Maternal Grandfather    Heart attack Neg Hx    Hypertension Neg Hx    Social History:  Social History   Substance and Sexual Activity  Alcohol Use Yes   Alcohol/week: 14.0 - 21.0 standard drinks of alcohol   Types: 14 - 21 Glasses of wine per week   Comment: bottle of wine a day     Social History   Substance and Sexual Activity  Drug Use No    Social History   Socioeconomic History   Marital status: Married    Spouse name: Not on file   Number of  children: 1   Years of education: 17   Highest education level: Not on file  Occupational History   Not on file  Tobacco Use   Smoking status: Never    Passive exposure: Never   Smokeless tobacco: Never  Vaping Use   Vaping status: Never Used  Substance and Sexual Activity   Alcohol use: Yes    Alcohol/week: 14.0 - 21.0 standard drinks of alcohol    Types: 14 - 21 Glasses of wine per week    Comment: bottle of wine a day   Drug use: No   Sexual activity: Not Currently  Other Topics Concern   Not on file  Social History Narrative   Never smoked   Alcohol yes -2-3 glasses of wine/night   Caffeine- yes 1 cup of coffee/day   Recreational drug use -No   Diet- fruits and vegetables,less fried food more pasta   Exercise- decrease to fatigue ,does yard work,and  walks occasionally    Occupation -employed Airline pilot   Married 1 daughter/ 1 granddaughter   Goes by [employer]" loves animals   Social Drivers of Corporate investment banker Strain: Not on file  Food Insecurity: No Food Insecurity (12/21/2023)   Hunger Vital Sign    Worried About Running Out of Food in the Last Year: Never true    Ran Out of Food in the Last Year:  Never true  Recent Concern: Food Insecurity - Food Insecurity Present (12/21/2023)   Hunger Vital Sign    Worried About Running Out of Food in the Last Year: Sometimes true    Ran Out of Food in the Last Year: Never true  Transportation Needs: No Transportation Needs (12/21/2023)   PRAPARE - Administrator, Civil Service (Medical): No    Lack of Transportation (Non-Medical): No  Physical Activity: Not on file  Stress: Not on file  Social Connections: Socially Integrated (12/21/2023)   Social Connection and Isolation Panel [NHANES]    Frequency of Communication with Friends and Family: Three times a week    Frequency of Social Gatherings with Friends and Family: Twice a week    Attends Religious Services: More than 4 times per year    Active Member of [employer]  or Organizations: Yes    Attends Engineer, structural: More than 4 times per year    Marital Status: Married   Past Medical History:  Past Medical History:  Diagnosis Date   Anxiety    Asthma    as a child   Depression    sees Dr Toi Foster   History of BPH    Dr.Dahlstedt   Hyperlipidemia    Pre-diabetes    Prediabetes    Sleep disturbances     Past Surgical History:  Procedure Laterality Date   CIRCUMCISION     LEFT HEART CATHETERIZATION WITH CORONARY ANGIOGRAM N/A 04/30/2014   Procedure: LEFT HEART CATHETERIZATION WITH CORONARY ANGIOGRAM;  Surgeon: Lucendia Rusk, MD;  Location: [facility] CATH LAB;  Service: Cardiovascular;  Laterality: N/A;   NASAL SEPTUM SURGERY  1975   R cheek benign tumor      1992, again 1998.   TONSILLECTOMY     TRANSURETHRAL RESECTION OF PROSTATE N/A 08/17/2022   Procedure: TRANSURETHRAL RESECTION OF THE PROSTATE (TURP);  Surgeon: Trent Frizzle, MD;  Location: [facility] ORS;  Service: Urology;  Laterality: N/A;  90 MINS    Current Medications: Current Facility-Administered Medications  Medication Dose Route Frequency Provider Last Rate Last Admin   acetaminophen  (TYLENOL ) tablet 650 mg  650 mg Oral Q6H PRN White, Patrice L, NP   650 mg at 12/23/23 1009   alum & mag hydroxide-simeth (MAALOX/MYLANTA) 200-200-20 MG/5ML suspension 30 mL  30 mL Oral Q4H PRN White, Patrice L, NP       escitalopram  (LEXAPRO ) tablet 10 mg  10 mg Oral Daily Lamija Besse, MD   10 mg at 12/26/23 6962   fluticasone  (FLONASE ) 50 MCG/ACT nasal spray 1 spray  1 spray Each Nare Daily Aurelia Blotter, MD   1 spray at 12/26/23 9528   gabapentin  (NEURONTIN ) capsule 200 mg  200 mg Oral BID Mishael Krysiak, MD   200 mg at 12/26/23 0950   hydrOXYzine  (ATARAX ) tablet 25 mg  25 mg Oral TID PRN Nickola Baron L, NP   25 mg at 12/25/23 2209   lactulose  (CHRONULAC ) 10 GM/15ML solution 10 g  10 g Oral BID PRN Harve Spradley, MD   10 g at 12/24/23 2239   magnesium  hydroxide (MILK OF  MAGNESIA) suspension 30 mL  30 mL Oral Daily PRN White, Patrice L, NP   30 mL at 12/24/23 1546   multivitamin with minerals tablet 1 tablet  1 tablet Oral Daily White, Patrice L, NP   1 tablet at 12/26/23 4132   nystatin  (MYCOSTATIN /NYSTOP ) topical powder   Topical BID PRN Xan Ingraham, MD   1 Application at 12/25/23  2202   OLANZapine  (ZYPREXA ) injection 5 mg  5 mg Intramuscular TID PRN White, Patrice L, NP       OLANZapine  zydis (ZYPREXA ) disintegrating tablet 5 mg  5 mg Oral TID PRN White, Patrice L, NP   5 mg at 12/25/23 2209   thiamine  (VITAMIN B1) tablet 100 mg  100 mg Oral Daily White, Patrice L, NP   100 mg at 12/26/23 1191    Lab Results:  No results found for this or any previous visit (from the past 48 hours).   Blood Alcohol level:  Lab Results  Component Value Date   ETH 141 (H) 12/21/2023   ETH 214 (H) 10/29/2020    Metabolic Disorder Labs: Lab Results  Component Value Date   HGBA1C 6.1 (H) 08/09/2022   MPG 128 08/09/2022   MPG 145.59 10/31/2020   No results found for: "PROLACTIN" Lab Results  Component Value Date   CHOL 269 (H) 11/09/2023   TRIG 105 11/09/2023   HDL 96 11/09/2023   CHOLHDL 2.8 11/09/2023   LDLCALC 155 (H) 11/09/2023   LDLCALC 139 (H) 05/24/2017    Physical Findings: AIMS:  , ,  ,  ,    CIWA:  CIWA-Ar Total: 0 COWS:      Psychiatric Specialty Exam:  Presentation  General Appearance:  Appropriate for Environment; Casual  Eye Contact: Fair  Speech: Clear and Coherent  Speech Volume: Normal    Mood and Affect  Mood: fine Affect: Appropriate   Thought Process  Thought Processes: Coherent  Descriptions of Associations:Intact  Orientation:Full (Time, Place and Person)  Thought Content:Logical  Hallucinations: Denies  Ideas of Reference:None  Suicidal Thoughts: Denies  Homicidal Thoughts: Denies   Sensorium  Memory: Immediate Fair; Recent Fair; Remote  Fair  Judgment: Impaired  Insight: Shallow   Executive Functions  Concentration: Poor  Attention Span: Poor  Recall: Poor  Fund of Knowledge: Fair  Language: Fair   Psychomotor Activity  Psychomotor Activity: No data recorded  Musculoskeletal: Strength & Muscle Tone: within normal limits Gait & Station: normal Assets  Assets: Manufacturing systems engineer; Social Support; Resilience    Physical Exam: Physical Exam Vitals and nursing note reviewed.  HENT:     Head: Normocephalic.     Nose: Nose normal.     Mouth/Throat:     Mouth: Mucous membranes are moist.  Cardiovascular:     Rate and Rhythm: Normal rate.  Pulmonary:     Effort: Pulmonary effort is normal.  Skin:    General: Skin is warm.  Neurological:     Mental Status: He is alert.    ROS Blood pressure 128/75, pulse 70, temperature 97.6 F (36.4 C), resp. rate 17, height 6\' 2"  (1.88 m), weight 86.4 kg, SpO2 97%. Body mass index is 24.46 kg/m.  Diagnosis: Principal Problem:   MDD (major depressive disorder) Moderate Alcohol use disorder,sever Sedative hypnotic use disorder  Clinical Decision Making: Patient with history of anxiety, alcohol use disorder, chronic benzo use as prescribed is currently admitted to inpatient psychiatric unit for detoxing from alcohol and chronic benzos use.  Patient is currently tolerating Librium  taper fairly well.   Treatment Plan Summary:   Safety and Monitoring:             -- Voluntary admission to inpatient psychiatric unit for safety, stabilization and treatment             -- Daily contact with patient to assess and evaluate symptoms and progress in treatment             --  Patient's case to be discussed in multi-disciplinary team meeting             -- Observation Level: q15 minute checks             -- Vital signs:  q12 hours             -- Precautions: suicide, elopement, and assault   2. Psychiatric Diagnoses and Treatment:               Librium   taper Increased Lexapro  to 10 mg-will plan to titrate it up for optimization of anxiety symptoms control gabapentin  200 mg twice daily to help with chronic alcoholism/benzo withdrawal upon discharge.   -- The risks/benefits/side-effects/alternatives to this medication were discussed in detail with the patient and time was given for questions. The patient consents to medication trial.                -- Metabolic profile and EKG monitoring obtained while on an atypical antipsychotic (BMI: Lipid Panel: HbgA1c: QTc:)              -- Encouraged patient to participate in unit milieu and in scheduled group therapies                            3. Medical Issues Being Addressed:      4. Discharge Planning:              -- Social work and case management to assist with discharge planning and identification of hospital follow-up needs prior to discharge             -- Estimated LOS: 5-7 days             -- Discharge Concerns: Need to establish a safety plan; Medication compliance and effectiveness             -- Discharge Goals: Return home with outpatient referrals follow ups  Aurelia Blotter, MD 12/26/2023, 9:25 PM

## 2023-12-26 NOTE — Group Note (Signed)
 Date:  12/26/2023 Time:  11:47 AM  Group Topic/Focus:  Chair exercises The purpose of this group is to allow patient to move their bodies while doing easy exercise movements    Participation Level:  Active  Participation Quality:  Appropriate  Affect:  Appropriate  Cognitive:  Appropriate  Insight: Appropriate  Engagement in Group:  Engaged  Modes of Intervention:  Activity  Additional Comments:    Linnell Richardson 12/26/2023, 11:47 AM

## 2023-12-26 NOTE — Plan of Care (Signed)
  Problem: Safety: Goal: Ability to remain free from injury will improve Outcome: Progressing   Problem: Education: Goal: Emotional status will improve Outcome: Progressing

## 2023-12-26 NOTE — Group Note (Signed)
 Date:  12/26/2023 Time:  9:20 PM  Group Topic/Focus:  Self Care:   The focus of this group is to help patients understand the importance of self-care in order to improve or restore emotional, physical, spiritual, interpersonal, and financial health.    Participation Level:  Did Not Attend  Participation Quality:  Did Not Attend  Affect:  Did Not Attend  Cognitive:  Did Not Attend  Insight: None  Engagement in Group:  None  Modes of Intervention:  Did Not Attend  Additional Comments:    Sherlie Distance 12/26/2023, 9:20 PM

## 2023-12-26 NOTE — Progress Notes (Signed)
 Patient is a voluntary admission to [facility] for MDD with detox from etoh and ativan .  CIWA has been negative 48 hrs with order discontinued.  Patient is cooperative with staff and mostly isolates in his room except for meals.  Denies SI, HI, AVH, anxiety and depression.  Patient is scheduled for discharge tomorrow.  Will continue to monitor.

## 2023-12-26 NOTE — Group Note (Signed)
 Physical/Occupational Therapy Group Note  Group Topic: Functional, Dynamic Balance   Group Date: 12/26/2023 Start Time: 1300 End Time: 1340 Facilitators: Shree Espey, Otelia Blew, PT   Group Description: Group discussed impact of balance on safety and independence with functional tasks.  Identified and discussed any self-perceived balance deficits to personalize information.  Discussed and reviewed strategies to address/improve balance deficits: use of assist devices, activity pacing/energy conservation, environment/home safety modifications, focusing attention/minimizing distraction.  Reviewed and participated with standing LE therex designed to target dynamic balance reactions and LE strength/stability; provided handouts with HEP to be utilized outside of group time as appropriate.  Allowed time for questions and further discussion on any balance or mobility concerns/needs.  Therapeutic Goal(s):  Identify and discuss any individual balance deficits and functional implications. Identify and discuss any environmental/home safety modifications that can optimize balance and safety for mobility within the home. Demonstrate understanding and performance of standing therex designed to target dynamic balance deficits.  Individual Participation: Did not attend  Participation Level:   Participation Quality:   Behavior:   Speech/Thought Process:   Affect/Mood:   Insight:   Judgement:   Modes of Intervention:   Patient Response to Interventions:    Plan: Continue to engage patient in PT/OT groups 1 - 2x/week.  Lavenia Post PT, DPT 12/26/23, 1:59 PM

## 2023-12-27 DIAGNOSIS — F331 Major depressive disorder, recurrent, moderate: Secondary | ICD-10-CM | POA: Diagnosis not present

## 2023-12-27 MED ORDER — ESCITALOPRAM OXALATE 10 MG PO TABS: 10.0000 mg | ORAL_TABLET | Freq: Every day | ORAL | 0 refills | Status: AC

## 2023-12-27 MED ORDER — GABAPENTIN 100 MG PO CAPS: 200.0000 mg | ORAL_CAPSULE | Freq: Two times a day (BID) | ORAL | 0 refills | Status: AC

## 2023-12-27 MED ORDER — ADULT MULTIVITAMIN W/MINERALS CH: 1.0000 | ORAL_TABLET | Freq: Every day | ORAL | 0 refills | Status: AC

## 2023-12-27 MED ORDER — LACTULOSE 10 GM/15ML PO SOLN: 10.0000 g | Freq: Two times a day (BID) | ORAL | 0 refills | Status: DC | PRN

## 2023-12-27 MED ORDER — VITAMIN B-1 100 MG PO TABS: 100.0000 mg | ORAL_TABLET | Freq: Every day | ORAL | 0 refills | Status: AC

## 2023-12-27 MED ORDER — NYSTATIN 100000 UNIT/GM EX POWD: Freq: Two times a day (BID) | CUTANEOUS | 0 refills | Status: AC | PRN

## 2023-12-27 NOTE — Progress Notes (Signed)
 Pt's wife visited and her interaction with him was positive and supportive.  During her departure, she toke home Pt's personal belonging that was in the storage.  After Pt's wife left the unit, he returned into his room.  Mood depressed and affect flat.  Pt endorsed anxiety rated moderate - took Atarax .  At about 0405, Pt is awake and requesting sleep aid.  Pt encouraged to relax, support provided.  Pt denied SI/HI and AVH.    12/26/23 2300  Psych Admission Type (Psych Patients Only)  Admission Status Voluntary  Psychosocial Assessment  Patient Complaints None  Eye Contact Fair  Facial Expression Animated  Affect Appropriate to circumstance  Speech Logical/coherent  Interaction Assertive  Motor Activity Slow  Appearance/Hygiene In scrubs  Behavior Characteristics Cooperative;Appropriate to situation  Mood Pleasant  Thought Process  Coherency WDL  Content WDL  Delusions None reported or observed  Perception WDL  Hallucination None reported or observed  Judgment WDL  Confusion None  Danger to Self  Current suicidal ideation? Denies  Agreement Not to Harm Self Yes  Description of Agreement Verbal  Danger to Others  Danger to Others None reported or observed  Danger to Others Abnormal  Harmful Behavior to others No threats or harm toward other people   Problem: Safety: Goal: Ability to remain free from injury will improve Outcome: Progressing   Problem: Health Behavior/Discharge Planning: Goal: Ability to identify changes in lifestyle to reduce recurrence of condition will improve Outcome: Progressing   Problem: Physical Regulation: Goal: Complications related to the disease process, condition or treatment will be avoided or minimized Outcome: Progressing  roblem: Education: Goal: Knowledge of disease or condition will improve Outcome: Progressing Goal: Understanding of discharge needs will improve Outcome: Progressing

## 2023-12-27 NOTE — [facility] Suicide Risk Assessment (Signed)
 [facility] INPATIENT:  Family/Significant Other Suicide Prevention Education  Suicide Prevention Education:  Patient Refusal for Family/Significant Other Suicide Prevention Education: The patient Eduardo Watts. has refused to provide written consent for family/significant other to be provided Family/Significant Other Suicide Prevention Education during admission and/or prior to discharge.  Physician notified.  Claudio Culver 12/27/2023, 11:37 AM

## 2023-12-27 NOTE — Plan of Care (Addendum)
 Patient is scheduled for discharge today with his wife expected for transport.  Went over discharge instructions with patient.  Belongings gathered in room by patient.  Denies SI, HI, AVH, anxiety and depression.  Will continue to monitor.  Patient escorted to the medical mall exit by [facility] with belongings and discharge paperwork.

## 2023-12-27 NOTE — [facility] Counselor (Signed)
 Pt given blank IM due to IM not being scanned in by medical records at this time.   Derrill Flirt, MSW, [place] 12/27/2023 11:37 AM

## 2023-12-27 NOTE — Discharge Summary (Signed)
 Physician Discharge Summary Note  Patient:  Eduardo Watts. is an 78 y.o., male MRN:  [id number] DOB:  [birth year] Patient phone:  [phone] (home)  Patient address:   [address] [place] [address],    Date of Admission:  12/21/2023 Date of Discharge: 01/01/24  Reason for Admission:  Eduardo Watts. is a 78 y.o. male with a past psychiatric history significant for MDD, GAD, insomnia and a documented medical history of prediabetes, GERD, BPH who was transferred from the [facility] emergency department to the [facility] for alcohol and benzodiazepine (lorazepam ) detox Patient is admitted to [facility] psych unit with Q15 min safety monitoring. Multidisciplinary team approach is offered. Medication management; group/milieu therapy is offered.   Principal Problem: MDD (major depressive disorder) Discharge Diagnoses: Principal Problem:   MDD (major depressive disorder)   Past Psychiatric History: see h&P  Family Psychiatric  History: see h&p Social History:  Social History   Substance and Sexual Activity  Alcohol Use Yes   Alcohol/week: 14.0 - 21.0 standard drinks of alcohol   Types: 14 - 21 Glasses of wine per week   Comment: bottle of wine a day     Social History   Substance and Sexual Activity  Drug Use No    Social History   Socioeconomic History   Marital status: Married    Spouse name: Not on file   Number of children: 1   Years of education: 17   Highest education level: Not on file  Occupational History   Not on file  Tobacco Use   Smoking status: Never    Passive exposure: Never   Smokeless tobacco: Never  Vaping Use   Vaping status: Never Used  Substance and Sexual Activity   Alcohol use: Yes    Alcohol/week: 14.0 - 21.0 standard drinks of alcohol    Types: 14 - 21 Glasses of wine per week    Comment: bottle of wine a day   Drug use: No   Sexual activity: Not Currently  Other Topics Concern   Not on  file  Social History Narrative   Never smoked   Alcohol yes -2-3 glasses of wine/night   Caffeine- yes 1 cup of coffee/day   Recreational drug use -No   Diet- fruits and vegetables,less fried food more pasta   Exercise- decrease to fatigue ,does yard work,and  walks occasionally    Occupation -employed Airline pilot   Married 1 daughter/ 1 granddaughter   Goes by [employer]" loves animals   Social Drivers of Corporate investment banker Strain: Not on file  Food Insecurity: No Food Insecurity (12/21/2023)   Hunger Vital Sign    Worried About Running Out of Food in the Last Year: Never true    Ran Out of Food in the Last Year: Never true  Recent Concern: Food Insecurity - Food Insecurity Present (12/21/2023)   Hunger Vital Sign    Worried About Running Out of Food in the Last Year: Sometimes true    Ran Out of Food in the Last Year: Never true  Transportation Needs: No Transportation Needs (12/21/2023)   PRAPARE - Administrator, Civil Service (Medical): No    Lack of Transportation (Non-Medical): No  Physical Activity: Not on file  Stress: Not on file  Social Connections: Socially Integrated (12/21/2023)   Social Connection and Isolation Panel [NHANES]    Frequency of Communication with Friends and Family: Three times a week  Frequency of Social Gatherings with Friends and Family: Twice a week    Attends Religious Services: More than 4 times per year    Active Member of [employer] or Organizations: Yes    Attends Engineer, structural: More than 4 times per year    Marital Status: Married   Past Medical History:  Past Medical History:  Diagnosis Date   Anxiety    Asthma    as a child   Depression    sees Dr Toi Foster   History of BPH    Dr.Dahlstedt   Hyperlipidemia    Pre-diabetes    Prediabetes    Sleep disturbances     Past Surgical History:  Procedure Laterality Date   CIRCUMCISION     LEFT HEART CATHETERIZATION WITH CORONARY ANGIOGRAM N/A 04/30/2014    Procedure: LEFT HEART CATHETERIZATION WITH CORONARY ANGIOGRAM;  Surgeon: Lucendia Rusk, MD;  Location: [facility] CATH LAB;  Service: Cardiovascular;  Laterality: N/A;   NASAL SEPTUM SURGERY  1975   R cheek benign tumor      1992, again 1998.   TONSILLECTOMY     TRANSURETHRAL RESECTION OF PROSTATE N/A 08/17/2022   Procedure: TRANSURETHRAL RESECTION OF THE PROSTATE (TURP);  Surgeon: Trent Frizzle, MD;  Location: [facility] ORS;  Service: Urology;  Laterality: N/A;  47 MINS   Family History:  Family History  Problem Relation Age of Onset   Dementia Mother    Stroke Mother    Cancer - Prostate Father    Melanoma Father    Hyperlipidemia Sister        high cholestrol   Depression Brother    Suicidality Brother    Lung disease Maternal Grandfather    Heart attack Neg Hx    Hypertension Neg Hx     Hospital Course:  Eduardo Watts. is a 78 y.o. male with a past psychiatric history significant for MDD, GAD, insomnia and a documented medical history of prediabetes, GERD, BPH who was transferred from the [facility] emergency department to the [facility] for alcohol and benzodiazepine (lorazepam ) detox Patient is admitted to [facility] psych unit with Q15 min safety monitoring. Multidisciplinary team approach is offered. Medication management; group/milieu therapy is offered.  On admission patient was started on CIWA protocol with Librium  taper.  He was started on Lexapro  5 mg in the ED and it was titrated to 10 mg daily on the unit to help with the anxiety.  Gabapentin  was initiated to help with chronic alcoholism.  He had constipation that was helped with lactulose  and a rash that was helped with nystatin  cream.  No other consults were placed.  Patient tolerated the detox very well and maintain safe behaviors on the unit.  He participated in the groups and engaged with the treatment team and postdischarge planning.  On the day of discharge patient denies SI/HI/plan and  denies auditory/visual hallucinations.  He remains future oriented and is willing to participate in outpatient mental health services.  Provider has reached out to his wife and updated on the progress patient has made on the unit and the post discharge planning.  Physical Findings: AIMS:  , ,  ,  ,    CIWA:  CIWA-Ar Total: 0 COWS:        Psychiatric Specialty Exam:  Presentation  General Appearance:  Appropriate for Environment  Eye Contact: Fair  Speech: Clear and Coherent  Speech Volume: Normal    Mood and Affect  Mood: Euthymic  Affect: Appropriate   Thought Process  Thought Processes: Coherent  Descriptions of Associations:Intact  Orientation:Full (Time, Place and Person)  Thought Content:Logical  Hallucinations:Hallucinations: None  Ideas of Reference:None  Suicidal Thoughts:Suicidal Thoughts: No  Homicidal Thoughts:Homicidal Thoughts: No   Sensorium  Memory: Immediate Fair; Recent Fair; Remote Fair  Judgment: Fair  Insight: Fair   Art therapist  Concentration: Fair  Attention Span: Fair  Recall: [employer] of Knowledge: Fair  Language: Fair   Psychomotor Activity  Psychomotor Activity: Psychomotor Activity: Normal  Musculoskeletal: Strength & Muscle Tone: within normal limits Gait & Station: normal Assets  Assets: Manufacturing systems engineer; Desire for Improvement; Social Support   Sleep  Sleep: Sleep: Fair    Physical Exam: Physical Exam ROS Blood pressure 106/62, pulse 74, temperature 98.8 F (37.1 C), resp. rate 16, height 6\' 2"  (1.88 m), weight 86.4 kg, SpO2 93%. Body mass index is 24.46 kg/m.   Social History   Tobacco Use  Smoking Status Never   Passive exposure: Never  Smokeless Tobacco Never   Tobacco Cessation:  N/A, patient does not currently use tobacco products   Blood Alcohol level:  Lab Results  Component Value Date   ETH 141 (H) 12/21/2023   ETH 214 (H) 10/29/2020     Metabolic Disorder Labs:  Lab Results  Component Value Date   HGBA1C 6.1 (H) 08/09/2022   MPG 128 08/09/2022   MPG 145.59 10/31/2020   No results found for: "PROLACTIN" Lab Results  Component Value Date   CHOL 269 (H) 11/09/2023   TRIG 105 11/09/2023   HDL 96 11/09/2023   CHOLHDL 2.8 11/09/2023   LDLCALC 155 (H) 11/09/2023   LDLCALC 139 (H) 05/24/2017    See Psychiatric Specialty Exam and Suicide Risk Assessment completed by Attending Physician prior to discharge.  Discharge destination:  Home  Is patient on multiple antipsychotic therapies at discharge:  No   Has Patient had three or more failed trials of antipsychotic monotherapy by history:  No  Recommended Plan for Multiple Antipsychotic Therapies: NA  Discharge Instructions     Diet - low sodium heart healthy   Complete by: As directed    Increase activity slowly   Complete by: As directed       Allergies as of 12/27/2023       Reactions   Simvastatin Other (See Comments)   Muscle aches   Lorazepam  Other (See Comments)   Dizzy/unsteady   Trazodone And Nefazodone Other (See Comments)   "crazy"        Medication List     TAKE these medications      Indication  escitalopram  10 MG tablet Commonly known as: LEXAPRO  Take 1 tablet (10 mg total) by mouth daily.    fluticasone  50 MCG/ACT nasal spray Commonly known as: FLONASE  Place 1 spray into both nostrils daily.    gabapentin  100 MG capsule Commonly known as: NEURONTIN  Take 2 capsules (200 mg total) by mouth 2 (two) times daily.    lactulose  10 GM/15ML solution Commonly known as: CHRONULAC  Take 15 mLs (10 g total) by mouth 2 (two) times daily as needed for moderate constipation.    multivitamin with minerals Tabs tablet Take 1 tablet by mouth daily.    nystatin  powder Commonly known as: MYCOSTATIN /NYSTOP  Apply topically 2 (two) times daily as needed (For rash).    thiamine  100 MG tablet Commonly known as: Vitamin B-1 Take 1 tablet  (100 mg total) by mouth daily.  Follow-up Information     [facility] Follow up.   Specialty: Behavioral Health Why: Appointment is scheduled for 01/30/2024 at 10:30AM, appointment is face to face. Contact information: [address] [place]  [address] [phone]                Follow-up recommendations:  Activity:  As tolerated    Signed: Travian Kerner, MD 12/27/2023, 7:28 AM

## 2023-12-27 NOTE — Care Management Important Message (Signed)
 Important Message  Patient Details  Name: Eduardo Watts. MRN: [id number] Date of Birth: Sep 11, 1945   Important Message Given:  Yes - Medicare IM     Claudio Culver, LCSWA 12/27/2023, 11:34 AM

## 2023-12-27 NOTE — Progress Notes (Signed)
  [facility] Adult Case Management Discharge Plan :  Will you be returning to the same living situation after discharge:  Yes,  pt will return home  At discharge, do you have transportation home?: Yes,  pt's wife will pick him up  Do you have the ability to pay for your medications: Yes,  [employer] MEDICARE / [facility] MEDICARE  Release of information consent forms completed and in the chart;  Patient's signature needed at discharge.  Patient to Follow up at:  Follow-up Information     [facility] Follow up.   Specialty: Behavioral Health Why: Appointment is scheduled for 01/30/2024 at 10:30AM, appointment is face to face. Contact information: [address], [address] [place]  [address] [phone]                Next level of care provider has access to [facility] Link:no  Safety Planning and Suicide Prevention discussed: Yes,  CSW went over SPE with pt      Has patient been referred to the Quitline?: Patient does not use tobacco/nicotine products  Patient has been referred for addiction treatment: Patient refused referral for treatment.  [address] LCSWA 12/27/2023, 11:36 AM

## 2023-12-27 NOTE — BHH Suicide Risk Assessment (Signed)
 [facility] Discharge Suicide Risk Assessment   Principal Problem: MDD (major depressive disorder) Discharge Diagnoses: Principal Problem:   MDD (major depressive disorder)   Total Time spent with patient: 30 minutes  Musculoskeletal: Strength & Muscle Tone: within normal limits Gait & Station: normal Patient leans: N/A  Psychiatric Specialty Exam  Presentation  General Appearance:  Appropriate for Environment  Eye Contact: Fair  Speech: Clear and Coherent  Speech Volume: Normal  Handedness: Right   Mood and Affect  Mood: Euthymic  Duration of Depression Symptoms: No data recorded Affect: Appropriate   Thought Process  Thought Processes: Coherent  Descriptions of Associations:Intact  Orientation:Full (Time, Place and Person)  Thought Content:Logical  History of Schizophrenia/Schizoaffective disorder:No data recorded Duration of Psychotic Symptoms:No data recorded Hallucinations:Hallucinations: None  Ideas of Reference:None  Suicidal Thoughts:Suicidal Thoughts: No  Homicidal Thoughts:Homicidal Thoughts: No   Sensorium  Memory: Immediate Fair; Recent Fair; Remote Fair  Judgment: Fair  Insight: Fair   Art therapist  Concentration: Fair  Attention Span: Fair  Recall: [employer] of Knowledge: Fair  Language: Fair   Psychomotor Activity  Psychomotor Activity: Psychomotor Activity: Normal   Assets  Assets: Communication Skills; Desire for Improvement; Social Support   Sleep  Sleep: Sleep: Fair   Physical Exam: Physical Exam ROS Blood pressure 128/75, pulse 70, temperature 97.6 F (36.4 C), resp. rate 17, height 6\' 2"  (1.88 m), weight 86.4 kg, SpO2 97%. Body mass index is 24.46 kg/m.  Mental Status Per Nursing Assessment::   On Admission:  NA  Demographic Factors:  Male  Loss Factors: Decrease in vocational status  Historical Factors: Impulsivity  Risk Reduction Factors:   Living with another person,  especially a relative, Positive social support, Positive therapeutic relationship, and Positive coping skills or problem solving skills  Continued Clinical Symptoms:  Depression:   Comorbid alcohol abuse/dependence  Cognitive Features That Contribute To Risk:  None    Suicide Risk:  Minimal: No identifiable suicidal ideation.  Patients presenting with no risk factors but with morbid ruminations; may be classified as minimal risk based on the severity of the depressive symptoms   Follow-up Information     [facility] Follow up.   Specialty: Behavioral Health Why: Appointment is scheduled for 01/30/2024 at 10:30AM, appointment is face to face. Contact information: [address], [address] [place]  [address] [phone]                Plan Of Care/Follow-up recommendations:  Activity:  As tolerated  Aurelia Blotter, MD 12/27/2023, 7:27 AM

## 2023-12-27 NOTE — Group Note (Signed)
 Date:  12/27/2023 Time:  10:48 AM  Group Topic/Focus:  Fresh air therapyoutdoors.    Participation Level:  Did Not Attend  Eduardo Watts 12/27/2023, 10:48 AM

## 2024-01-22 DIAGNOSIS — G629 Polyneuropathy, unspecified: Secondary | ICD-10-CM | POA: Diagnosis not present

## 2024-01-22 DIAGNOSIS — E782 Mixed hyperlipidemia: Secondary | ICD-10-CM | POA: Diagnosis not present

## 2024-01-28 ENCOUNTER — Telehealth (HOSPITAL_COMMUNITY): Payer: Self-pay | Admitting: *Deleted

## 2024-01-28 ENCOUNTER — Telehealth: Payer: Self-pay | Admitting: Psychiatry

## 2024-01-28 NOTE — Telephone Encounter (Signed)
 Next appt is 04/03/24. Romero Theo Dolly called regarding her spouse, Zaniel Marineau. Bartl. Romero said on the 18th Romero thought he was drinking and had been hospitalized for alcohol. He has fallen three times while on Lexapro , broke his glasses and was encouraged to go to counseling. Romero is concerned about him. Her phone number is [phone].

## 2024-01-28 NOTE — Telephone Encounter (Signed)
 Schedule him to see me on 6/25 at 1130 am.  Let wife know and staff call pt to let him know of the appt.  I want to see him.

## 2024-01-28 NOTE — Telephone Encounter (Signed)
 Attempted to call patient regarding upcoming cardiac PET appointment. Left message on voicemail with name and callback number  Chantal Requena RN Navigator Cardiac Imaging [facility] [phone] Office [phone] Cell  Reminder to avoid caffeine prior to cardiac PET study.

## 2024-01-28 NOTE — Telephone Encounter (Signed)
 Pt was in the hospital for 7 days in May but did not go to rehab [employer] cover) and has not had counseling or been to AA. Wife reports he is drinking. He fell today, hit his head, and broke his glasses. He denied drinking, but wife found empty wine bottles in his car. She reports he continues to drive. She doesn't know what to do at this point and is asking for recommendations. His PCP is out vacation this week.

## 2024-01-28 NOTE — Telephone Encounter (Signed)
 Called wife and she said she was not able to talk. Told her I would call tomorrow.

## 2024-01-29 ENCOUNTER — Encounter (HOSPITAL_COMMUNITY): Payer: Self-pay

## 2024-01-29 ENCOUNTER — Ambulatory Visit (HOSPITAL_COMMUNITY)
Admission: RE | Admit: 2024-01-29 | Discharge: 2024-01-29 | Disposition: A | Discharge status: Home / Self Care | Source: Ambulatory Visit | Attending: Cardiology | Admitting: Cardiology

## 2024-01-29 MED ORDER — REGADENOSON 0.4 MG/5ML IV SOLN: 0.4000 mg | Freq: Once | INTRAVENOUS | Status: DC

## 2024-01-29 NOTE — Telephone Encounter (Signed)
 Scheduled apt 6/25 @ 1130. Called pt lvm to rtc

## 2024-01-29 NOTE — Telephone Encounter (Signed)
 Schedule him to see me on 6/25 at 1130 am.  Let wife know and staff call pt to let him know of the appt.  I want to see him.

## 2024-01-29 NOTE — Telephone Encounter (Signed)
 Wife informed that Dr. Geoffry wanted to see patient tomorrow, but that patient canceled appt.  Wife reported pt was to have a PET scan of his heart today and he went to the wrong location and came home, did not attempt to go to the right location.

## 2024-01-30 ENCOUNTER — Ambulatory Visit: Admitting: Psychiatry

## 2024-02-01 NOTE — Progress Notes (Deleted)
 HPI male never smoker followed for cough, mild obstructive airways disease, complicated by CAD, palpitations, old right rib fractures PFT 07/13/14- minimal obstructive airways disease, insignificant response to bronchodilator, normal lung volumes and diffusion. Echocardiogram 08/11/2013-normal LV function, normal valves, no evidence of prior MI Walk Test-04/02/2015-he walked 555 feet starting with saturation 93%, falling to a nadir of 92% on room air with no oxygen  desaturation. Pulse rate 100-109, regular. ONOX- 04/25/15- qualified for sleep O2 with 50 minutes<= 88% room air .NPSG 09/29/15- WNL, AHI 3/ hr, with desat to 85%, mean sat 90.1% ----------------------------------------------------------------------   02/02/23- 78 year old male never smoker followed for Chronic hypoxic respiratory / Nocturnal Hypoxemia, cough, mild Obstructive Airways Disease, old right rib fractures complicated by CAD, palpitations, Insomnia/ Anxiet/ ETOH/Depression Dr Geoffry, DM2, Hyperlipidemia, Neuropathy O2 2 L/sleep/[employer]  Covid vax-3 Phizer Body weight today-191 lbs -----Breathing is good.  Uses O2 about 4 nights/ week- takes it off about 2 AM. Discussed. Lorazepam  helps sleep. Peripheral neuropathy discomfort impacts sleep.  Had TURP.  02/04/24- 78 year old male never smoker followed for Chronic hypoxic respiratory / Nocturnal Hypoxemia, cough, mild Obstructive Airways Disease, old right rib fractures complicated by CAD, palpitations, Insomnia/ Anxiety/ ETOH/Depression Dr Geoffry, DM2, Hyperlipidemia, Neuropathy O2 2 L/sleep/[employer]  Body weight today-   ROS-see HPI  + = positive Constitutional:   No-   weight loss, night sweats, fevers, chills, fatigue, lassitude. HEENT:   No-  headaches, difficulty swallowing, tooth/dental problems, sore throat,       No-  sneezing, itching, ear ache, nasal congestion, post nasal drip,  CV:  No-   chest pain, orthopnea, PND, swelling in lower extremities, anasarca,                               +dizziness, +palpitations Resp: +shortness of breath with exertion or at rest.              No-   productive cough,  non-productive cough,  No- coughing up of blood.              No-   change in color of mucus.  No- wheezing.   Skin: No-   rash or lesions. GI:  No-   heartburn, indigestion, abdominal pain, nausea, vomiting,  GU: . MS:  No-   joint pain or swelling.   Neuro-     + numbness/ peripheral neuropathy Psych:  No- change in mood or affect. + depression or anxiety.  No memory loss.  OBJ- Physical Exam    General- Alert, Oriented, Affect-appropriate, Distress- none acute, not obese Skin- rash-none, lesions- none, excoriation- none Lymphadenopathy- none Head- atraumatic            Eyes- Gross vision intact, PERRLA, conjunctivae and secretions clear            Ears- + cerumen obstructing right canal            Nose- Clear, no-Septal dev, mucus, polyps, erosion, perforation             Throat- Mallampati II/+ uvulopalatoplasty , mucosa clear , drainage- none, tonsils-  atrophic,  Neck- flexible , trachea midline, no stridor , thyroid  nl, carotid no bruit Chest - symmetrical excursion , unlabored           Heart/CV- RRR , no murmur , no gallop  , no rub, nl s1 s2                           -  JVD- none , edema- none, stasis changes- none, varices- none           Lung- +clear, wheeze- none, cough- none , dullness-none, rub- none           Chest wall-; + asymmetry chest wall at sternum consistent with old rib fractures and rib cage distortion. Abd-  Br/ Gen/ Rectal- Not done, not indicated Extrem-  Neuro- +R corner of mouth droops

## 2024-02-04 ENCOUNTER — Ambulatory Visit: Payer: Medicare Other | Admitting: Internal Medicine

## 2024-02-19 ENCOUNTER — Ambulatory Visit: Attending: Cardiology | Admitting: Cardiology

## 2024-02-19 ENCOUNTER — Encounter: Payer: Self-pay | Admitting: Cardiology

## 2024-02-19 VITALS — BP 126/76 | HR 88 | Resp 16 | Ht 74.0 in | Wt 188.6 lb

## 2024-02-19 DIAGNOSIS — F1090 Alcohol use, unspecified, uncomplicated: Secondary | ICD-10-CM

## 2024-02-19 DIAGNOSIS — Z789 Other specified health status: Secondary | ICD-10-CM | POA: Diagnosis not present

## 2024-02-19 DIAGNOSIS — R7303 Prediabetes: Secondary | ICD-10-CM

## 2024-02-19 DIAGNOSIS — I10 Essential (primary) hypertension: Secondary | ICD-10-CM | POA: Diagnosis not present

## 2024-02-19 DIAGNOSIS — E7800 Pure hypercholesterolemia, unspecified: Secondary | ICD-10-CM

## 2024-02-19 DIAGNOSIS — I2510 Atherosclerotic heart disease of native coronary artery without angina pectoris: Secondary | ICD-10-CM | POA: Diagnosis not present

## 2024-02-19 NOTE — Progress Notes (Signed)
 Cardiology Office Note:  .   Date:  02/19/2024  ID:  Eduardo JONETTA Theo Mickey., DOB Sep 05, 1945, MRN [id number] PCP:  Okey Carlin Redbird, MD  Former Cardiology Providers: Dr. Candyce Reek [facility] Providers Cardiologist:  Madonna Large, DO , [facility] (established care 08/23/23) Electrophysiologist:  None  Click to update primary MD,subspecialty MD or APP then REFRESH:1}    Chief Complaint  Patient presents with   Coronary artery disease involving native coronary artery of   Follow-up    History of Present Illness: .   Eduardo Meckel. is a 78 y.o. Caucasian male whose past medical history and cardiovascular risk factors includes: CAD, prediabetes, hyperlipidemia, alcohol abuse, neuropathy.  Patient is being followed by the practice given his history of nonobstructive CAD and multiple cardiovascular risk factors.  At the last office visit shared decision was to proceed forward with cardiac PET/CT to evaluate for reversible ischemia as patient was noted to have new EKG changes.  Cardiac PET/CT is still pending.  In the interim he did follow-up with Glendia Ferrier, PA-C November 09, 2023 progress note reviewed.  Patient required a fasting lipid for prior authorization for Nexlizet  and was started on carvedilol  3.125 mg p.o. twice daily for blood pressure management.  Patient presents today for follow-up.  Echocardiogram notes preserved LVEF, grade 1 diastolic dysfunction and no significant valvular heart disease.  He has had difficulty arranging the cardiac PET/CT last several times.  Clinically denies anginal chest pain or heart failure symptoms.  Patient states that he still has not been authorized to start Nexlizet  out by his insurance company despite uncontrolled hyperlipidemia.  Ambulates at least 0.5 miles per day.   Has reduced his alcohol intake since January 2025 - cannot quantify the amount.  Remains at risk for falls given his peripheral neuropathy.  Review of Systems: .    Review of Systems  Cardiovascular:  Negative for chest pain, claudication, irregular heartbeat, leg swelling, near-syncope, orthopnea, palpitations, paroxysmal nocturnal dyspnea and syncope.  Respiratory:  Negative for shortness of breath.   Hematologic/Lymphatic: Negative for bleeding problem.  Musculoskeletal:  Positive for falls.    Studies Reviewed:   EKG: 08/23/2023: Sinus rhythm, 78 bpm, left axis, ST-T changes in the anteroseptal and anterior leads, consider ischemia.  When compared to prior tracing from 05/25/2022 anterior ST-T changes are new.  Echocardiogram: 2015: LVEF 60-65%.  See report for additional details  March 2025 1. Left ventricular ejection fraction, by estimation, is 55 to 60%. Left ventricular ejection fraction by 3D volume is 60 %. The left ventricle has normal function. The left ventricle has no regional wall motion abnormalities. There is mild left  ventricular hypertrophy. Left ventricular diastolic parameters are consistent with Grade I diastolic dysfunction (impaired relaxation). The average left ventricular global longitudinal strain is -22.9 %. The global longitudinal strain is normal. 2. Right ventricular systolic function is normal. The right ventricular size is normal. Tricuspid regurgitation signal is inadequate for assessing PA pressure. 3. The mitral valve is grossly normal. No evidence of mitral valve regurgitation. 4. The aortic valve is tricuspid. Aortic valve regurgitation is not visualized. Aortic valve sclerosis/calcification is present, without any evidence of aortic stenosis. 5. Aortic dilatation noted. There is borderline dilatation of the ascending aorta, measuring 39 mm. 6. The inferior vena cava is normal in size with greater than 50% respiratory variability, suggesting right atrial pressure of 3 mmHg.   RADIOLOGY: NA  Risk Assessment/Calculations:   N/A   Labs:    External  Labs: Collected: Dec 22, 2022 [facility] database. Total  cholesterol 254, triglycerides 125, HDL 85, LDL 147  Lab Results  Component Value Date   CHOL 269 (H) 11/09/2023   HDL 96 11/09/2023   LDLCALC 155 (H) 11/09/2023   LDLDIRECT 160 (H) 11/09/2023   TRIG 105 11/09/2023   CHOLHDL 2.8 11/09/2023      Latest Ref Rng & Units 12/21/2023   12:16 PM 12/21/2023    7:17 AM 08/09/2022    8:20 AM  CMP  Glucose 70 - 99 mg/dL 887  851  812   BUN 8 - 23 mg/dL 9  12  15    Creatinine 0.61 - 1.24 mg/dL 9.19  9.09  8.97   Sodium 135 - 145 mmol/L 132  125  138   Potassium 3.5 - 5.1 mmol/L 3.8  3.9  4.7   Chloride 98 - 111 mmol/L 99  93  103   CO2 22 - 32 mmol/L 19  16  25    Calcium  8.9 - 10.3 mg/dL 7.5  8.4  9.5   Total Protein 6.5 - 8.1 g/dL 6.8  7.5    Total Bilirubin 0.0 - 1.2 mg/dL 1.5  1.9    Alkaline Phos 38 - 126 U/L 73  81    AST 15 - 41 U/L 41  52    ALT 0 - 44 U/L 35  39        Physical Exam:    Today's Vitals   02/19/24 0745  BP: 126/76  Pulse: 88  Resp: 16  SpO2: 95%  Weight: 188 lb 9.6 oz (85.5 kg)  Height: 6' 2 (1.88 m)   Body mass index is 24.21 kg/m. Wt Readings from Last 3 Encounters:  02/19/24 188 lb 9.6 oz (85.5 kg)  12/21/23 190 lb (86.2 kg)  11/09/23 201 lb 3.2 oz (91.3 kg)    Physical Exam  Constitutional: No distress.  hemodynamically stable  Neck: No JVD present.  Cardiovascular: Normal rate, regular rhythm, S1 normal and S2 normal. Exam reveals no gallop, no S3 and no S4.  No murmur heard. Pulses:      Dorsalis pedis pulses are 2+ on the right side and 2+ on the left side.       Posterior tibial pulses are 2+ on the right side and 2+ on the left side.  Pulmonary/Chest: Effort normal and breath sounds normal. No stridor. He has no wheezes. He has no rales.  Abdominal: Soft. Bowel sounds are normal. He exhibits no distension. There is no abdominal tenderness.  Musculoskeletal:        General: No edema.     Cervical back: Neck supple.  Neurological: He is alert and oriented to person, place, and time. He  has intact cranial nerves (2-12).  Skin: Skin is warm.   Impression & Recommendation(s):  Impression:   ICD-10-CM   1. Coronary artery disease involving native coronary artery of native heart without angina pectoris  I25.10     2. Pure hypercholesterolemia  E78.00     3. Statin intolerance  Z78.9     4. Essential hypertension  I10     5. Prediabetes  R73.03     6. Alcohol use  F10.90         Recommendation(s):  Coronary artery disease involving native coronary artery of native heart without angina pectoris Last ischemic workup was in 2015 via heart catheterization-Per EMR noted to have RCA disease. Denies anginal chest pain or heart failure symptoms. His January EKG  noted ST-T changes that were new compared to prior tracings and therefore a cardiac PET/CT was recommended for further risk stratification.  However, he has had difficulty arranging the study several times.  Since he is asymptomatic shared decision was to hold off on additional testing at this time as the echocardiogram reassurance preserved LVEF, no regional wall motion normalities, and grade 1 diastolic dysfunction. Patient is advised to seek medical attention if he does develop new onset of chest pain or heart failure symptoms. His overall physical endurance remains relatively stable. He is making an effort to reduce alcohol consumption. Still not on lipid-lowering agents due to statin intolerance and insurance company has not approved Nexlizet . Will refer him to lipid clinic to help coordinate lipid management-consider PCSK9 inhibitors as well.  Pure hypercholesterolemia Statin intolerance LDL from May 2024: 147 mg/dL, not at goal. LDL April 2025 160 mg/dL, not at goal while on Zeita 10mg  po qday.  Patient carries a history of diabetes but states that he is currently prediabetic, will still target a goal LDL <70 mg/dL  Continue Zetia  10 mg p.o. daily. Will refer him to lipid clinic to help coordinate his care  with regards to initiation of either Nexlizet  or initiation PCSK9 inhibitors due to his CAD and hyperlipidemia.  Recommend checking fasting lipids and CMP in 6 weeks after starting therapy.  Benign essential hypertension: Tolerated carvedilol  3.125 mg p.o. twice daily Office at home blood pressures are well-controlled  Alcohol use Reemphasized importance of reducing alcohol intake to no more than 2 standard drinks per day.  Orders Placed:  No orders of the defined types were placed in this encounter.  Medical decision making: Discussed management of at least 2 chronic comorbid conditions. Reviewed labs from November 09, 2023. Reviewed the last progress note from [place], [place] November 09, 2023 Echocardiogram March 2025 Prescription drug management Referral to lipid clinic Coordination of care   Final Medication List:   No orders of the defined types were placed in this encounter.   There are no discontinued medications.   Current Outpatient Medications:    carvedilol  (COREG ) 3.125 MG tablet, Take 3.125 mg by mouth daily at 2 PM., Disp: , Rfl:    Cyanocobalamin (VITAMIN B-12 ER PO), Take 1 capsule by mouth daily at 2 PM., Disp: , Rfl:    escitalopram  (LEXAPRO ) 10 MG tablet, Take 1 tablet (10 mg total) by mouth daily., Disp: 30 tablet, Rfl: 0   ezetimibe  (ZETIA ) 10 MG tablet, Take 10 mg by mouth daily., Disp: , Rfl:    fluticasone  (FLONASE ) 50 MCG/ACT nasal spray, Place 1 spray into both nostrils daily. , Disp: , Rfl:    gabapentin  (NEURONTIN ) 100 MG capsule, Take 2 capsules (200 mg total) by mouth 2 (two) times daily., Disp: 120 capsule, Rfl: 0   hydrOXYzine  (ATARAX ) 10 MG tablet, Take 10 mg by mouth in the morning and at bedtime., Disp: , Rfl:    lactulose  (CHRONULAC ) 10 GM/15ML solution, Take 15 mLs (10 g total) by mouth 2 (two) times daily as needed for moderate constipation., Disp: 236 mL, Rfl: 0   Multiple Vitamin (MULTIVITAMIN WITH MINERALS) TABS tablet, Take 1 tablet by mouth  daily., Disp: 30 tablet, Rfl: 0   nystatin  (MYCOSTATIN /NYSTOP ) powder, Apply topically 2 (two) times daily as needed (For rash)., Disp: 15 g, Rfl: 0   tamsulosin (FLOMAX) 0.4 MG CAPS capsule, Take 0.4 mg by mouth daily after supper., Disp: , Rfl:    thiamine  (VITAMIN B-1) 100 MG tablet, Take  1 tablet (100 mg total) by mouth daily., Disp: 30 tablet, Rfl: 0   Thiamine  HCl (VITAMIN B1 PO), Take 1 capsule by mouth daily., Disp: , Rfl:   Consent:   N/A  Disposition:   1 year follow-up sooner if needed  His questions and concerns were addressed to his satisfaction. He voices understanding of the recommendations provided during this encounter.   Signed, Madonna Michele HAS, [facility]  A Division of [facility] [address] [place],  [address]  [place], [place] [address] 02/19/2024 8:33 AM

## 2024-02-19 NOTE — Patient Instructions (Signed)
 Medication Instructions:  No medication changes were made at this visit. Continue current regimen.   *If you need a refill on your cardiac medications before your next appointment, please call your pharmacy*  Lab Work: To be completed 6 weeks AFTER starting medication from PharmD: FASTING lipid panel, CMP   If you have labs (blood work) drawn today and your tests are completely normal, you will receive your results only by: MyChart Message (if you have MyChart) OR A paper copy in the mail If you have any lab test that is abnormal or we need to change your treatment, we will call you to review the results.  Testing/Procedures: None ordered today.  Follow-Up: At [facility], you and your health needs are our priority.  As part of our continuing mission to provide you with exceptional heart care, our providers are all part of one team.  This team includes your primary Cardiologist (physician) and Advanced Practice Providers or APPs (Physician Assistants and Nurse Practitioners) who all work together to provide you with the care you need, when you need it.  Your next appointment:   1 year(s)  Provider:   Madonna Large, DO    We recommend signing up for the patient portal called MyChart.  Sign up information is provided on this After Visit Summary.  MyChart is used to connect with patients for Virtual Visits (Telemedicine).  Patients are able to view lab/test results, encounter notes, upcoming appointments, etc.  Non-urgent messages can be sent to your provider as well.   To learn more about what you can do with MyChart, go to [email or web address].   Other Instructions You have been referred to PharmD for management of lipids. Someone will be reaching out to you to schedule this appointment in the near future.

## 2024-02-26 DIAGNOSIS — L853 Xerosis cutis: Secondary | ICD-10-CM | POA: Diagnosis not present

## 2024-02-26 DIAGNOSIS — L570 Actinic keratosis: Secondary | ICD-10-CM | POA: Diagnosis not present

## 2024-02-26 DIAGNOSIS — L821 Other seborrheic keratosis: Secondary | ICD-10-CM | POA: Diagnosis not present

## 2024-02-26 DIAGNOSIS — D692 Other nonthrombocytopenic purpura: Secondary | ICD-10-CM | POA: Diagnosis not present

## 2024-02-26 DIAGNOSIS — D1801 Hemangioma of skin and subcutaneous tissue: Secondary | ICD-10-CM | POA: Diagnosis not present

## 2024-02-29 ENCOUNTER — Ambulatory Visit: Admitting: Podiatry

## 2024-02-29 ENCOUNTER — Encounter: Payer: Self-pay | Admitting: Podiatry

## 2024-02-29 DIAGNOSIS — M79675 Pain in left toe(s): Secondary | ICD-10-CM

## 2024-02-29 DIAGNOSIS — B351 Tinea unguium: Secondary | ICD-10-CM

## 2024-02-29 DIAGNOSIS — M79674 Pain in right toe(s): Secondary | ICD-10-CM

## 2024-02-29 DIAGNOSIS — G629 Polyneuropathy, unspecified: Secondary | ICD-10-CM

## 2024-02-29 NOTE — Progress Notes (Signed)
 This patient returns to my office for at risk foot care.  This patient requires this care by a professional since this patient will be at risk due to having neuropathy.  Patient is taking neurontin. Patient was referred to this office by Dr.  Tenny Craw.  This patient is unable to cut nails himself since the patient cannot reach his nails.These nails are painful walking and wearing shoes.  This patient presents for at risk foot care today.  General Appearance  Alert, conversant and in no acute stress.  Vascular  Dorsalis pedis and posterior tibial  pulses are palpable  bilaterally.  Capillary return is within normal limits  bilaterally. Temperature is within normal limits  bilaterally.  Neurologic  Senn-Weinstein monofilament wire test diminished  bilaterally. Muscle power within normal limits bilaterally.  Nails Thick disfigured discolored nails with subungual debris  hallux nails  left.. No evidence of bacterial infection or drainage bilaterally.  Orthopedic  No limitations of motion  feet .  No crepitus or effusions noted.  No bony pathology or digital deformities noted.  HAV  right foot.  Skin  normotropic skin with no porokeratosis noted bilaterally.  No signs of infections or ulcers noted.     Onychomycosis  Pain in right toes  Pain in left toes  Consent was obtained for treatment procedures.   Mechanical debridement of nails 1-5  bilaterally performed with a nail nipper.  Filed with dremel without incident.  .       Return office visit  3   months                    Told patient to return for periodic foot care and evaluation due to potential at risk complications.   Helane Gunther DPM

## 2024-03-05 DIAGNOSIS — E782 Mixed hyperlipidemia: Secondary | ICD-10-CM | POA: Diagnosis not present

## 2024-03-05 DIAGNOSIS — E1140 Type 2 diabetes mellitus with diabetic neuropathy, unspecified: Secondary | ICD-10-CM | POA: Diagnosis not present

## 2024-03-05 DIAGNOSIS — I10 Essential (primary) hypertension: Secondary | ICD-10-CM | POA: Diagnosis not present

## 2024-03-12 DIAGNOSIS — I2510 Atherosclerotic heart disease of native coronary artery without angina pectoris: Secondary | ICD-10-CM | POA: Diagnosis not present

## 2024-03-12 DIAGNOSIS — Z8679 Personal history of other diseases of the circulatory system: Secondary | ICD-10-CM | POA: Diagnosis not present

## 2024-03-12 DIAGNOSIS — R2689 Other abnormalities of gait and mobility: Secondary | ICD-10-CM | POA: Diagnosis not present

## 2024-03-12 DIAGNOSIS — E782 Mixed hyperlipidemia: Secondary | ICD-10-CM | POA: Diagnosis not present

## 2024-03-12 DIAGNOSIS — L299 Pruritus, unspecified: Secondary | ICD-10-CM | POA: Diagnosis not present

## 2024-03-12 DIAGNOSIS — Z0000 Encounter for general adult medical examination without abnormal findings: Secondary | ICD-10-CM | POA: Diagnosis not present

## 2024-03-12 DIAGNOSIS — R778 Other specified abnormalities of plasma proteins: Secondary | ICD-10-CM | POA: Diagnosis not present

## 2024-03-12 DIAGNOSIS — E1142 Type 2 diabetes mellitus with diabetic polyneuropathy: Secondary | ICD-10-CM | POA: Diagnosis not present

## 2024-03-24 NOTE — Progress Notes (Unsigned)
 HPI male never smoker followed for cough, mild obstructive airways disease, complicated by CAD, palpitations, old right rib fractures PFT 07/13/14- minimal obstructive airways disease, insignificant response to bronchodilator, normal lung volumes and diffusion. Echocardiogram 08/11/2013-normal LV function, normal valves, no evidence of prior MI Walk Test-04/02/2015-he walked 555 feet starting with saturation 93%, falling to a nadir of 92% on room air with no oxygen  desaturation. Pulse rate 100-109, regular. ONOX- 04/25/15- qualified for sleep O2 with 50 minutes<= 88% room air .NPSG 09/29/15- WNL, AHI 3/ hr, with desat to 85%, mean sat 90.1% ----------------------------------------------------------------------    02/02/23- 78 year old male never smoker followed for Chronic hypoxic respiratory / Nocturnal Hypoxemia, cough, mild Obstructive Airways Disease, old right rib fractures complicated by CAD, palpitations, Insomnia/ Anxiet/ ETOH/Depression Dr Geoffry, DM2, Hyperlipidemia, Neuropathy O2 2 L/sleep/[employer]  Covid vax-3 Phizer Body weight today-191 lbs -----Breathing is good.  Uses O2 about 4 nights/ week- takes it off about 2 AM. Discussed. Lorazepam  helps sleep. Peripheral neuropathy discomfort impacts sleep.  Had TURP.  03/27/24- 78 year old male never smoker followed for Chronic hypoxic respiratory / Nocturnal Hypoxemia, cough, mild Obstructive Airways Disease, old right rib fractures complicated by CAD, palpitations, Insomnia/ Anxiet/ ETOH/Depression Dr Geoffry, DM2, Hyperlipidemia, Neuropathy -Lorazepam  for sleep,  O2 2 L/sleep/[employer]  Arrival O2 sat 96% room air Discussed the use of AI scribe software for clinical note transcription with the patient, who gave verbal consent to proceed.  History of Present Illness   Taje Littler. is a 78 year old male with asthma who presents for a follow-up regarding his nocturnal oxygen  use and breathing.  He uses oxygen  at night approximately  every other week due to the noise of the concentrator affecting his sleep. The frequency of use varies between three to five days, but not every night. I suggested he move the concentrator outside his bedroom to muffle the noise. There is no noticeable difference in his breathing or sleep quality with or without oxygen  use. He had asthma in childhood, which is relevant to his current pulmonary follow-up. He owns his concentrator now. If it should need to be replaced, he would need updated overnight oximetry.     Assessment and Plan:    Nocturnal Hypoxemia Managed with nocturnal oxygen  therapy. He used oxygen  intermittently due to noise disturbance without perceiving a difference in breathing or sleep quality. - Continue oxygen  therapy as needed based on comfort and sleep quality. -Anticipate another overnight oximetry in the future, but since he owns machine, we will wait. - Consider relocating concentrator  using a longer hose to reduce noise.      ROS-see HPI  + = positive Constitutional:   No-   weight loss, night sweats, fevers, chills, fatigue, lassitude. HEENT:   No-  headaches, difficulty swallowing, tooth/dental problems, sore throat,       No-  sneezing, itching, ear ache, nasal congestion, post nasal drip,  CV:  No-   chest pain, orthopnea, PND, swelling in lower extremities, anasarca,                              +dizziness, +palpitations Resp: +shortness of breath with exertion or at rest.              No-   productive cough,  non-productive cough,  No- coughing up of blood.              No-   change in color of mucus.  No-  wheezing.   Skin: No-   rash or lesions. GI:  No-   heartburn, indigestion, abdominal pain, nausea, vomiting,  GU: . MS:  No-   joint pain or swelling.   Neuro-     + numbness/ peripheral neuropathy Psych:  No- change in mood or affect. + depression or anxiety.  No memory loss.  OBJ- Physical Exam    General- Alert, Oriented, Affect-appropriate, Distress-  none acute, not obese Skin- rash-none, lesions- none, excoriation- none Lymphadenopathy- none Head- atraumatic            Eyes- Gross vision intact, PERRLA, conjunctivae and secretions clear            Ears- + cerumen obstructing right canal            Nose- Clear, no-Septal dev, mucus, polyps, erosion, perforation             Throat- Mallampati II/+ uvulopalatoplasty , mucosa clear , drainage- none, tonsils-  atrophic,  Neck- flexible , trachea midline, no stridor , thyroid  nl, carotid no bruit Chest - symmetrical excursion , unlabored           Heart/CV- RRR , no murmur , no gallop  , no rub, nl s1 s2                           - JVD- none , edema- none, stasis changes- none, varices- none           Lung- +clear, wheeze- none, cough- none , dullness-none, rub- none           Chest wall-; + asymmetry chest wall at sternum consistent with old rib fractures and rib cage distortion. Abd-  Br/ Gen/ Rectal- Not done, not indicated Extrem-  Neuro- +R corner of mouth droops

## 2024-03-27 ENCOUNTER — Ambulatory Visit: Admitting: Internal Medicine

## 2024-03-27 ENCOUNTER — Encounter: Payer: Self-pay | Admitting: Internal Medicine

## 2024-03-27 VITALS — BP 116/66 | HR 82 | Temp 98.1°F | Ht 74.0 in | Wt 191.8 lb

## 2024-03-27 DIAGNOSIS — R0902 Hypoxemia: Secondary | ICD-10-CM

## 2024-03-27 DIAGNOSIS — G4734 Idiopathic sleep related nonobstructive alveolar hypoventilation: Secondary | ICD-10-CM

## 2024-03-27 NOTE — Patient Instructions (Signed)
 Glad you are doing well  Ok to continue O2 2L for sleep. Please let us  know if anything changes or if we can be helpful

## 2024-04-03 ENCOUNTER — Ambulatory Visit: Admitting: Psychiatry

## 2024-04-03 ENCOUNTER — Ambulatory Visit: Attending: Cardiology | Admitting: Pharmacist

## 2024-04-03 ENCOUNTER — Encounter: Payer: Self-pay | Admitting: Pharmacist

## 2024-04-03 DIAGNOSIS — E782 Mixed hyperlipidemia: Secondary | ICD-10-CM | POA: Diagnosis not present

## 2024-04-03 MED ORDER — ROSUVASTATIN CALCIUM 10 MG PO TABS
10.0000 mg | ORAL_TABLET | Freq: Every day | ORAL | 3 refills | Status: AC
Start: 2024-04-03 — End: 2024-07-02

## 2024-04-03 MED ORDER — EZETIMIBE 10 MG PO TABS: 10.0000 mg | ORAL_TABLET | Freq: Every day | ORAL | 3 refills | Status: AC

## 2024-04-03 NOTE — Assessment & Plan Note (Addendum)
 Assessment:  LDL goal: < 70 mg/dl last LDLc 839 mg/dl (95/7974) Tolerated  Zetia  and Crestor  5 mg daily well in the past but currently not on any therapy  Intolerance to  simvastatin due to myalgia   Discussed next potential options (Statin, Zetia , PCSK-9 inhibitors, bempedoic acid and inclisiran); cost, dosing efficacy, side effects  Reiterated importance of healthy lifestyle  Expressed interest in trying oral therapies first before considering any injectable agents  Plan: Start taking Crestor  10 gm daily and Zetia  10 mg daily, f/u lab due on Nov 14,2025  F/u with me Nov 2025

## 2024-04-03 NOTE — Progress Notes (Signed)
 Patient ID: Eduardo Watts.                 DOB: Nov 17, 1945                    MRN: [id number]      HPI: Eduardo Watts. is a 78 y.o. male patient referred to lipid clinic by Dr.Tolia. PMH is significant for CAD, prediabetes, hyperlipidemia, alcohol abuse, neuropathy, hypertension,statin intolerance.    The patient presented today for evaluation in the lipid clinic. He has a history of diabetes (currently considered prediabetic) and elevated LDL cholesterol levels. His LDL was 147 mg/dL in May 2024 and increased to 160 mg/dL in April 2025 despite being on ezetimibe  10 mg daily, indicating he is not at goal. The target LDL-C remains <70 mg/dL due to his cardiometabolic risk. The patient reports that his PCP had previously told him his cholesterol "was not that bad," which led him to stop ezetimibe  a few weeks ago. He also recalls being on rosuvastatin  5 mg in the past, which he tolerated well but discontinued after being told his LDL was acceptable. His current lifestyle includes walking  to 1 mile daily, and he admits to eating freely without dietary restrictions; his appetite is reportedly small.   During today's visit, we reviewed available options for LDL-lowering therapy, including statins, ezetimibe , PCSK9 inhibitors, bempedoic acid, and inclisiran. Mechanisms of action, dosing regimens, side effects, anticipated LDL reductions, cost considerations, and patient assistance options were discussed in detail. The patient expressed interest in trying oral therapies first before considering any injectable agents. Intolerances: rosuvastatin  simvastatin - myalgia  Risk Factors: CAD, diabetes, hyperlipidemia, alcohol abuse, family hx of ASCVD  LDL goal: <70 mg/dl TG <849 mg/dl  Last lab 95/7974 TC 730, TG 105, HDL 96, LDLc 155 Direct LDLc 160.  Diet: eat well,  B- skips, coffee with small cookie  L- fried chicken, fried fish, salads  D- rare  Eats ice cream in the evening    Exercise:  walk 1/2 mile once a day   Family History:  Relation Problem Comments  Mother (Deceased at age 45) Dementia   Stroke     Father (Deceased at age 34) Cancer - Prostate   Melanoma     Sister Metallurgist) Hyperlipidemia high cholestrol    Brother (Deceased) Depression   Suicidality     Maternal Grandfather Lung disease     Social History:  Alcohol: 2 glasses of wine per day  Smoking: none   Labs:  Lipid Panel     Component Value Date/Time   CHOL 269 (H) 11/09/2023 0850   TRIG 105 11/09/2023 0850   HDL 96 11/09/2023 0850   CHOLHDL 2.8 11/09/2023 0850   LDLCALC 155 (H) 11/09/2023 0850   LDLDIRECT 160 (H) 11/09/2023 0850   LABVLDL 18 11/09/2023 0850    Past Medical History:  Diagnosis Date   Anxiety    Asthma    as a child   Depression    sees Dr Geoffry   History of BPH    Dr.Dahlstedt   Hyperlipidemia    Pre-diabetes    Prediabetes    Sleep disturbances     Current Outpatient Medications on File Prior to Visit  Medication Sig Dispense Refill   carvedilol  (COREG ) 3.125 MG tablet Take 3.125 mg by mouth daily at 2 PM.     Cyanocobalamin (VITAMIN B-12 ER PO) Take 1 capsule by mouth daily at 2 PM.     escitalopram  (  LEXAPRO ) 10 MG tablet Take 1 tablet (10 mg total) by mouth daily. 30 tablet 0   fluticasone  (FLONASE ) 50 MCG/ACT nasal spray Place 1 spray into both nostrils daily.      gabapentin  (NEURONTIN ) 100 MG capsule Take 2 capsules (200 mg total) by mouth 2 (two) times daily. 120 capsule 0   hydrOXYzine  (ATARAX ) 10 MG tablet Take 10 mg by mouth in the morning and at bedtime.     Multiple Vitamin (MULTIVITAMIN WITH MINERALS) TABS tablet Take 1 tablet by mouth daily. 30 tablet 0   nystatin  (MYCOSTATIN /NYSTOP ) powder Apply topically 2 (two) times daily as needed (For rash). 15 g 0   thiamine  (VITAMIN B-1) 100 MG tablet Take 1 tablet (100 mg total) by mouth daily. 30 tablet 0   No current facility-administered medications on file prior to visit.    Allergies   Allergen Reactions   Simvastatin Other (See Comments)    Muscle aches   Lorazepam  Other (See Comments)    Dizzy/unsteady   Trazodone And Nefazodone Other (See Comments)    crazy    Assessment/Plan:  1. Hyperlipidemia -  Problem  Mixed Hyperlipidemia   Current medication: none  Intolerances: rosuvastatin  simvastatin - myalgia  Risk Factors: CAD, diabetes, hyperlipidemia, alcohol abuse, family hx of ASCVD  LDL goal: <70 mg/dl TG <849 mg/dl  Last lab 95/7974 TC 730, TG 105, HDL 96, LDLc 155 Direct LDLc 160- off of meds     Mixed hyperlipidemia Assessment:  LDL goal: < 70 mg/dl last LDLc 839 mg/dl (95/7974) Tolerated  Zetia  and Crestor  5 mg daily well in the past but currently not on any therapy  Intolerance to  simvastatin due to myalgia   Discussed next potential options (Statin, Zetia , PCSK-9 inhibitors, bempedoic acid and inclisiran); cost, dosing efficacy, side effects  Reiterated importance of healthy lifestyle  Expressed interest in trying oral therapies first before considering any injectable agents  Plan: Start taking Crestor  10 gm daily and Zetia  10 mg daily, f/u lab due on Nov 14,2025  F/u with me Nov 2025     Thank you,  Robbi Blanch, Pharm.D Ovilla [name], [name], [name]; Fax: [phone]

## 2024-04-03 NOTE — Patient Instructions (Signed)
 Your Results:             Your most recent labs Goal  Total Cholesterol 269 < 200  Triglycerides 105 < 150  HDL (happy/good cholesterol) 96 > 40  LDL (lousy/bad cholesterol 160 < 70   Medication changes: start taking ezetimibe  10 mg daily and rosuvastatin  10 mg daily   Praluent is a cholesterol medication that improved your body's ability to get rid of bad cholesterol known as LDL. It can lower your LDL up to 60%. It is an injection that is given under the skin every 2 weeks. The most common side effects of Praluent include runny nose, symptoms of the common cold, rarely flu or flu-like symptoms, back/muscle pain in about 3-4% of the patients, and redness, pain, or bruising at the injection site.    Repatha is a cholesterol medication that improved your body's ability to get rid of bad cholesterol known as LDL. It can lower your LDL up to 60%! It is an injection that is given under the skin every 2 weeks. The medication often requires a prior authorization from your insurance company. The most common side effects of Repatha include runny nose, symptoms of the common cold, rarely flu or flu-like symptoms, back/muscle pain in about 3-4% of the patients, and redness, pain, or bruising at the injection site.   Lab orders: We want to repeat labs after 2-3 months.  We will send you a lab order to remind you once we get closer to that time.

## 2024-05-08 ENCOUNTER — Ambulatory Visit: Admitting: Psychiatry

## 2024-05-14 DIAGNOSIS — T148XXA Other injury of unspecified body region, initial encounter: Secondary | ICD-10-CM | POA: Diagnosis not present

## 2024-05-14 DIAGNOSIS — G479 Sleep disorder, unspecified: Secondary | ICD-10-CM | POA: Diagnosis not present

## 2024-05-14 DIAGNOSIS — R296 Repeated falls: Secondary | ICD-10-CM | POA: Diagnosis not present

## 2024-05-14 DIAGNOSIS — M5416 Radiculopathy, lumbar region: Secondary | ICD-10-CM | POA: Diagnosis not present

## 2024-05-29 ENCOUNTER — Ambulatory Visit: Attending: Family Medicine

## 2024-05-29 DIAGNOSIS — R252 Cramp and spasm: Secondary | ICD-10-CM | POA: Insufficient documentation

## 2024-05-29 DIAGNOSIS — M5416 Radiculopathy, lumbar region: Secondary | ICD-10-CM | POA: Insufficient documentation

## 2024-05-29 DIAGNOSIS — M5459 Other low back pain: Secondary | ICD-10-CM | POA: Insufficient documentation

## 2024-05-29 DIAGNOSIS — M6281 Muscle weakness (generalized): Secondary | ICD-10-CM | POA: Insufficient documentation

## 2024-05-29 DIAGNOSIS — R262 Difficulty in walking, not elsewhere classified: Secondary | ICD-10-CM | POA: Insufficient documentation

## 2024-05-29 DIAGNOSIS — R293 Abnormal posture: Secondary | ICD-10-CM | POA: Insufficient documentation

## 2024-05-29 NOTE — Therapy (Incomplete)
 OUTPATIENT PHYSICAL THERAPY THORACOLUMBAR EVALUATION   Patient Name: Eduardo Watts. MRN: [id number] DOB:06/14/1946, 78 y.o., male Today's Date: 05/29/2024  END OF SESSION:   Past Medical History:  Diagnosis Date   Anxiety    Asthma    as a child   Depression    sees Dr Geoffry   History of BPH    Dr.Dahlstedt   Hyperlipidemia    Pre-diabetes    Prediabetes    Sleep disturbances    Past Surgical History:  Procedure Laterality Date   CIRCUMCISION     LEFT HEART CATHETERIZATION WITH CORONARY ANGIOGRAM N/A 04/30/2014   Procedure: LEFT HEART CATHETERIZATION WITH CORONARY ANGIOGRAM;  Surgeon: Candyce GORMAN Reek, MD;  Location: [facility];  Service: Cardiovascular;  Laterality: N/A;   NASAL SEPTUM SURGERY  1975   R cheek benign tumor      1992, again 1998.   TONSILLECTOMY     TRANSURETHRAL RESECTION OF PROSTATE N/A 08/17/2022   Procedure: TRANSURETHRAL RESECTION OF THE PROSTATE (TURP);  Surgeon: Matilda Senior, MD;  Location: [facility];  Service: Urology;  Laterality: N/A;  90 MINS   Patient Active Problem List   Diagnosis Date Noted   MDD (major depressive disorder), recurrent severe, without psychosis (HCC) 12/21/2023   Generalized anxiety disorder 12/21/2023   Benzodiazepine abuse (HCC) 12/21/2023   MDD (major depressive disorder) 12/21/2023   BPH with obstruction/lower urinary tract symptoms 08/17/2022   Cerumen impaction 01/31/2022   Pain due to onychomycosis of toenails of both feet 01/21/2021   Multiple fractures of ribs, right side, initial encounter for closed fracture 10/29/2020   Conductive hearing loss of left ear with unrestricted hearing of right ear 11/05/2019   Impacted cerumen of left ear 11/05/2019   Peripheral polyneuropathy 04/15/2019   GERD (gastroesophageal reflux disease) 10/08/2018   Prediabetes 11/08/2016   Alcohol use 07/13/2015   Dyspnea on exertion 04/02/2015   Insomnia 04/02/2015   Palpitations 01/27/2015   Nocturnal hypoxia  01/27/2015   Right-sided chest wall pain 11/26/2014   Hypotension 09/08/2014   Cough 06/09/2014   Coronary atherosclerosis of native coronary artery 05/06/2014   Nonspecific abnormal results of cardiovascular function study 04/21/2014   Mixed hyperlipidemia 07/28/2013   Nonspecific abnormal electrocardiogram (ECG) (EKG) 07/28/2013    PCP: Okey Carlin Redbird, MD  REFERRING PROVIDER: Okey Carlin Redbird, MD  REFERRING DIAG: M54.16 (ICD-10-CM) - Radiculopathy, lumbar region R29.6 (ICD-10-CM) - Frequent falls  Rationale for Evaluation and Treatment: Rehabilitation  THERAPY DIAG:  Other low back pain  Radiculopathy, lumbar region  Muscle weakness (generalized)  Cramp and spasm  Difficulty in walking, not elsewhere classified  Abnormal posture  ONSET DATE: 05/19/2024  SUBJECTIVE:  SUBJECTIVE STATEMENT: ***  PERTINENT HISTORY:  ***  PAIN:  Are you having pain? Yes: NPRS scale: *** Pain location: *** Pain description: *** Aggravating factors: *** Relieving factors: ***  PRECAUTIONS: Fall  RED FLAGS: None   WEIGHT BEARING RESTRICTIONS: No  FALLS:  Has patient fallen in last 6 months? Yes. Number of falls ***  LIVING ENVIRONMENT: Lives with: lives with their family Lives in: House/apartment Stairs: Yes: Internal: 15 steps; on right going up and External: 4 steps; on right going up Has following equipment at home: ***  OCCUPATION: ***  PLOF: {PLOF:24004}  PATIENT GOALS: ***  NEXT MD VISIT: ***  OBJECTIVE:  Note: Objective measures were completed at Evaluation unless otherwise noted.  DIAGNOSTIC FINDINGS:  ***  PATIENT SURVEYS:  Modified Oswestry:  MODIFIED OSWESTRY DISABILITY SCALE  Date: 05/29/24 Score  Pain intensity {ODI 1:32962}  2. Personal care (washing,  dressing, etc.) {ODI 2:32963}  3. Lifting {ODI 3:32964}  4. Walking {ODI 4:32965}  5. Sitting {ODI 5:32966}  6. Standing {ODI 6:32967}  7. Sleeping {ODI 7:32968}  8. Social Life {ODI 8:32969}  9. Traveling {ODI 9:32970}  10. Employment/ Homemaking {ODI 10:32971}  Total ***/50   Interpretation of scores: Score Category Description  0-20% Minimal Disability The patient can cope with most living activities. Usually no treatment is indicated apart from advice on lifting, sitting and exercise  21-40% Moderate Disability The patient experiences more pain and difficulty with sitting, lifting and standing. Travel and social life are more difficult and they may be disabled from work. Personal care, sexual activity and sleeping are not grossly affected, and the patient can usually be managed by conservative means  41-60% Severe Disability Pain remains the main problem in this group, but activities of daily living are affected. These patients require a detailed investigation  61-80% Crippled Back pain impinges on all aspects of the patient's life. Positive intervention is required  81-100% Bed-bound These patients are either bed-bound or exaggerating their symptoms  Bluford FORBES Zoe DELENA Karon DELENA, et al. Surgery versus conservative management of stable thoracolumbar fracture: the PRESTO feasibility RCT. [place]): [employer]; 2021 Nov. [facility] Technology Assessment, No. 25.62.) Appendix 3, Oswestry Disability Index category descriptors. Available from: FindJewel[employer].cz  Minimally Clinically Important Difference (MCID) = 12.8%  COGNITION: Overall cognitive status: {cognition:24006}     SENSATION: {sensation:27233}  MUSCLE LENGTH: Hamstrings: Right *** deg; Left *** deg Debby test: Right *** deg; Left *** deg  POSTURE: {posture:25561}  PALPATION: ***  LUMBAR ROM:   AROM eval  Flexion   Extension   Right lateral flexion   Left lateral  flexion   Right rotation   Left rotation    (Blank rows = not tested)  LOWER EXTREMITY ROM:     WFL  LOWER EXTREMITY MMT:    MMT Right eval Left eval  Hip flexion    Hip extension    Hip abduction    Hip adduction    Hip internal rotation    Hip external rotation    Knee flexion    Knee extension    Ankle dorsiflexion    Ankle plantarflexion    Ankle inversion    Ankle eversion     (Blank rows = not tested)  LUMBAR SPECIAL TESTS:  Straight leg raise test: {pos/neg:25243}  FUNCTIONAL TESTS:  5 times sit to stand: *** Timed up and go (TUG): ***  GAIT: Distance walked: *** Assistive device utilized: {Assistive devices:23999} Level of assistance: {Levels of assistance:24026} Comments: ***  TREATMENT DATE:  05/29/24 Initial eval completed and initiated HEP                                                                                                                                 PATIENT EDUCATION:  Education details: *** Person educated: {Person educated:25204} Education method: {Education Method:25205} Education comprehension: {Education Comprehension:25206}  HOME EXERCISE PROGRAM: ***  ASSESSMENT:  CLINICAL IMPRESSION: Patient is a 78 y.o. male who was seen today for physical therapy evaluation and treatment for low back pain and radiculopathy.   OBJECTIVE IMPAIRMENTS: {opptimpairments:25111}.   ACTIVITY LIMITATIONS: {activitylimitations:27494}  PARTICIPATION LIMITATIONS: {participationrestrictions:25113}  PERSONAL FACTORS: {Personal factors:25162} are also affecting patient's functional outcome.   REHAB POTENTIAL: {rehabpotential:25112}  CLINICAL DECISION MAKING: {clinical decision making:25114}  EVALUATION COMPLEXITY: {Evaluation complexity:25115}   GOALS: Goals reviewed with patient? Yes  SHORT TERM GOALS: Target date: 06/26/2024  Pain report to be no greater than 4/10  Baseline: Goal status: INITIAL  2.  Patient will be  independent with initial HEP  Baseline:  Goal status: INITIAL  3.  *** Baseline:  Goal status: INITIAL  4.  *** Baseline:  Goal status: INITIAL  5.  *** Baseline:  Goal status: INITIAL  6.  *** Baseline:  Goal status: INITIAL  LONG TERM GOALS: Target date: 07/24/2024  Patient to report pain no greater than 2/10  Baseline:  Goal status: INITIAL  2.  Patient to be independent with advanced HEP  Baseline:  Goal status: INITIAL  3.  *** Baseline:  Goal status: INITIAL  4.  *** Baseline:  Goal status: INITIAL  5.  *** Baseline:  Goal status: INITIAL  6.  *** Baseline:  Goal status: INITIAL  PLAN:  PT FREQUENCY: 1-2x/week  PT DURATION: 8 weeks  PLANNED INTERVENTIONS: 97110-Therapeutic exercises, 97530- Therapeutic activity, [id number]- Neuromuscular re-education, 97535- Self Care, 02859- Manual therapy, [id number]- Gait training, [phone]- Canalith repositioning, [id number]- Aquatic Therapy, H9716- Electrical stimulation (unattended), [id number]- Electrical stimulation (manual), [id number]- Vasopneumatic device, [id number]- Ultrasound, [id number]- Traction (mechanical), [id number]- Ionotophoresis 4mg /ml Dexamethasone , 79439 (1-2 muscles), 20561 (3+ muscles)- Dry Needling, Patient/Family education, Balance training, Stair training, Taping, Joint mobilization, Spinal mobilization, Vestibular training, DME instructions, Cryotherapy, and Moist heat.  PLAN FOR NEXT SESSION: Nustep, review HEP   Delon KATHEE Haddock, PT 05/29/2024, 8:02 AM

## 2024-06-13 ENCOUNTER — Encounter: Payer: Self-pay | Admitting: Podiatry

## 2024-06-13 ENCOUNTER — Ambulatory Visit: Admitting: Podiatry

## 2024-06-13 DIAGNOSIS — M79674 Pain in right toe(s): Secondary | ICD-10-CM

## 2024-06-13 DIAGNOSIS — B351 Tinea unguium: Secondary | ICD-10-CM | POA: Diagnosis not present

## 2024-06-13 DIAGNOSIS — G629 Polyneuropathy, unspecified: Secondary | ICD-10-CM

## 2024-06-13 DIAGNOSIS — M79675 Pain in left toe(s): Secondary | ICD-10-CM

## 2024-06-13 NOTE — Progress Notes (Signed)
 This patient returns to my office for at risk foot care.  This patient requires this care by a professional since this patient will be at risk due to having neuropathy.  Patient is taking neurontin. Patient was referred to this office by Dr.  Tenny Craw.  This patient is unable to cut nails himself since the patient cannot reach his nails.These nails are painful walking and wearing shoes.  This patient presents for at risk foot care today.  General Appearance  Alert, conversant and in no acute stress.  Vascular  Dorsalis pedis and posterior tibial  pulses are palpable  bilaterally.  Capillary return is within normal limits  bilaterally. Temperature is within normal limits  bilaterally.  Neurologic  Senn-Weinstein monofilament wire test diminished  bilaterally. Muscle power within normal limits bilaterally.  Nails Thick disfigured discolored nails with subungual debris  hallux nails  left.. No evidence of bacterial infection or drainage bilaterally.  Orthopedic  No limitations of motion  feet .  No crepitus or effusions noted.  No bony pathology or digital deformities noted.  HAV  right foot.  Skin  normotropic skin with no porokeratosis noted bilaterally.  No signs of infections or ulcers noted.     Onychomycosis  Pain in right toes  Pain in left toes  Consent was obtained for treatment procedures.   Mechanical debridement of nails 1-5  bilaterally performed with a nail nipper.  Filed with dremel without incident.  .       Return office visit  3   months                    Told patient to return for periodic foot care and evaluation due to potential at risk complications.   Helane Gunther DPM

## 2024-06-26 ENCOUNTER — Ambulatory Visit: Attending: Cardiovascular Disease | Admitting: Pharmacist

## 2024-06-26 ENCOUNTER — Telehealth: Payer: Self-pay | Admitting: Pharmacy Technician

## 2024-06-26 ENCOUNTER — Telehealth: Payer: Self-pay | Admitting: Pharmacist

## 2024-06-26 ENCOUNTER — Encounter: Payer: Self-pay | Admitting: Pharmacist

## 2024-06-26 ENCOUNTER — Other Ambulatory Visit: Payer: Self-pay | Admitting: Pharmacist

## 2024-06-26 ENCOUNTER — Other Ambulatory Visit (HOSPITAL_COMMUNITY): Payer: Self-pay

## 2024-06-26 DIAGNOSIS — E782 Mixed hyperlipidemia: Secondary | ICD-10-CM | POA: Diagnosis not present

## 2024-06-26 MED ORDER — REPATHA SURECLICK 140 MG/ML ~~LOC~~ SOAJ: 140.0000 mg | SUBCUTANEOUS | 3 refills | Status: AC

## 2024-06-26 NOTE — Telephone Encounter (Signed)
   Pharmacy Patient Advocate Encounter   Received notification from Pt Calls Messages that prior authorization for repatha is required/requested.   Insurance verification completed.   The patient is insured through [employer].   Per test claim: PA required; PA submitted to above mentioned insurance via Latent Key/confirmation #/EOC [id number] Status is pending

## 2024-06-26 NOTE — Progress Notes (Signed)
 Patient ID: Eduardo Watts.                 DOB: [birth year]                    MRN: [id number]      HPI: Eduardo Watts. is a 78 y.o. male patient referred to lipid clinic by Dr.Tolia. PMH is significant for CAD, prediabetes, hyperlipidemia, alcohol abuse, neuropathy, hypertension,statin intolerance.   At last visit with me in Aug patient selected tot ry low dose rosuvastatin  and Zetia  over injectable therapy.  Today patient presented in good spirit. Will get updated lab(lipid and LFT) to see how effective the current therapy. we reviewed other available options for LDL-lowering therapy, including PCSK9I inhibitors, bempedoic acid, and inclisiran. Mechanisms of action, dosing regimens, side effects, anticipated LDL reductions, cost considerations, and patient assistance options were discussed in detail.   Reports he has lot less energy lately and he is suspecting he might have some form of parasitic infection. Advised him to talk to PCP   Current meds: rosuvastatin  10 mg daily and ezetimibe  10 mg daily  Intolerances: rosuvastatin  simvastatin - myalgia  Risk Factors: CAD, diabetes, hyperlipidemia, alcohol abuse, family hx of ASCVD  LDL goal: <70 mg/dl TG <849 mg/dl  Last lab 95/7974 TC 730, TG 105, HDL 96, LDLc 155 Direct LDLc 160.  Diet: eat well,  B- skips, coffee with small cookie  L- fried chicken, fried fish, salads  D- rare  Eats ice cream in the evening    Exercise: walk 1/2 mile once a day   Family History:  Relation Problem Comments  Mother (Deceased at age 54) Dementia   Stroke     Father (Deceased at age 32) Cancer - Prostate   Melanoma     Sister Metallurgist) Hyperlipidemia high cholestrol    Brother (Deceased) Depression   Suicidality     Maternal Grandfather Lung disease     Social History:  Alcohol: 2 glasses of wine per day  Smoking: none   Labs:  Lipid Panel     Component Value Date/Time   CHOL 269 (H) 11/09/2023 0850   TRIG 105 11/09/2023  0850   HDL 96 11/09/2023 0850   CHOLHDL 2.8 11/09/2023 0850   LDLCALC 155 (H) 11/09/2023 0850   LDLDIRECT 160 (H) 11/09/2023 0850   LABVLDL 18 11/09/2023 0850    Past Medical History:  Diagnosis Date   Anxiety    Asthma    as a child   Depression    sees Dr Geoffry   History of BPH    Dr.Dahlstedt   Hyperlipidemia    Pre-diabetes    Prediabetes    Sleep disturbances     Current Outpatient Medications on File Prior to Visit  Medication Sig Dispense Refill   carvedilol  (COREG ) 3.125 MG tablet Take 3.125 mg by mouth daily at 2 PM.     Cyanocobalamin (VITAMIN B-12 ER PO) Take 1 capsule by mouth daily at 2 PM.     escitalopram  (LEXAPRO ) 10 MG tablet Take 1 tablet (10 mg total) by mouth daily. 30 tablet 0   ezetimibe  (ZETIA ) 10 MG tablet Take 1 tablet (10 mg total) by mouth daily. 90 tablet 3   fluticasone  (FLONASE ) 50 MCG/ACT nasal spray Place 1 spray into both nostrils daily.      gabapentin  (NEURONTIN ) 100 MG capsule Take 2 capsules (200 mg total) by mouth 2 (two) times daily. 120 capsule 0   hydrOXYzine  (ATARAX )  10 MG tablet Take 10 mg by mouth in the morning and at bedtime.     Multiple Vitamin (MULTIVITAMIN WITH MINERALS) TABS tablet Take 1 tablet by mouth daily. 30 tablet 0   nystatin  (MYCOSTATIN /NYSTOP ) powder Apply topically 2 (two) times daily as needed (For rash). 15 g 0   rosuvastatin  (CRESTOR ) 10 MG tablet Take 1 tablet (10 mg total) by mouth daily. 90 tablet 3   thiamine  (VITAMIN B-1) 100 MG tablet Take 1 tablet (100 mg total) by mouth daily. 30 tablet 0   No current facility-administered medications on file prior to visit.    Allergies  Allergen Reactions   Simvastatin Other (See Comments)    Muscle aches   Lorazepam  Other (See Comments)    Dizzy/unsteady   Trazodone And Nefazodone Other (See Comments)    crazy    Assessment/Plan:  1. Hyperlipidemia -  Problem  Mixed Hyperlipidemia   Current medication: rosuvastatin  10 mg daily  and Zetia  10 mg  daily Intolerances: rosuvastatin  20 mg  simvastatin - myalgia  Risk Factors: CAD, diabetes, hyperlipidemia, alcohol abuse, family hx of ASCVD  LDL goal: <70 mg/dl TG <849 mg/dl  Last lab 95/7974 TC 730, TG 105, HDL 96, LDLc 155 Direct LDLc 160- off of meds      Mixed hyperlipidemia Assessment:  LDL goal: < 70 mg/dl last LDLc 839 mg/dl (95/7974) off of the therapy Tolerated  Zetia  and Crestor  10 mg daily well Intolerance to rosuvastatin  20 mg and  simvastatin due to myalgia   Discussed next potential options ( PCSK-9 inhibitors, bempedoic acid and inclisiran); cost, dosing efficacy, side effects  Reiterated importance of healthy lifestyle  Will get updated LFT and lipid on current moderate intensity statin and Zetia    Plan: Continue taking rosuvastatin  10 mg and Zetia  10 mg daily Will get FLP and LFT  If LDLc remain above the goal will add PCSK9i to optimize LDL to goal     Thank you,  Robbi Blanch, Pharm.D [place] [name] [facility] [address], [place], [place] [address] Phone: [phone]; Fax: [phone]

## 2024-06-26 NOTE — Assessment & Plan Note (Signed)
 Assessment:  LDL goal: < 70 mg/dl last LDLc 839 mg/dl (95/7974) off of the therapy Tolerated  Zetia  and Crestor  10 mg daily well Intolerance to rosuvastatin  20 mg and  simvastatin due to myalgia   Discussed next potential options ( PCSK-9 inhibitors, bempedoic acid and inclisiran); cost, dosing efficacy, side effects  Reiterated importance of healthy lifestyle  Will get updated LFT and lipid on current moderate intensity statin and Zetia    Plan: Continue taking rosuvastatin  10 mg and Zetia  10 mg daily Will get FLP and LFT  If LDLc remain above the goal will add PCSK9i to optimize LDL to goal

## 2024-06-26 NOTE — Telephone Encounter (Signed)
 PA for Repatha approved call pt to inform - N/A LVM

## 2024-06-26 NOTE — Telephone Encounter (Signed)
 Pharmacy Patient Advocate Encounter  Received notification from [employer] that Prior Authorization for repatha  has been APPROVED from 06/26/24 to 12/24/24. Ran test claim, Copay is $187.69. This test claim was processed through [facility]- copay amounts may vary at other pharmacies due to pharmacy/plan contracts, or as the patient moves through the different stages of their insurance plan.   PA #/Case ID/Reference #: [id number]

## 2024-06-27 LAB — LIPID PANEL
Chol/HDL Ratio: 2 ratio (ref 0.0–5.0)
Cholesterol, Total: 178 mg/dL (ref 100–199)
HDL: 87 mg/dL (ref >39)
LDL Chol Calc (NIH): 66 mg/dL (ref 0–99)
Triglycerides: 150 mg/dL — ABNORMAL HIGH (ref 0–149)
VLDL Cholesterol Cal: 25 mg/dL (ref 5–40)

## 2024-06-27 LAB — HEPATIC FUNCTION PANEL
ALT: 29 IU/L (ref 0–44)
AST: 52 IU/L — ABNORMAL HIGH (ref 0–40)
Albumin: 4.5 g/dL (ref 3.8–4.8)
Alkaline Phosphatase: 121 IU/L (ref 47–123)
Bilirubin Total: 1.2 mg/dL (ref 0.0–1.2)
Bilirubin, Direct: 0.54 mg/dL — ABNORMAL HIGH (ref 0.00–0.40)
Total Protein: 7.1 g/dL (ref 6.0–8.5)

## 2024-07-10 ENCOUNTER — Ambulatory Visit: Payer: Self-pay | Admitting: Pharmacist

## 2024-07-10 NOTE — Telephone Encounter (Signed)
 Lipid lab result discussed over the phone - LDLc at goal on Crestor  10 mg and Zetia  10 mg daily advised to continue taking it and we will repeat lipid lab Feb 23,2026 Repatha  PA approved till 12/2024 so if LDL come back elevated will add that to current therapy

## 2024-09-12 ENCOUNTER — Ambulatory Visit: Admitting: Podiatry
